# Patient Record
Sex: Male | Born: 1960 | Race: White | Hispanic: No | Marital: Single | Smoking: Never smoker
Health system: Southern US, Community
[De-identification: ages and names within clinical notes are randomized; demographics above are authoritative.]

## PROBLEM LIST (undated history)

## (undated) DIAGNOSIS — F32A Depression, unspecified: Secondary | ICD-10-CM

## (undated) DIAGNOSIS — I10 Essential (primary) hypertension: Secondary | ICD-10-CM

## (undated) DIAGNOSIS — F329 Major depressive disorder, single episode, unspecified: Secondary | ICD-10-CM

## (undated) DIAGNOSIS — R351 Nocturia: Secondary | ICD-10-CM

## (undated) DIAGNOSIS — N529 Male erectile dysfunction, unspecified: Secondary | ICD-10-CM

## (undated) DIAGNOSIS — J069 Acute upper respiratory infection, unspecified: Secondary | ICD-10-CM

## (undated) DIAGNOSIS — I639 Cerebral infarction, unspecified: Secondary | ICD-10-CM

## (undated) HISTORY — DX: Male erectile dysfunction, unspecified: N52.9

## (undated) HISTORY — DX: Acute upper respiratory infection, unspecified: J06.9

## (undated) HISTORY — PX: SPINE SURGERY: SHX786

## (undated) HISTORY — DX: Nocturia: R35.1

## (undated) HISTORY — DX: Major depressive disorder, single episode, unspecified: F32.9

## (undated) HISTORY — PX: APPENDECTOMY: SHX54

## (undated) HISTORY — DX: Essential (primary) hypertension: I10

## (undated) HISTORY — DX: Depression, unspecified: F32.A

---

## 1999-10-29 ENCOUNTER — Encounter: Admission: RE | Admit: 1999-10-29 | Discharge: 1999-10-29 | Payer: Self-pay | Admitting: Orthopedic Surgery

## 1999-10-29 ENCOUNTER — Encounter: Payer: Self-pay | Admitting: Orthopedic Surgery

## 2002-03-21 ENCOUNTER — Encounter: Payer: Self-pay | Admitting: Sports Medicine

## 2002-03-21 ENCOUNTER — Ambulatory Visit (HOSPITAL_COMMUNITY): Admission: RE | Admit: 2002-03-21 | Discharge: 2002-03-21 | Payer: Self-pay | Admitting: Sports Medicine

## 2002-04-01 ENCOUNTER — Encounter: Payer: Self-pay | Admitting: Neurological Surgery

## 2002-04-01 ENCOUNTER — Ambulatory Visit (HOSPITAL_COMMUNITY): Admission: RE | Admit: 2002-04-01 | Discharge: 2002-04-02 | Payer: Self-pay | Admitting: Neurological Surgery

## 2005-01-18 ENCOUNTER — Emergency Department (HOSPITAL_COMMUNITY): Admission: EM | Admit: 2005-01-18 | Discharge: 2005-01-18 | Payer: Self-pay | Admitting: Family Medicine

## 2006-11-20 ENCOUNTER — Encounter: Admission: RE | Admit: 2006-11-20 | Discharge: 2006-11-20 | Payer: Self-pay | Admitting: Family Medicine

## 2008-01-31 ENCOUNTER — Emergency Department (HOSPITAL_COMMUNITY): Admission: EM | Admit: 2008-01-31 | Discharge: 2008-01-31 | Payer: Self-pay | Admitting: Emergency Medicine

## 2008-02-06 ENCOUNTER — Encounter: Admission: RE | Admit: 2008-02-06 | Discharge: 2008-02-06 | Payer: Self-pay | Admitting: Sports Medicine

## 2008-02-17 ENCOUNTER — Ambulatory Visit (HOSPITAL_COMMUNITY): Admission: RE | Admit: 2008-02-17 | Discharge: 2008-02-17 | Payer: Self-pay | Admitting: Neurosurgery

## 2008-07-19 ENCOUNTER — Observation Stay (HOSPITAL_COMMUNITY): Admission: EM | Admit: 2008-07-19 | Discharge: 2008-07-20 | Payer: Self-pay | Admitting: Emergency Medicine

## 2008-07-20 ENCOUNTER — Encounter: Payer: Self-pay | Admitting: Family Medicine

## 2009-01-09 ENCOUNTER — Ambulatory Visit: Payer: Self-pay | Admitting: Family Medicine

## 2009-01-09 DIAGNOSIS — I10 Essential (primary) hypertension: Secondary | ICD-10-CM | POA: Insufficient documentation

## 2009-01-09 DIAGNOSIS — R351 Nocturia: Secondary | ICD-10-CM | POA: Insufficient documentation

## 2009-01-09 HISTORY — DX: Nocturia: R35.1

## 2009-01-09 HISTORY — DX: Essential (primary) hypertension: I10

## 2009-01-09 LAB — CONVERTED CEMR LAB
Bilirubin Urine: NEGATIVE
Glucose, Urine, Semiquant: NEGATIVE
Protein, U semiquant: NEGATIVE
Specific Gravity, Urine: 1.02
Urobilinogen, UA: 0.2
WBC Urine, dipstick: NEGATIVE

## 2009-01-10 LAB — CONVERTED CEMR LAB: PSA: 1.72 ng/mL (ref 0.10–4.00)

## 2009-01-17 ENCOUNTER — Telehealth (INDEPENDENT_AMBULATORY_CARE_PROVIDER_SITE_OTHER): Payer: Self-pay | Admitting: *Deleted

## 2009-01-27 ENCOUNTER — Ambulatory Visit: Payer: Self-pay | Admitting: Family Medicine

## 2009-01-27 DIAGNOSIS — N529 Male erectile dysfunction, unspecified: Secondary | ICD-10-CM

## 2009-01-27 HISTORY — DX: Male erectile dysfunction, unspecified: N52.9

## 2009-02-01 LAB — CONVERTED CEMR LAB
ALT: 22 units/L (ref 0–53)
AST: 21 units/L (ref 0–37)
Alkaline Phosphatase: 51 units/L (ref 39–117)
Basophils Relative: 0 % (ref 0–1)
Creatinine, Ser: 1.25 mg/dL (ref 0.40–1.50)
HDL: 35 mg/dL — ABNORMAL LOW (ref >39)
Indirect Bilirubin: 1 mg/dL — ABNORMAL HIGH (ref 0.0–0.9)
Lymphocytes Relative: 14 % (ref 12–46)
Monocytes Absolute: 0.5 10*3/uL (ref 0.1–1.0)
Neutro Abs: 3.7 10*3/uL (ref 1.7–7.7)
Platelets: 239 10*3/uL (ref 150–400)
Potassium: 3.8 meq/L (ref 3.5–5.3)
Sodium: 140 meq/L (ref 135–145)
Total CHOL/HDL Ratio: 4.4
Total Protein: 6.8 g/dL (ref 6.0–8.3)
Triglycerides: 126 mg/dL (ref <150)
VLDL: 25 mg/dL (ref 0–40)
WBC: 5 10*3/uL (ref 4.0–10.5)

## 2009-02-02 ENCOUNTER — Ambulatory Visit: Payer: Self-pay | Admitting: Family Medicine

## 2009-02-02 LAB — CONVERTED CEMR LAB
BUN: 14 mg/dL (ref 6–23)
CO2: 30 meq/L (ref 19–32)
Calcium: 9.4 mg/dL (ref 8.4–10.5)
Creatinine, Ser: 1.2 mg/dL (ref 0.4–1.5)
GFR calc non Af Amer: 68.21 mL/min (ref >60)
Glucose, Bld: 82 mg/dL (ref 70–99)
Potassium: 3.8 meq/L (ref 3.5–5.1)

## 2009-05-22 ENCOUNTER — Telehealth: Payer: Self-pay | Admitting: *Deleted

## 2010-03-02 ENCOUNTER — Ambulatory Visit: Payer: Self-pay | Admitting: Family Medicine

## 2010-03-02 LAB — CONVERTED CEMR LAB
ALT: 29 units/L (ref 0–53)
Albumin: 4.4 g/dL (ref 3.5–5.2)
Alkaline Phosphatase: 52 units/L (ref 39–117)
Basophils Absolute: 0 10*3/uL (ref 0.0–0.1)
CO2: 31 meq/L (ref 19–32)
Calcium: 9.5 mg/dL (ref 8.4–10.5)
Chloride: 106 meq/L (ref 96–112)
Cholesterol: 169 mg/dL (ref 0–200)
Eosinophils Relative: 2.7 % (ref 0.0–5.0)
Glucose, Bld: 93 mg/dL (ref 70–99)
HDL: 43 mg/dL (ref >39.00)
Hemoglobin, Urine: NEGATIVE
Hemoglobin: 16.8 g/dL (ref 13.0–17.0)
Ketones, ur: NEGATIVE mg/dL
Leukocytes, UA: NEGATIVE
Lymphs Abs: 0.6 10*3/uL — ABNORMAL LOW (ref 0.7–4.0)
MCV: 92.4 fL (ref 78.0–100.0)
Monocytes Absolute: 0.8 10*3/uL (ref 0.1–1.0)
Nitrite: NEGATIVE
Potassium: 4.6 meq/L (ref 3.5–5.1)
RDW: 13.6 % (ref 11.5–14.6)
Sodium: 143 meq/L (ref 135–145)
Specific Gravity, Urine: 1.02 (ref 1.000–1.030)
Total Protein: 6.9 g/dL (ref 6.0–8.3)
Triglycerides: 178 mg/dL — ABNORMAL HIGH (ref 0.0–149.0)
Urobilinogen, UA: 0.2 (ref 0.0–1.0)
VLDL: 35.6 mg/dL (ref 0.0–40.0)

## 2010-03-22 ENCOUNTER — Ambulatory Visit: Payer: Self-pay | Admitting: Family Medicine

## 2010-03-22 DIAGNOSIS — J069 Acute upper respiratory infection, unspecified: Secondary | ICD-10-CM | POA: Insufficient documentation

## 2010-03-22 HISTORY — DX: Acute upper respiratory infection, unspecified: J06.9

## 2010-03-26 ENCOUNTER — Telehealth: Payer: Self-pay | Admitting: Family Medicine

## 2010-03-27 ENCOUNTER — Telehealth: Payer: Self-pay | Admitting: Family Medicine

## 2010-04-10 ENCOUNTER — Ambulatory Visit: Payer: Self-pay | Admitting: Family Medicine

## 2010-05-02 ENCOUNTER — Ambulatory Visit: Payer: Self-pay | Admitting: Family Medicine

## 2010-10-04 NOTE — Progress Notes (Signed)
Summary: has? about med Viagra  Phone Note Call from Patient Call back at Home Phone [phone]   Caller: Patient--live call Summary of Call: has ? about his med. please return call. Initial call taken by: Warnell Forester,  March 26, 2010 10:03 AM  Follow-up for Phone Call        Pt called back and still is req the nurse call him today re: questions concerning some of his meds.  Follow-up by: Lucy Antigua,  March 26, 2010 4:46 PM  Additional Follow-up for Phone Call Additional follow up Details #1::        Pt calling requesting only 2 Viagra, #4 ordered early this month has not been filled yet, too expensive.  Called #2 to pharmacy at pt request, cancel #4 order Additional Follow-up by: Sid Falcon LPN,  March 26, 2010 4:59 PM    Prescriptions: VIAGRA 100 MG TABS (SILDENAFIL CITRATE) one by mouth once daily as needed  #2 x 0   Entered by:   Sid Falcon LPN   Authorized by:   Evelena Peat MD   Signed by:   Sid Falcon LPN on 09/81/1914   Method used:   Electronically to        [facility] [phone][facility] (retail)       [address]       [place], [place]  [address]       Ph: [phone] or [phone]       Fax: [phone]   RxID:   [id number]

## 2010-10-04 NOTE — Assessment & Plan Note (Signed)
Summary: painful glands//ccm   Vital Signs:  Patient profile:   50 year old male Temp:     98 degrees F oral BP sitting:   110 / 80  (left arm) Cuff size:   regular  Vitals Entered By: Sid Falcon LPN (March 22, 2010 3:55 PM)  History of Present Illness: Patient seen with painful anterior cervical adenopathy for about 5-6 days and symptoms have resolved today. Also some associated mild headaches. No nasal congestion or cough. Not aware of any fevers. Denied sore throat.  Hypertension and needs refills of medications. Has complete physical exam next week  Past History:  Past Medical History: Last updated: 02/22/2009 Hypertension Depression PMH reviewed for relevance  Review of Systems  The patient denies anorexia, fever, weight loss, hoarseness, chest pain, prolonged cough, and headaches.    Physical Exam  General:  Well-developed,well-nourished,in no acute distress; alert,appropriate and cooperative throughout examination Ears:  External ear exam shows no significant lesions or deformities.  Otoscopic examination reveals clear canals, tympanic membranes are intact bilaterally without bulging, retraction, inflammation or discharge. Hearing is grossly normal bilaterally. Mouth:  Oral mucosa and oropharynx without lesions or exudates.  Teeth in good repair. Neck:  No deformities, masses, or tenderness noted. Lungs:  Normal respiratory effort, chest expands symmetrically. Lungs are clear to auscultation, no crackles or wheezes. Cervical Nodes:  No lymphadenopathy noted   Impression & Recommendations:  Problem # 1:  VIRAL URI (ICD-465.9) Assessment New this appears to be resolving on its own  Problem # 2:  HYPERTENSION (ICD-401.9) Assessment: Unchanged  His updated medication list for this problem includes:    Chlorthalidone 25 Mg Tabs (Chlorthalidone) ..... Once daily    Lisinopril 40 Mg Tabs (Lisinopril) ..... Once daily  Complete Medication List: 1)  Chlorthalidone  25 Mg Tabs (Chlorthalidone) .... Once daily 2)  Lisinopril 40 Mg Tabs (Lisinopril) .... Once daily 3)  Viagra 100 Mg Tabs (Sildenafil citrate) .... One by mouth once daily as needed  Patient Instructions: 1)  Call or touch base if you have any fever or recurrent cervical adenopathy Prescriptions: LISINOPRIL 40 MG TABS (LISINOPRIL) once daily  #90 x 3   Entered and Authorized by:   Evelena Peat MD   Signed by:   Evelena Peat MD on 03/22/2010   Method used:   [facility]ron[facility] to        [facility] [phone][facility] (retail)       [address]       [place], [place]  [address]       Ph: [phone] or [phone]       Fax: [phone]   RxID:   [id number] CHLORTHALIDONE 25 MG TABS (CHLORTHALIDONE) once daily  #90 x 3   Entered and Authorized by:   Evelena Peat MD   Signed by:   Evelena Peat MD on 03/22/2010   Method used:   Electronically to        [facility] [phone][facility] (retail)       [address]       [place], [place]  [address]       Ph: [phone] or [phone]       Fax: [phone]   RxID:   [id number]

## 2010-10-04 NOTE — Progress Notes (Signed)
Summary: Pt his script for Viagra. Req script be called in again.   Phone Note Call from Patient Call back at [facility] Phone [phone]   Caller: Patient Summary of Call: Pt lost script for viagra. [facility]s call in to [facility] [address]    Initial call taken by: Lucy Antigua,  March 27, 2010 4:52 PM  Follow-up for Phone Call        Pt did pick up his 2 requested pills, had his truck serviced and they disappeared.  Pt now requesting #4.  Rx sent with refills Follow-up by: Sid Falcon LPN,  March 28, 2010 10:33 AM    Prescriptions: VIAGRA 100 MG TABS (SILDENAFIL CITRATE) one by mouth once daily as needed  #4 x 3   Entered by:   Sid Falcon LPN   Authorized by:   Evelena Peat MD   Signed by:   Sid Falcon LPN on 87/56/4332   Method used:   Electronically to        [facility] [phone][facility] (retail)       [address]       [place], [place]  [address]       Ph: [phone] or [phone]       Fax: [phone]   RxID:   [id number]

## 2010-10-04 NOTE — Assessment & Plan Note (Signed)
Summary: cough/chest congestion/swollen glands/sore throat/cjr   Vital Signs:  Patient profile:   50 year old male Weight:      192 pounds Temp:     98.8 degrees F oral BP sitting:   120 / 90  (left arm) Cuff size:   large  Vitals Entered By: Kathrynn Speed CMA (May 02, 2010 1:39 PM) CC: cough , chest congestion, x 1 day,swollen glands, sore throat, x 2 days,src   History of Present Illness: Acute visit. One-day history of cough, nasal congestion, sore throat, and intermittent headaches. No fever. Generalized malaise. Tylenol helps. Has not taken any cough medications. Cough occasionally productive of yellow sputum. No sick contacts.  Current Medications (verified): 1)  Chlorthalidone 25 Mg Tabs (Chlorthalidone) .... Once Daily 2)  Lisinopril 40 Mg Tabs (Lisinopril) .... Once Daily 3)  Cialis 20 Mg Tabs (Tadalafil) .... One By Mouth Every Other Day As Needed  Allergies (verified): No Known Drug Allergies  Past History:  Past Medical History: Last updated: 02/22/2009 Hypertension Depression  Review of Systems      See HPI  Physical Exam  General:  Well-developed,well-nourished,in no acute distress; alert,appropriate and cooperative throughout examination Ears:  External ear exam shows no significant lesions or deformities.  Otoscopic examination reveals clear canals, tympanic membranes are intact bilaterally without bulging, retraction, inflammation or discharge. Hearing is grossly normal bilaterally. Nose:  External nasal examination shows no deformity or inflammation. Nasal mucosa are pink and moist without lesions or exudates. Mouth:  minimal erythema without exudate Neck:  No deformities, masses, or tenderness noted. Lungs:  Normal respiratory effort, chest expands symmetrically. Lungs are clear to auscultation, no crackles or wheezes. Heart:  Normal rate and regular rhythm. S1 and S2 normal without gallop, murmur, click, rub or other extra sounds.   Impression &  Recommendations:  Problem # 1:  VIRAL URI (ICD-465.9)  rapid strep negative.  Cough medication for symptomatic relief  His updated medication list for this problem includes:    Hydrocodone-homatropine 5-1.5 Mg/24ml Syrp (Hydrocodone-homatropine) ..... One tsp by mouth q 4-6 hours as needed cough  Complete Medication List: 1)  Chlorthalidone 25 Mg Tabs (Chlorthalidone) .... Once daily 2)  Lisinopril 40 Mg Tabs (Lisinopril) .... Once daily 3)  Cialis 20 Mg Tabs (Tadalafil) .... One by mouth every other day as needed 4)  Hydrocodone-homatropine 5-1.5 Mg/30ml Syrp (Hydrocodone-homatropine) .... One tsp by mouth q 4-6 hours as needed cough  Other Orders: Rapid Strep (81191)  Patient Instructions: 1)  Get plenty of rest, drink lots of clear liquids, and use Tylenol or Ibuprofen for fever and comfort. Return in 7-10 days if you're not better: sooner if you'er feeling worse.  Prescriptions: HYDROCODONE-HOMATROPINE 5-1.5 MG/5ML SYRP (HYDROCODONE-HOMATROPINE) one tsp by mouth q 4-6 hours as needed cough  #120 ml x 0   Entered and Authorized by:   Evelena Peat MD   Signed by:   Evelena Peat MD on 05/02/2010   Method used:   Print then Give to Patient   RxID:   [id number]   Laboratory Results    Other Tests  Rapid Strep: negative Comments: Rita Ohara  May 02, 2010 1:59 PM   Kit Test Internal QC: Negative   (Normal Range: Negative)

## 2010-10-04 NOTE — Assessment & Plan Note (Signed)
Summary: cpx/ccm/pt rescd///ccm pt rsc/njr   Vital Signs:  Patient profile:   50 year old male Height:      68.75 inches Weight:      195 pounds BMI:     29.11 Temp:     98.0 degrees F oral Pulse rate:   80 / minute Pulse rhythm:   regular Resp:     12 per minute BP sitting:   130 / 88  (left arm) Cuff size:   regular  Vitals Entered By: Sid Falcon LPN (April 10, 2010 10:25 AM)  Nutrition Counseling: Patient's BMI is greater than 25 and therefore counseled on weight management options. CC: CPX   History of Present Illness: Here for CPE.  History of hypertension and erectile dysfunction. Medications recently refilled. Blood pressure had been well-controlled. No dizziness or other side effects. Would like to try Cialis for ED issues.  Prior history of prostate nodule biopsy 2007 normal. PSA last year 1.72.  Family history reviewed. Father had coronary disease in his late 110s.  Patient had tetanus last year. Colonoscopy 2005 and normal. No history of smoking.  Clinical Review Panels:  Prevention   Last Colonoscopy:  normal (09/03/2003)   Last PSA:  3.01 (03/02/2010)  Immunizations   Last Tetanus Booster:  Historical (09/02/2008)  Lipid Management   Cholesterol:  169 (03/02/2010)   LDL (bad choesterol):  90 (03/02/2010)   HDL (good cholesterol):  43.00 (03/02/2010)  Diabetes Management   Creatinine:  1.1 (03/02/2010)  CBC   WBC:  5.6 (03/02/2010)   RBC:  5.17 (03/02/2010)   Hgb:  16.8 (03/02/2010)   Hct:  47.8 (03/02/2010)   Platelets:  215.0 (03/02/2010)   MCV  92.4 (03/02/2010)   MCHC  35.2 (03/02/2010)   RDW  13.6 (03/02/2010)   PMN:  72.0 (03/02/2010)   Lymphs:  11.1 (03/02/2010)   Monos:  13.8 (03/02/2010)   Eosinophils:  2.7 (03/02/2010)   Basophil:  0.4 (03/02/2010)  Complete Metabolic Panel   Glucose:  93 (03/02/2010)   Sodium:  143 (03/02/2010)   Potassium:  4.6 (03/02/2010)   Chloride:  106 (03/02/2010)   CO2:  31 (03/02/2010)   BUN:   18 (03/02/2010)   Creatinine:  1.1 (03/02/2010)   Albumin:  4.4 (03/02/2010)   Total Protein:  6.9 (03/02/2010)   Calcium:  9.5 (03/02/2010)   Total Bili:  1.2 (03/02/2010)   Alk Phos:  52 (03/02/2010)   SGPT (ALT):  29 (03/02/2010)   SGOT (AST):  24 (03/02/2010)   Allergies (verified): No Known Drug Allergies  Past History:  Past Medical History: Last updated: 02/22/2009 Hypertension Depression  Past Surgical History: Last updated: 01/27/2009 Appendectomy 1982 Lumbar laminectomy  2006, 2009  Family History: Last updated: 01/09/2009 Heart disease, parent Family History Hypertension parent  Social History: Last updated: 01/09/2009 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    Physical Exam  General:  Well-developed,well-nourished,in no acute distress; alert,appropriate and cooperative throughout examination Head:  Normocephalic and atraumatic without obvious abnormalities. No apparent alopecia or balding. Eyes:  No corneal or conjunctival inflammation noted. EOMI. Perrla. Funduscopic exam benign,  without hemorrhages, exudates or papilledema. Vision grossly normal. Ears:  External ear exam shows no significant lesions or deformities.  Otoscopic examination reveals clear canals, tympanic membranes are intact bilaterally without bulging, retraction, inflammation or discharge. Hearing is grossly normal bilaterally. Mouth:   Oral mucosa and oropharynx without lesions or exudates.  Teeth in good repair. Neck:  No deformities, masses, or tenderness noted. Lungs:  Normal respiratory effort, chest expands symmetrically. Lungs are clear to auscultation, no crackles or wheezes. Heart:  Normal rate and regular rhythm. S1 and S2 normal without gallop, murmur, click, rub or other extra sounds. Abdomen:  Bowel sounds positive,abdomen soft and non-tender without masses, organomegaly or hernias noted. Rectal:  No external abnormalities noted. Normal sphincter tone. No rectal masses or tenderness. Prostate:  Prostate gland firm and smooth, no enlargement, nodularity, tenderness, mass, asymmetry or induration. Msk:  No deformity or scoliosis noted of thoracic or lumbar spine.   Extremities:  No clubbing, cyanosis, edema, or deformity noted with normal full range of motion of all joints.   Neurologic:  alert & oriented X3, cranial nerves II-XII intact, strength normal in all extremities, and gait normal.   Skin:  no rashes and no suspicious lesions.   Cervical Nodes:  No lymphadenopathy noted Psych:  normally interactive, good eye contact, not anxious appearing, and not depressed appearing.     Impression & Recommendations:  Problem # 1:  Preventive Health Care (ICD-V70.0) labs reviewed with patient. PSA is high end of range for his age an increased from last year. Prostate exam relatively normal. Repeat PSA 6 months and if elevating at that time consider referral to urologist for further evaluation  Problem # 2:  IMPOTENCE OF ORGANIC ORIGIN (ICD-607.84) trial of Cialis per pt request. His updated medication list for this problem includes:    Cialis 20 Mg Tabs (Tadalafil) ..... One by mouth every other day as needed  Complete Medication List: 1)  Chlorthalidone 25 Mg Tabs (Chlorthalidone) .... Once daily 2)  Lisinopril 40 Mg Tabs (Lisinopril) .... Once daily 3)  Cialis 20 Mg Tabs (Tadalafil) .... One by mouth every other  day as needed  Patient Instructions: 1)  It is important that you exercise reguarly at least 20 minutes 5 times a week. If you develop chest pain, have severe difficulty breathing, or feel very tired, stop exercising immediately and seek medical attention.  2)  Repeat PSA in 6 months 790.93 Prescriptions: CIALIS 20 MG TABS (TADALAFIL) one by mouth every other day as needed  #4 x 11   Entered and Authorized by:   Evelena Peat MD   Signed by:   Evelena Peat MD on 04/10/2010   Method used:   [facility] to        [facility] [phone][facility] (retail)       [address]       [place], [place]  [address]       Ph: [phone] or [phone]       Fax: [phone]   RxID:   [id number]

## 2010-10-09 ENCOUNTER — Other Ambulatory Visit: Payer: Self-pay

## 2010-12-25 ENCOUNTER — Other Ambulatory Visit: Payer: Self-pay | Admitting: Family Medicine

## 2011-01-15 NOTE — Op Note (Signed)
Jesse Pacheco, Jesse Pacheco             ACCOUNT NO.:  [id number]   MEDICAL RECORD NO.:  [id number]          PATIENT TYPE:  OIB   LOCATION:  3524                         FACILITY:  [facility]   PHYSICIAN:  Donalee Citrin, M.D.        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   PROCEDURE:  Extraforaminal approach to L3-4 diskectomy with  microdissection of the left L3 nerve root microscopic diskectomy.   SURGEON:  Donalee Citrin, M.D.   ASSISTANT:  Dr. Yetta Barre   HISTORY OF PRESENT ILLNESS:  The patient is a 50 year old gentleman who  has progressive worsening back and left leg pain bringing down to his  knee and his anterior quad.  The patient has weakness of his quadriceps  preoperatively.  MRI scan showing significant external disk herniation  at that level.  Given patient's preoperative weakness and MRI findings,  the patient was recommended extraforaminal diskectomy at that level.  Risks and benefits of the operation were explained to the patient, and  he understands and agreed to proceed forward.   PROCEDURE IN DETAIL:  The patient was brought to the OR and was induced  under general anesthesia, positioned prone on the Wilson frame.  The  patient was brought to the OR and was induced under general anesthesia,  positioned prone on the Wilson frame. Back was prepped in the usual  sterile fashion. Preop incision was localized at the appropriate level.  After infiltration of 10 mL of lidocaine with epinephrine, a midline  incision was made, and Bovie electrocautery was used to take down into  the subperiosteum.  Dissection was carried down the lamina at L3-L4.  An  intraoperative x-ray confirmed localization of the L3 and T3 pedicle.  So, then working in the external space the lateral aspect of the pars  and superior  aspect of the 3, 4 facets was drilled down.  A  Penfield  port was used to dissect underneath the pars and underneath the superior  facet 3, 4 and then with 2-mm Kerrison punch this was all under bitten  exposing the intertransverse ligament was removed in piecemeal fashion  and the L3 nerve root was identified.  At this point, the operative  microscope was draped and brought into the field.  Under microscopic  illumination, the L3 nerve root was further dissected out.  The ligament  was removed from overlying the nerve root, working underneath the nerve  root in a triangle above the superior facet and lateral pars.  The disk  herniation was immediately palpated with a 4 Penfield.  Epidural veins  were coagulated.  The L3 nerve root was reflected superiorly.  Annulotomy was made with the scalpel and nerve hook was used to tease  out a very large disk herniation in the external space.  Then, the  annulotomy was extended.  Working underneath the nerve proximally, some  additional fragments were immediately expressed and brought up into the  suction.  Then, the disk space was cleaned out.  Once adequate  diskectomy had been performed, the L3 nerve was completely decompressed,  explored with a coronary dilator, end of hockey stick and confirmed no  more compression.  The wound was then copiously irrigated.  Meticulous  hemostasis was maintained.  Gelfoam was overlaid on top of the L3 nerve  and extraforaminal  compartment.  The wound was closed in layers with interrupted Vicryl and  the skin was closed with running 4-0 subcuticular.  Benzoin and Steri-  Strips were applied.  The patient went to the recovery room in a stable  condition.  At the end of the case, initial sponge counts were correct.           ______________________________  Donalee Citrin, M.D.     GC/MEDQ  D:  02/17/2008  T:  02/18/2008  Job:  [id number]
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
## 2011-01-18 NOTE — Op Note (Signed)
NAME:  Jesse Pacheco, Jesse Pacheco                       ACCOUNT NO.:  [id number]   MEDICAL RECORD NO.:  [id number]                   PATIENT TYPE:  OIB   LOCATION:  3005                                 FACILITY:  [facility]   PHYSICIAN:  Stefani Dama, M.D.               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   OPERATION PERFORMED:  L2-3 microendoscopic extraforaminal diskectomy on the  left with microscope and microdissection technique.   SURGEON:  Stefani Dama, M.D.   ASSISTANT:  Tanya Nones. Jeral Fruit, MD   ANESTHESIA:  General endotracheal.   INDICATIONS FOR PROCEDURE:  The patient is a 50 year old individual who has  had a left L2 radiculopathy of significance for the past month.  He has been  unable to get comfortable despite the use of oral steroids.  He is advised  regarding surgical decompression.   DESCRIPTION OF PROCEDURE:  The patient was brought to the operating room  supine on a stretcher.  After smooth induction of general endotracheal  anesthesia, he was turned prone, the back was shaved, prepped with DuraPrep  and draped in a sterile fashion.  Using fluoroscopic localization of the L2-  L3 region, a K-wire was placed to the outer edge of the lamina of L2.  Then  using a wanding technique, a series of dilators was passed over the K-wire  to open up this area to the 18 mm diameter cannula and allow placement of an  18 mm wide by 6 cm deep endoscopic cannula.  This was fixed to the operating  table with a clamp.  The operating microscope was brought into the field and  then the fascial tissues over the lateral aspect of the laminar arch of L2  in the region of the pars  was identified.  A high speed air drill was used  to remove the outer portion of the pars and disarticulate the ligament  overlying this area.  The L2 nerve root was identified in its exit zone.  Laterally, it was noted to be tented over significant mass.  Then the  inferior aspect was dissected by removing the superior portion of the facet  joint of L2-3.  Epidural veins in this area were cauterized and divided  using microdissection technique.  Then with the help of Dr. Jeral Fruit, who  provided adequate retraction at L2 nerve root, the undersurface was  dissected free using microdissection technique.  Sweeping under the nerve  root, several fragments of disk were encountered.  These were removed.  There was still, however, some residual fullness.  It was felt that  dissection over the dorsal aspect  of the nerve superiorly would need to be  undertaken.  In this area there were significant epidural arteries and  veins, some of which ran to the nerve root themselves.  These were dissected  as best as possible; however, ligation and division of them was difficult  because of their tortuosity.  With gradual mobilization, the dorsal surface  of the nerve could be exposed.  Then by using a nerve hook and a Advertising copywriter, the dorsal aspect was explored.  No other fragments of disk were  encountered.  In the end, hemostasis from the dorsal aspect of the nerve was  obtained cautiously and carefully with several packings of Gelfoam soaked in  thrombin and some judicious use of bipolar cautery.  The nerve root in the  region of the dorsal root entry zone was protected as best possible during  this procedure.  The undersurface of the nerve was again explored and no  other fragments of disk were encountered.  In the end a good decompression  of the nerve root was had out to the far lateral aspect of the  extraforaminal space.  The area was copiously irrigated with antibiotic  irrigating solution.   The disk space itself was not entered as no rent in  the posterior longitudinal ligament along the extraforaminal zone were  identified.  The procedure was then completed by removing the endoscopic  cannula, closing the fascia with a single 3-0 Vicryl suture and then closing  the skin with 3-0 subcuticular suture.                                               Stefani Dama, M.D.    Merla Riches  D:  04/01/2002  T:  04/07/2002  Job:  [id number]

## 2011-02-12 ENCOUNTER — Ambulatory Visit (INDEPENDENT_AMBULATORY_CARE_PROVIDER_SITE_OTHER): Payer: 59 | Admitting: Family Medicine

## 2011-02-12 ENCOUNTER — Encounter: Payer: Self-pay | Admitting: Family Medicine

## 2011-02-12 VITALS — BP 130/100 | HR 72 | Temp 98.2°F | Resp 12 | Ht 69.0 in | Wt 194.0 lb

## 2011-02-12 DIAGNOSIS — F411 Generalized anxiety disorder: Secondary | ICD-10-CM

## 2011-02-12 DIAGNOSIS — F419 Anxiety disorder, unspecified: Secondary | ICD-10-CM

## 2011-02-12 MED ORDER — LORAZEPAM 0.5 MG PO TABS: 0.5000 mg | ORAL_TABLET | Freq: Three times a day (TID) | ORAL | Status: AC

## 2011-02-12 NOTE — Progress Notes (Signed)
  Subjective:    Patient ID: Jesse Pacheco, male    DOB: 07/27/1961, 50 y.o.   MRN: [id number]  HPI Patient seen with increased stress. About one week ago he broke up with fianc and they've been together for over a year. He had problems with decreased sleep since then with only now 2 hours per night. Very poor appetite. Occasional vomiting. No abdominal pain. Increased fatigue. Feels weak overall and shaky at times. Tried over-the-counter medications such as Benadryl about relief. No suicidal ideation.   Review of Systems  Constitutional: Positive for activity change and fatigue.  Respiratory: Negative for shortness of breath.   Cardiovascular: Negative for chest pain.  Psychiatric/Behavioral: Positive for sleep disturbance and dysphoric mood. Negative for suicidal ideas, confusion and self-injury. The patient is nervous/anxious.        Objective:   Physical Exam  Constitutional:       Patient somewhat anxious in appearance but appropriate in conversation  HENT:  Head: Normocephalic and atraumatic.  Cardiovascular: Normal rate, regular rhythm and normal heart sounds.   Pulmonary/Chest: Effort normal and breath sounds normal. No respiratory distress. He has no wheezes.          Assessment & Plan:  Anxiety with situational stressor. Short-term use of lorazepam 0.5 mg 1 or 2 each bedtime when necessary.   Engage in exercise and hobbies as much as possible. Name given for a couple of local counselors and he strongly suggest to start that soon.   Reassess in 2 weeks and sooner prn.

## 2011-02-18 ENCOUNTER — Ambulatory Visit: Payer: 59 | Admitting: Licensed Clinical Social Worker

## 2011-02-27 ENCOUNTER — Ambulatory Visit: Payer: 59 | Admitting: Family Medicine

## 2011-03-29 ENCOUNTER — Telehealth: Payer: Self-pay | Admitting: Family Medicine

## 2011-03-29 MED ORDER — LISINOPRIL 40 MG PO TABS: 40.0000 mg | ORAL_TABLET | Freq: Every day | ORAL | Status: DC

## 2011-03-29 MED ORDER — CHLORTHALIDONE 25 MG PO TABS: 25.0000 mg | ORAL_TABLET | Freq: Every day | ORAL | Status: DC

## 2011-03-29 NOTE — Telephone Encounter (Signed)
Pt requesting refill on Chorthalidone 25mg  and losenapril 40mg   [facility] [address]

## 2011-03-29 NOTE — Telephone Encounter (Signed)
Rx sent to new pharmacy, pt informed on VM

## 2011-04-01 ENCOUNTER — Other Ambulatory Visit: Payer: Self-pay | Admitting: Family Medicine

## 2011-04-02 ENCOUNTER — Encounter: Payer: Self-pay | Admitting: Family Medicine

## 2011-04-05 ENCOUNTER — Telehealth: Payer: Self-pay | Admitting: Family Medicine

## 2011-04-05 MED ORDER — SILDENAFIL CITRATE 100 MG PO TABS: 100.0000 mg | ORAL_TABLET | ORAL | Status: DC | PRN

## 2011-04-05 NOTE — Telephone Encounter (Signed)
Pt requesting refill on VIAGRA 100 MG tablet  [facility] [address]

## 2011-05-30 LAB — BASIC METABOLIC PANEL
BUN: 24 — ABNORMAL HIGH
Chloride: 99
Creatinine, Ser: 1.31
GFR calc Af Amer: >60
Glucose, Bld: 104 — ABNORMAL HIGH
Potassium: 4.5

## 2011-05-30 LAB — CBC
Hemoglobin: 17.2 — ABNORMAL HIGH
MCHC: 35
MCV: 87.4
RBC: 5.64
RDW: 13.1

## 2011-06-04 LAB — COMPREHENSIVE METABOLIC PANEL
Alkaline Phosphatase: 63
CO2: 29
Calcium: 9.5
Creatinine, Ser: 1.17
GFR calc Af Amer: >60
GFR calc non Af Amer: >60
Glucose, Bld: 106 — ABNORMAL HIGH
Potassium: 3.7
Total Bilirubin: 0.8

## 2011-10-27 ENCOUNTER — Encounter (HOSPITAL_COMMUNITY): Payer: Self-pay | Admitting: *Deleted

## 2011-10-27 ENCOUNTER — Emergency Department (HOSPITAL_COMMUNITY)
Admission: EM | Admit: 2011-10-27 | Discharge: 2011-10-27 | Disposition: A | Discharge status: Home / Self Care | Payer: 59 | Attending: Emergency Medicine | Admitting: Emergency Medicine

## 2011-10-27 ENCOUNTER — Other Ambulatory Visit: Payer: Self-pay

## 2011-10-27 ENCOUNTER — Emergency Department (HOSPITAL_COMMUNITY): Payer: 59

## 2011-10-27 DIAGNOSIS — M25519 Pain in unspecified shoulder: Secondary | ICD-10-CM | POA: Insufficient documentation

## 2011-10-27 DIAGNOSIS — R6884 Jaw pain: Secondary | ICD-10-CM | POA: Insufficient documentation

## 2011-10-27 DIAGNOSIS — R51 Headache: Secondary | ICD-10-CM | POA: Insufficient documentation

## 2011-10-27 DIAGNOSIS — R000 Tachycardia, unspecified: Secondary | ICD-10-CM | POA: Insufficient documentation

## 2011-10-27 DIAGNOSIS — I10 Essential (primary) hypertension: Secondary | ICD-10-CM | POA: Insufficient documentation

## 2011-10-27 DIAGNOSIS — R110 Nausea: Secondary | ICD-10-CM | POA: Insufficient documentation

## 2011-10-27 DIAGNOSIS — M542 Cervicalgia: Secondary | ICD-10-CM | POA: Insufficient documentation

## 2011-10-27 LAB — POCT I-STAT TROPONIN I
Troponin i, poc: 0 ng/mL (ref 0.00–0.08)
Troponin i, poc: 0 ng/mL (ref 0.00–0.08)

## 2011-10-27 LAB — D-DIMER, QUANTITATIVE: D-Dimer, Quant: <0.22 ug/mL-FEU (ref 0.00–0.48)

## 2011-10-27 LAB — BASIC METABOLIC PANEL
CO2: 25 mEq/L (ref 19–32)
Chloride: 97 mEq/L (ref 96–112)
Creatinine, Ser: 1.08 mg/dL (ref 0.50–1.35)
GFR calc Af Amer: 89 mL/min — ABNORMAL LOW (ref >90)
Potassium: 4.3 mEq/L (ref 3.5–5.1)

## 2011-10-27 LAB — CBC
MCV: 87.2 fL (ref 78.0–100.0)
Platelets: 196 10*3/uL (ref 150–400)
RBC: 5.72 MIL/uL (ref 4.22–5.81)
WBC: 12 10*3/uL — ABNORMAL HIGH (ref 4.0–10.5)

## 2011-10-27 MED ORDER — ASPIRIN 81 MG PO CHEW
324.0000 mg | CHEWABLE_TABLET | Freq: Once | ORAL | Status: AC
  Administered 2011-10-27: 81 mg via ORAL
  Filled 2011-10-27: qty 4

## 2011-10-27 MED ORDER — ACETAMINOPHEN 325 MG PO TABS
ORAL_TABLET | ORAL | Status: AC
  Filled 2011-10-27: qty 3

## 2011-10-27 MED ORDER — ONDANSETRON HCL 4 MG PO TABS: 4.0000 mg | ORAL_TABLET | Freq: Four times a day (QID) | ORAL | Status: AC

## 2011-10-27 MED ORDER — ONDANSETRON HCL 4 MG/2ML IJ SOLN
4.0000 mg | Freq: Once | INTRAMUSCULAR | Status: AC
  Administered 2011-10-27: 4 mg via INTRAVENOUS
  Filled 2011-10-27: qty 2

## 2011-10-27 MED ORDER — NITROGLYCERIN 0.4 MG SL SUBL
0.4000 mg | SUBLINGUAL_TABLET | SUBLINGUAL | Status: DC | PRN
  Administered 2011-10-27: 0.4 mg via SUBLINGUAL
  Filled 2011-10-27: qty 25

## 2011-10-27 MED ORDER — SODIUM CHLORIDE 0.9 % IV BOLUS (SEPSIS)
1000.0000 mL | Freq: Once | INTRAVENOUS | Status: AC
  Administered 2011-10-27: 1000 mL via INTRAVENOUS

## 2011-10-27 MED ORDER — MORPHINE SULFATE 4 MG/ML IJ SOLN
4.0000 mg | Freq: Once | INTRAMUSCULAR | Status: AC
  Administered 2011-10-27: 4 mg via INTRAVENOUS
  Filled 2011-10-27: qty 1

## 2011-10-27 MED ORDER — ACETAMINOPHEN 500 MG PO TABS
1000.0000 mg | ORAL_TABLET | ORAL | Status: DC
  Filled 2011-10-27: qty 2

## 2011-10-27 NOTE — Discharge Instructions (Signed)
Chest Pain (Nonspecific) It is often hard to give a specific diagnosis for the cause of chest pain. There is always a chance that your pain could be related to something serious, such as a heart attack or a blood clot in the lungs. You need to follow up with your caregiver for further evaluation. CAUSES   Heartburn.   Pneumonia or bronchitis.   Anxiety and stress.   Inflammation around your heart (pericarditis) or lung (pleuritis or pleurisy).   A blood clot in the lung.   A collapsed lung (pneumothorax). It can develop suddenly on its own (spontaneous pneumothorax) or from injury (trauma) to the chest.  The chest wall is composed of bones, muscles, and cartilage. Any of these can be the source of the pain.  The bones can be bruised by injury.   The muscles or cartilage can be strained by coughing or overwork.   The cartilage can be affected by inflammation and become sore (costochondritis).  DIAGNOSIS  Lab tests or other studies, such as X-rays, an EKG, stress testing, or cardiac imaging, may be needed to find the cause of your pain.  TREATMENT   Treatment depends on what may be causing your chest pain. Treatment may include:   Acid blockers for heartburn.   Anti-inflammatory medicine.   Pain medicine for inflammatory conditions.   Antibiotics if an infection is present.   You may be advised to change lifestyle habits. This includes stopping smoking and avoiding caffeine and chocolate.   You may be advised to keep your head raised (elevated) when sleeping. This reduces the chance of acid going backward from your stomach into your esophagus.   Most of the time, nonspecific chest pain will improve within 2 to 3 days with rest and mild pain medicine.  HOME CARE INSTRUCTIONS   If antibiotics were prescribed, take the full amount even if you start to feel better.   For the next few days, avoid physical activities that bring on chest pain. Continue physical activities as  directed.   Do not smoke cigarettes or drink alcohol until your symptoms are gone.   Only take over-the-counter or prescription medicine for pain, discomfort, or fever as directed by your caregiver.   Follow your caregiver's suggestions for further testing if your chest pain does not go away.   Keep any follow-up appointments you made. If you do not go to an appointment, you could develop lasting (chronic) problems with pain. If there is any problem keeping an appointment, you must call to reschedule.  SEEK MEDICAL CARE IF:   You think you are having problems from the medicine you are taking. Read your medicine instructions carefully.   Your chest pain does not go away, even after treatment.   You develop a rash with blisters on your chest.  SEEK IMMEDIATE MEDICAL CARE IF:   You have increased chest pain or pain that spreads to your arm, neck, jaw, back, or belly (abdomen).   You develop shortness of breath, an increasing cough, or you are coughing up blood.   You have severe back or abdominal pain, feel sick to your stomach (nauseous) or throw up (vomit).   You develop severe weakness, fainting, or chills.   You have an oral temperature above 102 F (38.9 C), not controlled by medicine.  THIS IS AN EMERGENCY. Do not wait to see if the pain will go away. Get medical help at once. Call your local emergency services (911 in [place] Do not drive yourself to   the hospital. MAKE SURE YOU:   Understand these instructions.   Will watch your condition.   Will get help right away if you are not doing well or get worse.  Document Released: 05/29/2005 Document Revised: 05/01/2011 Document Reviewed: 03/24/2008 [employer] Patient Information 2012 [employer]. 

## 2011-10-27 NOTE — ED Notes (Signed)
Vomiting pt medicated,md notified

## 2011-10-27 NOTE — ED Provider Notes (Signed)
History     CSN: [id number]  Arrival date & time 10/27/11  1419   First MD Initiated Contact with Patient 10/27/11 1502      Chief Complaint  Patient presents with  . Tachycardia    (Consider location/radiation/quality/duration/timing/severity/associated sxs/prior treatment) HPI History provided by the patient.  51 year old male with a history of hypertension presenting with complaint of headache, neck and shoulder discomfort, and tachycardia. Patient states the symptoms began one to 2 hours prior to ED arrival. He initially had a "discomfort" in his anterior bilateral head. Next he noted some anterior neck, jaw, and right shoulder are "heaviness" which was gradual onset, constant, without radiation, without similar symptoms in the past.  Soon after this he noted nausea. 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  Patient reports improvement of his tachycardia with rest but no appreciated alleviating or worsening factors of his other symptoms.  Pain 4/10 in severity.  Patient states he has been off his lisinopril for about 3 days and running out of this. Patient denies associated fever, cough, or vomiting.  No associated weakness, numbness, or tingling.   Past Medical History  Diagnosis Date  . HYPERTENSION 01/09/2009  . VIRAL URI 03/22/2010  . 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 Nocturia 01/09/2009  . Depression     Past Surgical History  Procedure Date  . Appendectomy   . Spine surgery 2006, 2009    laminectomy    Family History  Problem Relation Age of Onset  . Hypertension Mother   . Heart disease Father   . Hypertension Father     History  Substance Use Topics  . Smoking status: Never Smoker   . Smokeless tobacco: Not on file  . Alcohol Use: Yes     drinks 3-4 times a week      Review of Systems  Constitutional: Negative for fever, chills and diaphoresis.  HENT: Positive  for neck pain. Negative for congestion, sore throat and rhinorrhea.   Eyes: Negative for pain and visual disturbance.  Respiratory: Negative for cough and shortness of breath.   Cardiovascular: Positive for chest pain (Pt initially reporting upper chest heaviness related to his neck/jaw/Rt shoulder pain, then states that is in less in his chest.). Negative for palpitations.  Gastrointestinal: Positive for nausea. Negative for vomiting, abdominal pain and diarrhea.  Genitourinary: Negative for dysuria and hematuria.  Musculoskeletal: Negative for back pain and gait problem.       Right shoulder pain   Skin: Negative for rash and wound.  Neurological: Positive for headaches. Negative for dizziness.  Psychiatric/Behavioral: Negative for confusion and agitation.  All other systems reviewed and are negative.    Allergies  Review of patient's allergies indicates no known allergies.  Home Medications   Current Outpatient Rx  Name Route Sig Dispense Refill  . CHLORTHALIDONE 25 MG PO TABS Oral Take 25 mg by mouth daily.    Marland Kitchen LISINOPRIL 40 MG PO TABS Oral Take 40 mg by mouth daily.    . ADULT MULTIVITAMIN W/MINERALS CH Oral Take 1 tablet by mouth daily.      BP 132/91  Pulse 130  Temp(Src) 97.1 F (36.2 C) (Oral)  Resp 22  SpO2 94%  Physical Exam  Nursing note and vitals reviewed. Constitutional: He is oriented to person, place, and time. He appears well-developed and well-nourished. No distress.  HENT:  Head: Normocephalic and atraumatic.  Right  Ear: External ear normal.  Left Ear: External ear normal.  Nose: Nose normal.  Mouth/Throat: Oropharynx is clear and moist.  Eyes: Conjunctivae and EOM are normal. Pupils are equal, round, and reactive to light.  Neck: Normal range of motion. Neck supple.  Cardiovascular: Normal rate, regular rhythm and intact distal pulses.   No murmur heard. Pulmonary/Chest: Effort normal and breath sounds normal. No respiratory distress.  Abdominal:  Soft. Bowel sounds are normal. He exhibits no distension. There is no tenderness.  Musculoskeletal: Normal range of motion. He exhibits no edema.  Neurological: He is alert and oriented to person, place, and time.  Skin: Skin is dry. No rash noted. He is not diaphoretic.  Psychiatric: He has a normal mood and affect. Judgment normal.    ED Course  Procedures (including critical care time)   Date: 10/27/2011  Rate: 97  Rhythm: normal sinus rhythm  QRS Axis: normal  Intervals: normal  ST/T Wave abnormalities: normal  Conduction Disutrbances: none Poor R progression  Old EKG Reviewed: no prior for comparison   Labs Reviewed  CBC - Abnormal; Notable for the following:    WBC 12.0 (*)    Hemoglobin 18.4 (*)    MCHC 36.9 (*)    All other components within normal limits  BASIC METABOLIC PANEL - Abnormal; Notable for the following:    Glucose, Bld 103 (*)    GFR calc non Af Amer 77 (*)    GFR calc Af Amer 89 (*)    All other components within normal limits  D-DIMER, QUANTITATIVE  POCT I-STAT TROPONIN I  TSH   Dg Chest 2 View  10/27/2011  *RADIOLOGY REPORT*  Clinical Data: 51 year old male with pain, nausea and tachycardia.  CHEST - 2 VIEW  Comparison: 07/20/2008  Findings: The cardiomediastinal silhouette is unremarkable. The lungs are clear. There is no evidence of focal airspace disease, pulmonary edema, suspicious pulmonary nodule/mass, pleural effusion, or pneumothorax. No acute bony abnormalities are identified. Remote left rib fractures are present.  IMPRESSION: No evidence of active cardiopulmonary disease.  Original Report Authenticated By: Rosendo Gros, M.D.     1. Nausea   2. Anterior neck pain       MDM  51 y.o. M with h/o HTN off lisinopril x ~3 days presenting with HA, neck/shoulder heaviness, and nausea.  No infectious or neuro Sx's.  Also c/o tachy to 130s.  Exam as above, AF, initially tachy to 130s and 105 on initial exam; no other sig PE findings.  C/f  ACS.  PE less likely but will obtain ddimer.  Nitro x 1 given with improved neck/shoulder pain to 2/10 but worse HA.  Pt vomited x 1.  Bolus and morphine ordered.  Lab, CXR, and ASA ordered.   Pt now reporting that he drank C4 extreme with an ingredient similar to Ephedra (with 130mg  caffeine); possible Et of pt's tachy.  Ddimer and trop neg and CXR without acute path.   Delta trop also neg; pt now borderline tachy at ~100.  Will d/c pt with plan for PCP f/u tomorrow for possible outpt provocative cardiac testing.  Strict return precautions reviewed.        Particia Lather, MD 10/28/11 [phone]

## 2011-10-27 NOTE — ED Notes (Signed)
Patient reports onset of pressure in his chest and head and tachycardia approx 1.5 hours ago.  Patient denies cardiac hx.  He does report htn.

## 2011-10-28 ENCOUNTER — Other Ambulatory Visit (INDEPENDENT_AMBULATORY_CARE_PROVIDER_SITE_OTHER): Payer: 59

## 2011-10-28 DIAGNOSIS — Z0000 Encounter for general adult medical examination without abnormal findings: Secondary | ICD-10-CM

## 2011-10-28 LAB — CBC WITH DIFFERENTIAL/PLATELET
Basophils Relative: 0.1 % (ref 0.0–3.0)
Eosinophils Relative: 2.4 % (ref 0.0–5.0)
Lymphocytes Relative: 9 % — ABNORMAL LOW (ref 12.0–46.0)
Monocytes Absolute: 0.6 10*3/uL (ref 0.1–1.0)
Monocytes Relative: 13.8 % — ABNORMAL HIGH (ref 3.0–12.0)
Neutrophils Relative %: 74.7 % (ref 43.0–77.0)
Platelets: 190 10*3/uL (ref 150.0–400.0)
RBC: 5.04 Mil/uL (ref 4.22–5.81)
WBC: 4.5 10*3/uL (ref 4.5–10.5)

## 2011-10-28 LAB — BASIC METABOLIC PANEL
BUN: 19 mg/dL (ref 6–23)
Calcium: 8.3 mg/dL — ABNORMAL LOW (ref 8.4–10.5)
Chloride: 99 mEq/L (ref 96–112)
Creatinine, Ser: 1.1 mg/dL (ref 0.4–1.5)
GFR: 77.86 mL/min (ref >60.00)

## 2011-10-28 LAB — HEPATIC FUNCTION PANEL
ALT: 30 U/L (ref 0–53)
Alkaline Phosphatase: 49 U/L (ref 39–117)
Bilirubin, Direct: 0.2 mg/dL (ref 0.0–0.3)
Total Bilirubin: 1 mg/dL (ref 0.3–1.2)
Total Protein: 6.1 g/dL (ref 6.0–8.3)

## 2011-10-28 LAB — POCT URINALYSIS DIPSTICK
Bilirubin, UA: NEGATIVE
Ketones, UA: NEGATIVE
Leukocytes, UA: NEGATIVE
Protein, UA: NEGATIVE
Spec Grav, UA: 1.015
pH, UA: 6

## 2011-10-28 LAB — LIPID PANEL
HDL: 33.1 mg/dL — ABNORMAL LOW (ref >39.00)
Total CHOL/HDL Ratio: 4
Triglycerides: 89 mg/dL (ref 0.0–149.0)
VLDL: 17.8 mg/dL (ref 0.0–40.0)

## 2011-10-28 LAB — PSA: PSA: 1.77 ng/mL (ref 0.10–4.00)

## 2011-10-29 ENCOUNTER — Other Ambulatory Visit: Payer: 59

## 2011-10-30 NOTE — ED Provider Notes (Signed)
I saw and evaluated the patient, reviewed the resident's note and I agree with the findings and plan and agree with their ECG interpretation. Patient presents with  multiple complaints. also some tachycardia. Patient was found drink some stimulant drink early in the day. As a possible etiology for the tachycardia. D-dimer and laboratories negative. He'll followup with cardiology  Juliet Rude. Rubin Payor, MD 10/30/11 2348

## 2011-11-11 ENCOUNTER — Encounter: Payer: Self-pay | Admitting: Family Medicine

## 2011-11-11 ENCOUNTER — Ambulatory Visit (INDEPENDENT_AMBULATORY_CARE_PROVIDER_SITE_OTHER): Payer: 59 | Admitting: Family Medicine

## 2011-11-11 VITALS — BP 128/88 | HR 80 | Temp 98.5°F | Ht 69.0 in | Wt 200.0 lb

## 2011-11-11 DIAGNOSIS — Z0000 Encounter for general adult medical examination without abnormal findings: Secondary | ICD-10-CM

## 2011-11-11 DIAGNOSIS — R000 Tachycardia, unspecified: Secondary | ICD-10-CM

## 2011-11-11 DIAGNOSIS — G4700 Insomnia, unspecified: Secondary | ICD-10-CM

## 2011-11-11 DIAGNOSIS — M542 Cervicalgia: Secondary | ICD-10-CM

## 2011-11-11 MED ORDER — CLONAZEPAM 0.5 MG PO TABS: 0.5000 mg | ORAL_TABLET | Freq: Every evening | ORAL | Status: DC | PRN

## 2011-11-11 NOTE — Patient Instructions (Signed)

## 2011-11-11 NOTE — Progress Notes (Signed)
Subjective:    Patient ID: Jesse Pacheco, male    DOB: Aug 21, 1961, 51 y.o.   MRN: [id number]  HPI  Patient here for complete physical and evaluation for separate problems including transient insomnia and recent episode of tachyarrhythmia associated with neck pain and nausea but no chest pain. He has history of hypertension treated with chlorthalidone and lisinopril. Compliant with therapy. He had a recent episode of headache, neck pain, and nausea. This occurred a few hours after drinking an energy drink. He went to the emergency room and apparently had sinus tachycardia. Other evaluation negative. Patient denies any chest pain. No exertional symptoms. No recurrent symptoms since then. His father had bypass at age 28. Patient requesting referral for stress test. He is active with yard work but does not do any formal aerobic exercise. Nonsmoker. No regular alcohol use.  New issue of some recent insomnia. Denies depression. He thinks a lot of this is related to work stress. No excessive caffeine use. No alcohol use. He took one of his girlfriends clonazepam 0.5 mg which worked extremely well. He does not plan to use except for rare events with not sleeping well  Past Medical History  Diagnosis Date  . HYPERTENSION 01/09/2009  . VIRAL URI 03/22/2010  . 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 Nocturia 01/09/2009  . Depression    Past Surgical History  Procedure Date  . Appendectomy   . Spine surgery 2006, 2009    laminectomy    reports that he has never smoked. He does not have any smokeless tobacco history on file. He reports that he drinks alcohol. He reports that he does not use illicit drugs. family history includes Heart disease (age of onset:58) in his father; Hypertension in his mother; and Stroke in his mother. No Known Allergies    Review of Systems  Constitutional: Negative for fever, activity change, appetite change and fatigue.  HENT: Negative for ear pain, congestion and  trouble swallowing.   Eyes: Negative for pain and visual disturbance.  Respiratory: Negative for cough, shortness of breath and wheezing.   Cardiovascular: Negative for chest pain and palpitations.  Gastrointestinal: Negative for nausea, vomiting, abdominal pain, diarrhea, constipation, blood in stool, abdominal distention and rectal pain.  Genitourinary: Negative for dysuria, hematuria and testicular pain.  Musculoskeletal: Negative for joint swelling and arthralgias.  Skin: Negative for rash.  Neurological: Negative for dizziness, syncope and headaches.  Hematological: Negative for adenopathy.  Psychiatric/Behavioral: Negative for confusion and dysphoric mood.       Objective:   Physical Exam  Constitutional: He is oriented to person, place, and time. He appears well-developed and well-nourished. No distress.  HENT:  Head: Normocephalic and atraumatic.  Right Ear: External ear normal.  Left Ear: External ear normal.  Mouth/Throat: Oropharynx is clear and moist.  Eyes: Conjunctivae and EOM are normal. Pupils are equal, round, and reactive to light.  Neck: Normal range of motion. Neck supple. No thyromegaly present.  Cardiovascular: Normal rate, regular rhythm and normal heart sounds.   No murmur heard. Pulmonary/Chest: No respiratory distress. He has no wheezes. He has no rales.  Abdominal: Soft. Bowel sounds are normal. He exhibits no distension and no mass. There is no tenderness. There is no rebound and no guarding.  Musculoskeletal: He exhibits no edema.  Lymphadenopathy:    He has no cervical adenopathy.  Neurological: He is alert and oriented to person, place, and time. He displays normal reflexes. No cranial nerve deficit.  Skin: No rash noted.  Psychiatric:  He has a normal mood and affect.          Assessment & Plan:  #1 complete physical. Labs reviewed with patient and favorable with the exception of low HDL. Tetanus update. Colonoscopy 3 years ago. #2 transient  insomnia. Sleep hygiene discussed. When necessary use clonazepam 0.5 mg #30 with 1 refill #3 recent transient tachycardia probably related to consumption of energy drink. Positive family history of premature CAD. Patient requesting exercise tolerance test and we'll set up which seems reasonable related to his atypical neck pain and nausea symptoms

## 2011-11-22 ENCOUNTER — Telehealth: Payer: Self-pay | Admitting: *Deleted

## 2011-11-22 NOTE — Telephone Encounter (Signed)
Message copied by Melchor Amour on Fri Nov 22, 2011  3:56 PM ------      Message from: Ottis Stain      Created: Fri Nov 22, 2011  1:03 PM       Please let Dr. Caryl Never know that this pt cancelled his Exercise Tolerance Test.  Thanks, Terri            ----- Message -----         From: Gerome Sam, NT         Sent: 11/22/2011  12:32 PM           To: Terri Skains Slaugenhaupt, CCC-SLP      Subject: TEST                                                     11/22/11 Patient cancel his gxt

## 2011-11-25 ENCOUNTER — Encounter: Payer: 59 | Admitting: Physician Assistant

## 2012-02-11 ENCOUNTER — Other Ambulatory Visit: Payer: Self-pay | Admitting: *Deleted

## 2012-02-11 MED ORDER — LISINOPRIL 40 MG PO TABS: 40.0000 mg | ORAL_TABLET | Freq: Every day | ORAL | Status: DC

## 2012-02-11 MED ORDER — CHLORTHALIDONE 25 MG PO TABS: 25.0000 mg | ORAL_TABLET | Freq: Every day | ORAL | Status: DC

## 2012-02-27 ENCOUNTER — Other Ambulatory Visit: Payer: Self-pay | Admitting: *Deleted

## 2012-02-27 NOTE — Telephone Encounter (Signed)
Refill OK

## 2012-02-27 NOTE — Telephone Encounter (Signed)
Clonazepam 0.5 last filled 11-11-11, #30 with 1 refill  Take one po at HS prn anxiety

## 2012-02-28 MED ORDER — CLONAZEPAM 0.5 MG PO TABS: 0.5000 mg | ORAL_TABLET | Freq: Every evening | ORAL | Status: DC | PRN

## 2012-08-25 ENCOUNTER — Ambulatory Visit (INDEPENDENT_AMBULATORY_CARE_PROVIDER_SITE_OTHER): Payer: 59 | Admitting: Family Medicine

## 2012-08-25 ENCOUNTER — Encounter: Payer: Self-pay | Admitting: Family Medicine

## 2012-08-25 VITALS — BP 102/70 | Temp 98.0°F | Wt 193.0 lb

## 2012-08-25 DIAGNOSIS — F4321 Adjustment disorder with depressed mood: Secondary | ICD-10-CM

## 2012-08-25 MED ORDER — CLONAZEPAM 0.5 MG PO TABS: 0.5000 mg | ORAL_TABLET | Freq: Every evening | ORAL | Status: DC | PRN

## 2012-08-25 NOTE — Progress Notes (Signed)
  Subjective:    Patient ID: Jesse Pacheco, male    DOB: [birth year], 51 y.o.   MRN: [id number]  HPI Patient is seen to discuss anxiety and depression issues. 2 days ago his fiance abruptly announced that she was not interested in marriage. They had planned on getting married in February of 2014. He has been living with her for 2 years. This was quite a shock to him and had difficulty adjusting. He denies any suicidal ideation. He is having significant insomnia. Taken Klonopin in the past. He plans to pursue counseling through work. 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 He feels he would have great difficulty focusing  for his job. He feels very anxious. Has very supportive friends. No history of illicit drug use.   Review of Systems  Constitutional: Negative for appetite change and unexpected weight change.  Cardiovascular: Negative for chest pain.  Neurological: Negative for dizziness and headaches.  Psychiatric/Behavioral: Positive for sleep disturbance, dysphoric mood and decreased concentration. Negative for suicidal ideas, confusion and agitation. The patient is nervous/anxious.        Objective:   Physical Exam  Constitutional: He is oriented to person, place, and time. He appears well-developed and well-nourished.  Cardiovascular: Normal rate and regular rhythm.   Pulmonary/Chest: Effort normal and breath sounds normal. No respiratory distress. He has no wheezes. He has no rales.  Neurological: He is alert and oriented to person, place, and time.  Psychiatric: His behavior is normal. Judgment and thought content normal.       Depressed mood          Assessment & Plan:  Adjustment disorder with depressed mood. We have recommended counseling which he plans to pursue through his work. Refill Klonopin for nighttime use only. We'll plan FMLA leave from work for one month. We discussed possible  antidepressant use but at this point did not see a clear indication.

## 2012-09-15 ENCOUNTER — Other Ambulatory Visit (INDEPENDENT_AMBULATORY_CARE_PROVIDER_SITE_OTHER): Payer: 59

## 2012-09-15 DIAGNOSIS — Z0000 Encounter for general adult medical examination without abnormal findings: Secondary | ICD-10-CM

## 2012-09-15 LAB — POCT URINALYSIS DIPSTICK
Leukocytes, UA: NEGATIVE
Nitrite, UA: NEGATIVE
Protein, UA: NEGATIVE
Urobilinogen, UA: 1

## 2012-09-15 LAB — CBC WITH DIFFERENTIAL/PLATELET
Basophils Relative: 0.5 % (ref 0.0–3.0)
Eosinophils Absolute: 0.1 10*3/uL (ref 0.0–0.7)
Eosinophils Relative: 2.2 % (ref 0.0–5.0)
HCT: 45.4 % (ref 39.0–52.0)
Lymphs Abs: 1.1 10*3/uL (ref 0.7–4.0)
MCHC: 34.5 g/dL (ref 30.0–36.0)
MCV: 91.3 fl (ref 78.0–100.0)
Monocytes Absolute: 0.6 10*3/uL (ref 0.1–1.0)
Neutro Abs: 3.2 10*3/uL (ref 1.4–7.7)
RBC: 4.98 Mil/uL (ref 4.22–5.81)
WBC: 5 10*3/uL (ref 4.5–10.5)

## 2012-09-15 LAB — BASIC METABOLIC PANEL
BUN: 20 mg/dL (ref 6–23)
Calcium: 9.7 mg/dL (ref 8.4–10.5)
Creatinine, Ser: 1.1 mg/dL (ref 0.4–1.5)
GFR: 72.82 mL/min (ref >60.00)
Glucose, Bld: 102 mg/dL — ABNORMAL HIGH (ref 70–99)

## 2012-09-15 LAB — LIPID PANEL: VLDL: 23.2 mg/dL (ref 0.0–40.0)

## 2012-09-15 LAB — HEPATIC FUNCTION PANEL: Albumin: 4.2 g/dL (ref 3.5–5.2)

## 2012-09-15 LAB — TSH: TSH: 2.16 u[IU]/mL (ref 0.35–5.50)

## 2012-09-15 LAB — PSA: PSA: 7.8 ng/mL — ABNORMAL HIGH (ref 0.10–4.00)

## 2012-09-22 ENCOUNTER — Encounter: Payer: Self-pay | Admitting: Family Medicine

## 2012-09-22 ENCOUNTER — Ambulatory Visit (INDEPENDENT_AMBULATORY_CARE_PROVIDER_SITE_OTHER): Payer: 59 | Admitting: Family Medicine

## 2012-09-22 VITALS — BP 120/90 | HR 80 | Temp 98.1°F | Resp 12 | Ht 69.25 in | Wt 192.0 lb

## 2012-09-22 DIAGNOSIS — R972 Elevated prostate specific antigen [PSA]: Secondary | ICD-10-CM

## 2012-09-22 DIAGNOSIS — F4321 Adjustment disorder with depressed mood: Secondary | ICD-10-CM

## 2012-09-22 DIAGNOSIS — Z0000 Encounter for general adult medical examination without abnormal findings: Secondary | ICD-10-CM

## 2012-09-22 MED ORDER — SERTRALINE HCL 50 MG PO TABS: 50.0000 mg | ORAL_TABLET | Freq: Every day | ORAL | Status: DC

## 2012-09-22 NOTE — Progress Notes (Signed)
Subjective:    Patient ID: Jesse Pacheco, male    DOB: 1961-03-17, 52 y.o.   MRN: [id number]  HPI Here for complete physical. His hypertension treated with lisinopril and chlorthalidone. Recent sleep disturbance. Recent breakup with girlfriend. Refer to prior note. He's been to some counseling. Feels very anxious and depressed but no suicidal ideation. Low motivation. Difficulty concentrating. He has remained out of work and returns next week. He has very good support network. No prior history of depression.  Prior history of elevated PSA. Patient relates prostate biopsy about 4 years ago negative. Occasional slow stream. No recent burning with urination.  Tetanus 2010  Past Medical History  Diagnosis Date  . HYPERTENSION 01/09/2009  . VIRAL URI 03/22/2010  . 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 Nocturia 01/09/2009  . Depression    Past Surgical History  Procedure Date  . Appendectomy   . Spine surgery 2006, 2009    laminectomy    reports that he has never smoked. He does not have any smokeless tobacco history on file. He reports that he drinks alcohol. He reports that he does not use illicit drugs. family history includes Heart disease (age of onset:58) in his father; Hypertension in his mother; and Stroke in his mother. No Known Allergies    Review of Systems  Constitutional: Negative for fever, activity change, appetite change and fatigue.  HENT: Negative for ear pain, congestion and trouble swallowing.   Eyes: Negative for pain and visual disturbance.  Respiratory: Negative for cough, shortness of breath and wheezing.   Cardiovascular: Negative for chest pain and palpitations.  Gastrointestinal: Negative for nausea, vomiting, abdominal pain, diarrhea, constipation, blood in stool, abdominal distention and rectal pain.  Genitourinary: Negative for hematuria and testicular pain.  Musculoskeletal: Negative for joint swelling and arthralgias.  Skin: Negative for rash.    Neurological: Negative for dizziness, syncope and headaches.  Hematological: Negative for adenopathy.  Psychiatric/Behavioral: Positive for sleep disturbance and dysphoric mood. Negative for suicidal ideas, confusion, self-injury and agitation. The patient is nervous/anxious.        Objective:   Physical Exam  Constitutional: He is oriented to person, place, and time. He appears well-developed and well-nourished. No distress.  HENT:  Head: Normocephalic and atraumatic.  Right Ear: External ear normal.  Left Ear: External ear normal.  Mouth/Throat: Oropharynx is clear and moist.  Eyes: Conjunctivae normal and EOM are normal. Pupils are equal, round, and reactive to light.  Neck: Normal range of motion. Neck supple. No thyromegaly present.  Cardiovascular: Normal rate, regular rhythm and normal heart sounds.   No murmur heard. Pulmonary/Chest: No respiratory distress. He has no wheezes. He has no rales.  Abdominal: Soft. Bowel sounds are normal. He exhibits no distension and no mass. There is no tenderness. There is no rebound and no guarding.  Genitourinary:       Prostate is not enlarged and nontender. No specific nodules palpated  Musculoskeletal: He exhibits no edema.  Lymphadenopathy:    He has no cervical adenopathy.  Neurological: He is alert and oriented to person, place, and time. He displays normal reflexes. No cranial nerve deficit.  Skin: No rash noted.  Psychiatric: He has a normal mood and affect.          Assessment & Plan:  #1 complete physical. Labs reviewed. Tetanus up-to-date. Establish more consistent exercise  #2 elevated PSA. Significantly elevated since last year. Prior history of negative biopsy. Repeat PSA in 3 months. If still up at that point  consider referral back to urology  #3 adjustment disorder with depressed mood. Initiate sertraline 50 mg daily. Continue counseling. Reassess one month and sooner as needed

## 2012-09-24 ENCOUNTER — Telehealth: Payer: Self-pay | Admitting: Family Medicine

## 2012-09-24 NOTE — Telephone Encounter (Signed)
Okay to write note.

## 2012-09-24 NOTE — Telephone Encounter (Signed)
yes

## 2012-09-24 NOTE — Telephone Encounter (Signed)
Left message on machine letter ready for pick up

## 2012-09-24 NOTE — Telephone Encounter (Signed)
Pt need return to work note. Pt will be going back to work on 10-05-2012. Pt will pick  up note. Pt was on fmla from 08-28-12

## 2012-10-19 ENCOUNTER — Telehealth: Payer: Self-pay | Admitting: Family Medicine

## 2012-10-19 NOTE — Telephone Encounter (Signed)
Clonazepam last filled 08-25-12, #30 with 2 refills

## 2012-10-19 NOTE — Telephone Encounter (Signed)
Pt would like to have clonazePAM (KLONOPIN) 0.5 MG tablet refilled early (about 1 1/2 wk early, due to anxiety. This is only thing that helps. [facility] [address]

## 2012-10-20 ENCOUNTER — Telehealth: Payer: Self-pay | Admitting: Family Medicine

## 2012-10-20 MED ORDER — CLONAZEPAM 0.5 MG PO TABS: 0.5000 mg | ORAL_TABLET | Freq: Every evening | ORAL | Status: DC | PRN

## 2012-10-20 NOTE — Telephone Encounter (Signed)
Pharm said to please call and clear up directions so that patient can pick up early. Script must say it is ok for patient to take med more than prescribed before. Pt has increased dose and now is out of meds.

## 2012-10-20 NOTE — Telephone Encounter (Signed)
Rx called in, pt informed

## 2012-10-20 NOTE — Telephone Encounter (Signed)
Pt said  We must call pharm [facility] [place] and tell pharm  it is ok to refill klonopin early.

## 2012-10-20 NOTE — Telephone Encounter (Signed)
Refill once.  Do not exceed every 12 hour use.

## 2012-10-21 NOTE — Telephone Encounter (Signed)
Pt informed and Ray, pt pharmacist given verbal OK to fill early and change sig: to one every 12 hours prn

## 2012-10-21 NOTE — Telephone Encounter (Signed)
Make take up to one every 12 hours.  We will discuss other options.

## 2012-10-21 NOTE — Telephone Encounter (Signed)
I called in #30 with 0 refills yesterday.  Please clarify how this pt is to take Klonopin?

## 2012-10-23 ENCOUNTER — Ambulatory Visit: Payer: 59 | Admitting: Family Medicine

## 2012-10-26 ENCOUNTER — Ambulatory Visit: Payer: 59 | Admitting: Family Medicine

## 2013-01-04 ENCOUNTER — Ambulatory Visit: Payer: 59 | Admitting: Family Medicine

## 2013-01-04 DIAGNOSIS — Z0289 Encounter for other administrative examinations: Secondary | ICD-10-CM

## 2013-02-27 ENCOUNTER — Other Ambulatory Visit: Payer: Self-pay | Admitting: Family Medicine

## 2013-03-04 ENCOUNTER — Telehealth: Payer: Self-pay | Admitting: Family Medicine

## 2013-03-04 NOTE — Telephone Encounter (Signed)
PT would like to come in next week to have his PSA levels checked. There are no orders. Please assist.

## 2013-03-04 NOTE — Telephone Encounter (Signed)
Is it okay for me to order PSA for patient

## 2013-03-04 NOTE — Telephone Encounter (Signed)
Patient does have a future order for PSA.

## 2013-03-15 ENCOUNTER — Other Ambulatory Visit (INDEPENDENT_AMBULATORY_CARE_PROVIDER_SITE_OTHER): Payer: 59

## 2013-03-15 DIAGNOSIS — R972 Elevated prostate specific antigen [PSA]: Secondary | ICD-10-CM

## 2013-03-15 LAB — PSA: PSA: 6.09 ng/mL — ABNORMAL HIGH (ref 0.10–4.00)

## 2013-03-17 ENCOUNTER — Telehealth: Payer: Self-pay | Admitting: Family Medicine

## 2013-03-17 NOTE — Telephone Encounter (Signed)
Pt would like a call back w/ lab results done mon, 7/14

## 2013-03-17 NOTE — Telephone Encounter (Signed)
Pt aware of results 

## 2013-03-24 ENCOUNTER — Telehealth: Payer: Self-pay | Admitting: Family Medicine

## 2013-03-24 NOTE — Telephone Encounter (Signed)
Pt called and stated that he would like to speak directly with Dr. Caryl Never regarding his labs. Please assist.

## 2013-03-29 NOTE — Telephone Encounter (Signed)
I have left messages with patient both today and last week.

## 2013-03-31 ENCOUNTER — Telehealth: Payer: Self-pay | Admitting: Family Medicine

## 2013-03-31 NOTE — Telephone Encounter (Signed)
PT is requesting to speak with Dr. Caryl Never regarding his lab results. He's asking that Dr. Caryl Never call between 4:30-5:30pm. Please assist.

## 2013-04-04 NOTE — Telephone Encounter (Signed)
I called pt twice and left messages for him to call back and have not heard back.

## 2013-04-15 ENCOUNTER — Telehealth: Payer: Self-pay | Admitting: Family Medicine

## 2013-04-15 DIAGNOSIS — R972 Elevated prostate specific antigen [PSA]: Secondary | ICD-10-CM

## 2013-04-15 NOTE — Telephone Encounter (Signed)
Pt aware of the referral

## 2013-04-15 NOTE — Telephone Encounter (Signed)
Let pt know referral has been made

## 2013-04-15 NOTE — Telephone Encounter (Signed)
PT is calling to inquire about the status of his urology referral. Please assist.

## 2013-04-21 ENCOUNTER — Encounter: Payer: Self-pay | Admitting: Family Medicine

## 2013-07-08 ENCOUNTER — Telehealth: Payer: Self-pay | Admitting: Family Medicine

## 2013-07-08 MED ORDER — CHLORTHALIDONE 25 MG PO TABS: ORAL_TABLET | ORAL | Status: DC

## 2013-07-08 MED ORDER — LISINOPRIL 40 MG PO TABS: ORAL_TABLET | ORAL | Status: DC

## 2013-07-08 NOTE — Telephone Encounter (Signed)
Pt request refill of lisinopril (PRINIVIL,ZESTRIL) 40 MG tablet  1/ dayand  chlorthalidone (HYGROTON) 25 MG tablet 1/ day Both 90 day  [facility] [address]

## 2013-07-08 NOTE — Telephone Encounter (Signed)
Sent RXs to pharmacy 

## 2013-11-04 ENCOUNTER — Other Ambulatory Visit: Payer: Self-pay | Admitting: Family Medicine

## 2013-11-18 ENCOUNTER — Ambulatory Visit (INDEPENDENT_AMBULATORY_CARE_PROVIDER_SITE_OTHER)
Admission: RE | Admit: 2013-11-18 | Discharge: 2013-11-18 | Disposition: A | Discharge status: Home / Self Care | Payer: 59 | Source: Ambulatory Visit | Attending: Family Medicine | Admitting: Family Medicine

## 2013-11-18 ENCOUNTER — Encounter: Payer: Self-pay | Admitting: Family Medicine

## 2013-11-18 ENCOUNTER — Ambulatory Visit (INDEPENDENT_AMBULATORY_CARE_PROVIDER_SITE_OTHER): Payer: 59 | Admitting: Family Medicine

## 2013-11-18 VITALS — BP 128/90 | HR 85 | Wt 209.0 lb

## 2013-11-18 DIAGNOSIS — M7710 Lateral epicondylitis, unspecified elbow: Secondary | ICD-10-CM

## 2013-11-18 DIAGNOSIS — M79672 Pain in left foot: Secondary | ICD-10-CM

## 2013-11-18 DIAGNOSIS — M79609 Pain in unspecified limb: Secondary | ICD-10-CM

## 2013-11-18 MED ORDER — LISINOPRIL 40 MG PO TABS: 40.0000 mg | ORAL_TABLET | Freq: Every day | ORAL | Status: DC

## 2013-11-18 MED ORDER — CHLORTHALIDONE 25 MG PO TABS: 25.0000 mg | ORAL_TABLET | Freq: Every day | ORAL | Status: DC

## 2013-11-18 NOTE — Progress Notes (Signed)
   Subjective:    Patient ID: Jesse Pacheco, male    DOB: 10-09-1960, 53 y.o.   MRN: [id number]  Foot Pain Pertinent negatives include no chills, fever, numbness or weakness.   Seen for the following  Right elbow pain. Duration several weeks. No injury. Location is lateral elbow. Pain is moderate. Worse with gripping. Possibly mild swelling. He started icing recently which has helped somewhat. No weakness. No radiation of pain.  Left foot pain. Location metatarsophalangeal joint. Progressive pain over several months and possibly years. No history of gout. No history of erythema or warmth. He does take chlorthalidone. Denies any injury. Starting to have tremendous difficulty walking with any shoe wear.  Past Medical History  Diagnosis Date  . HYPERTENSION 01/09/2009  . VIRAL URI 03/22/2010  . 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 Nocturia 01/09/2009  . Depression    Past Surgical History  Procedure Laterality Date  . Appendectomy    . Spine surgery  2006, 2009    laminectomy    reports that he has never smoked. He does not have any smokeless tobacco history on file. He reports that he drinks alcohol. He reports that he does not use illicit drugs. family history includes Heart disease (age of onset: 258) in his father; Hypertension in his mother; Stroke in his mother. No Known Allergies    Review of Systems  Constitutional: Negative for fever and chills.  Neurological: Negative for weakness and numbness.  Hematological: Negative for adenopathy.       Objective:   Physical Exam  Constitutional: He appears well-developed and well-nourished.  Cardiovascular: Normal rate and regular rhythm.   Pulmonary/Chest: Effort normal and breath sounds normal. No respiratory distress. He has no wheezes. He has no rales.  Musculoskeletal: He exhibits no edema.  Right elbow reveals tenderness over the lateral epicondylar region. Full range of motion. Pain with wrist extension against  resistance but not with wrist flexion. No visible erythema.  Left foot reveals prominent hypertrophic bony changes MTP joint. No warmth or erythema. No effusion.          Assessment & Plan:  #1 right lateral epicondylitis. Continue icing and tennis elbow strap. Offered steroid injection if symptoms persist. Handout given #2 left foot pain. Metatarsophalangeal joint. 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 No history of gout. Start with x-rays. Consider podiatry referral

## 2013-11-18 NOTE — Progress Notes (Signed)
Pre visit review using our clinic review tool, if applicable. No additional management support is needed unless otherwise documented below in the visit note. 

## 2013-11-18 NOTE — Patient Instructions (Signed)
Tennis Elbow  Your caregiver has diagnosed you with a condition often referred to as "tennis elbow." This results from small tears or soreness (inflammation) at the start (origin) of the extensor muscles of the forearm. Although the condition is often called tennis or golfer's elbow, it is caused by any repetitive action performed by your elbow.  HOME CARE INSTRUCTIONS   If the condition has been short lived, rest may be the only treatment required. Using your opposite hand or arm to perform the task may help. Even changing your grip may help rest the extremity. These may even prevent the condition from recurring.   Longer standing problems, however, will often be relieved faster by:   Using anti-inflammatory agents.   Applying ice packs for 30 minutes at the end of the working day, at bed time, or when activities are finished.   Your caregiver may also have you wear a splint or sling. This will allow the inflamed tendon to heal.  At times, steroid injections aided with a local anesthetic will be required along with splinting for 1 to 2 weeks. Two to three steroid injections will often solve the problem. In some long standing cases, the inflamed tendon does not respond to conservative (non-surgical) therapy. Then surgery may be required to repair it.  MAKE SURE YOU:    Understand these instructions.   Will watch your condition.   Will get help right away if you are not doing well or get worse.  Document Released: 08/19/2005 Document Revised: 11/11/2011 Document Reviewed: 04/06/2008  Exit[employer] Patient Information 2014 [employer].

## 2013-11-30 ENCOUNTER — Ambulatory Visit: Payer: Self-pay | Admitting: Podiatry

## 2014-02-24 ENCOUNTER — Other Ambulatory Visit: Payer: Self-pay | Admitting: Family Medicine

## 2014-03-02 ENCOUNTER — Other Ambulatory Visit: Payer: Self-pay | Admitting: Family Medicine

## 2014-10-03 ENCOUNTER — Ambulatory Visit (INDEPENDENT_AMBULATORY_CARE_PROVIDER_SITE_OTHER): Payer: 59 | Admitting: Family Medicine

## 2014-10-03 ENCOUNTER — Encounter: Payer: Self-pay | Admitting: Family Medicine

## 2014-10-03 VITALS — BP 128/90 | HR 87 | Temp 97.9°F | Wt 206.0 lb

## 2014-10-03 DIAGNOSIS — M549 Dorsalgia, unspecified: Secondary | ICD-10-CM

## 2014-10-03 DIAGNOSIS — M25512 Pain in left shoulder: Secondary | ICD-10-CM

## 2014-10-03 MED ORDER — METHYLPREDNISOLONE ACETATE 40 MG/ML IJ SUSP
40.0000 mg | Freq: Once | INTRAMUSCULAR | Status: AC
  Administered 2014-10-03: 40 mg via INTRA_ARTICULAR

## 2014-10-03 NOTE — Progress Notes (Signed)
   Subjective:    Patient ID: Jesse Pacheco, male    DOB: [birth year], 54 y.o.   MRN: [id number]  HPI Patient complaint of left shoulder pain. On further questioning this is actually more medial to his left scapular region. Onset about 2 weeks ago. No specific injury. Pain is mostly constant. He tried ibuprofen along with heat and ice without much improvement. He thinks he had similar type process with trigger point in the past. He does not have any limitation of range of motion in the shoulder and no actual shoulder joint pain. Denies any cervical radiculopathy symptoms. No upper extremity numbness or weakness.  Past Medical History  Diagnosis Date  . HYPERTENSION 01/09/2009  . VIRAL URI 03/22/2010  . 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 Nocturia 01/09/2009  . Depression    Past Surgical History  Procedure Laterality Date  . Appendectomy    . Spine surgery  2006, 2009    laminectomy    reports that he has never smoked. He does not have any smokeless tobacco history on file. He reports that he drinks alcohol. He reports that he does not use illicit drugs. family history includes Heart disease (age of onset: 7358) in his father; Hypertension in his mother; Stroke in his mother. No Known Allergies    Review of Systems  Neurological: Negative for weakness and numbness.       Objective:   Physical Exam  Constitutional: He appears well-developed and well-nourished.  Cardiovascular: Normal rate and regular rhythm.   Pulmonary/Chest: Effort normal and breath sounds normal. No respiratory distress. He has no wheezes. He has no rales.  Musculoskeletal: He exhibits no edema.  He has a very localized area of tenderness just medial to the left scapular region. This is over area about 1 cm. No visible skin changes or swelling          Assessment & Plan:  Probable trigger point left upper back. He has tried heat and ice and ibuprofen without improvement. We discussed risk and benefits  of corticosteroid injection and patient consented. Skin prepped with Betadine. Using 25-gauge 5/8 inch needle injected 1 mL of Xylocaine and 1/2 mL of Depo-Medrol. Patient tolerated well. Touch base in 3-4 days if not improving

## 2014-10-03 NOTE — Progress Notes (Signed)
Pre visit review using our clinic review tool, if applicable. No additional management support is needed unless otherwise documented below in the visit note. 

## 2014-10-03 NOTE — Patient Instructions (Signed)
Trigger Point Injection Trigger points are areas where you have muscle pain. A trigger point injection is a shot given in the trigger point to relieve that pain. A trigger point might feel like a knot in your muscle. It hurts to press on a trigger point. Sometimes the pain spreads out (radiates) to other parts of the body. For example, pressing on a trigger point in your shoulder might cause pain in your arm or neck. You might have one trigger point. Or, you might have more than one. People often have trigger points in their upper back and lower back. They also occur often in the neck and shoulders. Pain from a trigger point lasts for a long time. It can make it hard to keep moving. You might not be able to do the exercise or physical therapy that could help you deal with the pain. A trigger point injection may help. It does not work for everyone. But, it may relieve your pain for a few days or a few months. A trigger point injection does not cure long-lasting (chronic) pain. LET YOUR CAREGIVER KNOW ABOUT:  Any allergies (especially to latex, lidocaine, or steroids).  Blood-thinning medicines that you take. These drugs can lead to bleeding or bruising after an injection. They include:  Aspirin.  Ibuprofen.  Clopidogrel.  Warfarin.  Other medicines you take. This includes all vitamins, herbs, eyedrops, over-the-counter medicines, and creams.  Use of steroids.  Recent infections.  Past problems with numbing medicines.  Bleeding problems.  Surgeries you have had.  Other health problems. RISKS AND COMPLICATIONS A trigger point injection is a safe treatment. However, problems may develop, such as:  Minor side effects usually go away in 1 to 2 days. These may include:  Soreness.  Bruising.  Stiffness.  More serious problems are rare. But, they may include:  Bleeding under the skin (hematoma).  Skin infection.  Breaking off of the needle under your skin.  Lung  puncture.  The trigger point injection may not work for you. BEFORE THE PROCEDURE You may need to stop taking any medicine that thins your blood. This is to prevent bleeding and bruising. Usually these medicines are stopped several days before the injection. No other preparation is needed. PROCEDURE  A trigger point injection can be given in your caregiver's office or in a clinic. Each injection takes 2 minutes or less.  Your caregiver will feel for trigger points. The caregiver may use a marker to circle the area for the injection.  The skin over the trigger point will be washed with a germ-killing (antiseptic) solution.  The caregiver pinches the spot for the injection.  Then, a very thin needle is used for the shot. You may feel pain or a twitching feeling when the needle enters the trigger point.  A numbing solution may be injected into the trigger point. Sometimes a drug to keep down swelling, redness, and warmth (inflammation) is also injected.  Your caregiver moves the needle around the trigger zone until the tightness and twitching goes away.  After the injection, your caregiver may put gentle pressure over the injection site.  Then it is covered with a bandage. AFTER THE PROCEDURE  You can go right home after the injection.  The bandage can be taken off after a few hours.  You may feel sore and stiff for 1 to 2 days.  Go back to your regular activities slowly. Your caregiver may ask you to stretch your muscles. Do not do anything that takes   extra energy for a few days.  Follow your caregiver's instructions to manage and treat other pain. Document Released: 08/08/2011 Document Revised: 12/14/2012 Document Reviewed: 08/08/2011 Exit[employer] Patient Information 2015 [employer]. This information is not intended to replace advice given to you by your health care provider. Make sure you discuss any questions you have with your health care provider.  

## 2014-10-10 ENCOUNTER — Telehealth: Payer: Self-pay | Admitting: Family Medicine

## 2014-10-10 NOTE — Telephone Encounter (Signed)
i would give him option of seeing Ayesha MohairZack Smith

## 2014-10-10 NOTE — Telephone Encounter (Signed)
Patient states Dr. Caryl NeverBurchette wanted to know how his shoulder was feeling after the shot.  Patient states the pain is getting worse and would like to know what to do next?

## 2014-10-11 ENCOUNTER — Other Ambulatory Visit: Payer: Self-pay | Admitting: Family Medicine

## 2014-10-11 DIAGNOSIS — M25512 Pain in left shoulder: Secondary | ICD-10-CM

## 2014-10-11 NOTE — Telephone Encounter (Signed)
Left message for patient to return call.

## 2014-10-11 NOTE — Telephone Encounter (Signed)
Pt is okay with seeing Jesse Pacheco. Referral is ordered.

## 2014-10-13 ENCOUNTER — Other Ambulatory Visit: Payer: Self-pay

## 2014-10-17 ENCOUNTER — Other Ambulatory Visit: Payer: Self-pay | Admitting: Family Medicine

## 2014-10-17 ENCOUNTER — Other Ambulatory Visit: Payer: Self-pay

## 2014-10-17 ENCOUNTER — Telehealth: Payer: Self-pay | Admitting: Family Medicine

## 2014-10-17 DIAGNOSIS — Z0000 Encounter for general adult medical examination without abnormal findings: Secondary | ICD-10-CM

## 2014-10-17 NOTE — Telephone Encounter (Addendum)
Pt had cpe labs this am but missed due to weather.  Pt has appt with Jesse Pacheco at [facility] next week.  Can you put in his cpx labs so he can get an [facility] same day as his appt over there? Pt has cpx w/ Jesse Pacheco 2/26 Thanks!

## 2014-10-17 NOTE — Telephone Encounter (Signed)
Labs are ordered 

## 2014-10-19 ENCOUNTER — Ambulatory Visit: Payer: 59 | Admitting: Family Medicine

## 2014-10-20 ENCOUNTER — Encounter: Payer: Self-pay | Admitting: Family Medicine

## 2014-10-24 ENCOUNTER — Ambulatory Visit (INDEPENDENT_AMBULATORY_CARE_PROVIDER_SITE_OTHER): Payer: 59 | Admitting: Family Medicine

## 2014-10-24 ENCOUNTER — Encounter: Payer: Self-pay | Admitting: Family Medicine

## 2014-10-24 ENCOUNTER — Other Ambulatory Visit (INDEPENDENT_AMBULATORY_CARE_PROVIDER_SITE_OTHER): Payer: 59

## 2014-10-24 VITALS — BP 132/82 | HR 73 | Ht 70.0 in | Wt 209.0 lb

## 2014-10-24 DIAGNOSIS — M7552 Bursitis of left shoulder: Secondary | ICD-10-CM

## 2014-10-24 DIAGNOSIS — Z0000 Encounter for general adult medical examination without abnormal findings: Secondary | ICD-10-CM

## 2014-10-24 DIAGNOSIS — M25512 Pain in left shoulder: Secondary | ICD-10-CM

## 2014-10-24 DIAGNOSIS — R7989 Other specified abnormal findings of blood chemistry: Secondary | ICD-10-CM

## 2014-10-24 DIAGNOSIS — M7550 Bursitis of unspecified shoulder: Secondary | ICD-10-CM | POA: Insufficient documentation

## 2014-10-24 LAB — CBC WITH DIFFERENTIAL/PLATELET
Basophils Absolute: 0 10*3/uL (ref 0.0–0.1)
Basophils Relative: 0.9 % (ref 0.0–3.0)
EOS ABS: 0.2 10*3/uL (ref 0.0–0.7)
EOS PCT: 4.9 % (ref 0.0–5.0)
HEMATOCRIT: 43.9 % (ref 39.0–52.0)
Hemoglobin: 15.7 g/dL (ref 13.0–17.0)
LYMPHS PCT: 29.3 % (ref 12.0–46.0)
Lymphs Abs: 1.1 10*3/uL (ref 0.7–4.0)
MCHC: 35.8 g/dL (ref 30.0–36.0)
MCV: 91.2 fl (ref 78.0–100.0)
MONO ABS: 0.4 10*3/uL (ref 0.1–1.0)
MONOS PCT: 11.7 % (ref 3.0–12.0)
NEUTROS PCT: 53.2 % (ref 43.0–77.0)
Neutro Abs: 2 10*3/uL (ref 1.4–7.7)
Platelets: 202 10*3/uL (ref 150.0–400.0)
RBC: 4.81 Mil/uL (ref 4.22–5.81)
RDW: 12.9 % (ref 11.5–15.5)
WBC: 3.8 10*3/uL — AB (ref 4.0–10.5)

## 2014-10-24 LAB — HEPATIC FUNCTION PANEL
ALBUMIN: 4.2 g/dL (ref 3.5–5.2)
ALT: 47 U/L (ref 0–53)
AST: 24 U/L (ref 0–37)
Alkaline Phosphatase: 58 U/L (ref 39–117)
BILIRUBIN TOTAL: 0.4 mg/dL (ref 0.2–1.2)
Bilirubin, Direct: 0.1 mg/dL (ref 0.0–0.3)
Total Protein: 6.5 g/dL (ref 6.0–8.3)

## 2014-10-24 LAB — BASIC METABOLIC PANEL
BUN: 24 mg/dL — ABNORMAL HIGH (ref 6–23)
CO2: 26 mEq/L (ref 19–32)
Calcium: 9.5 mg/dL (ref 8.4–10.5)
Chloride: 105 mEq/L (ref 96–112)
Creatinine, Ser: 1.04 mg/dL (ref 0.40–1.50)
GFR: 78.7 mL/min (ref >60.00)
GLUCOSE: 106 mg/dL — AB (ref 70–99)
Potassium: 4.8 mEq/L (ref 3.5–5.1)
Sodium: 140 mEq/L (ref 135–145)

## 2014-10-24 LAB — PSA: PSA: 2.04 ng/mL (ref 0.10–4.00)

## 2014-10-24 LAB — LDL CHOLESTEROL, DIRECT: Direct LDL: 108 mg/dL

## 2014-10-24 LAB — LIPID PANEL
CHOLESTEROL: 223 mg/dL — AB (ref 0–200)
HDL: 38.7 mg/dL — ABNORMAL LOW (ref >39.00)
NONHDL: 184.3
TRIGLYCERIDES: 313 mg/dL — AB (ref 0.0–149.0)
Total CHOL/HDL Ratio: 6
VLDL: 62.6 mg/dL — ABNORMAL HIGH (ref 0.0–40.0)

## 2014-10-24 LAB — TSH: TSH: 1.13 u[IU]/mL (ref 0.35–4.50)

## 2014-10-24 NOTE — Patient Instructions (Signed)
Very nice to meet you Ice 20 minutes 2 times daily. Usually after activity and before bed. Exercises 3 times a week.  Pennsaid twice daily as needed Turmeric 500mg  twice daily Vitamin D 2000 IU daily Keep hands with in vision with lifting See me again in 3 weeks.

## 2014-10-24 NOTE — Progress Notes (Signed)
Pre visit review using our clinic review tool, if applicable. No additional management support is needed unless otherwise documented below in the visit note. 

## 2014-10-24 NOTE — Assessment & Plan Note (Signed)
She was given an injection today. Patient did have some good resolution of pain today. We discussed icing and home exercises. We discussed the possibility of x-rays which patient declined. Patient continues to have pain we will get x-rays of the shoulder and neck. Patient did not have any neck related findings today. Patient did work with a Event organiserathletic trainer today in greater detail to learn home exercises. Patient will try to make these changes and come back and see me again in 3-4 weeks.

## 2014-10-24 NOTE — Progress Notes (Signed)
Tawana ScaleZach Smith D.O. [facility] [address] [place], [place] [address] Phone: [phone]) [phone] Subjective:    I'm seeing this patient by the request  of:  Kristian CoveyBURCHETTE,BRUCE W, MD   CC: Left shoulder pain  NFA:[id number]:Subjective Jesse Pacheco is a 54 y.o. male coming in with complaint of left shoulder pain. She has been having this pain for quite some time. Patient states his been multiple months. Patient was seen by primary care provider in a trigger point of the scapular region was found. Patient was given an injection in this trigger point with some minimal benefit. Patient states that the pain is increased and now seems to be more localized over the left shoulder. Patient states that sometimes he does have an some radiation down the arm. Denies any neck pain that seems to be associated with it. Denies any weakness. Still occluded do daily activities. Hurts more after a lot of activity. Mild pain with nighttime awakening.     Past medical history, social, surgical and family history all reviewed in electronic medical record.   Review of Systems: No headache, visual changes, nausea, vomiting, diarrhea, constipation, dizziness, abdominal pain, skin rash, fevers, chills, night sweats, weight loss, swollen lymph nodes, body aches, joint swelling, muscle aches, chest pain, shortness of breath, mood changes.   Objective Blood pressure 132/82, pulse 73, height 5\' 10"  (1.778 m), weight 209 lb (94.802 kg), SpO2 95 %.  General: No apparent distress alert and oriented x3 mood and affect normal, dressed appropriately.  HEENT: Pupils equal, extraocular movements intact  Respiratory: Patient's speak in full sentences and does not appear short of breath  Cardiovascular: No lower extremity edema, non tender, no erythema  Skin: Warm dry intact with no signs of infection or rash on extremities or on axial skeleton.  Abdomen: Soft nontender  Neuro: Cranial nerves II through XII are intact, neurovascularly  intact in all extremities with 2+ DTRs and 2+ pulses.  Lymph: No lymphadenopathy of posterior or anterior cervical chain or axillae bilaterally.  Gait normal with good balance and coordination.  MSK:  Non tender with full range of motion and good stability and symmetric strength and tone of  elbows, wrist, hip, knee and ankles bilaterally.  Shoulder: left Inspection reveals no abnormalities, atrophy or asymmetry. Palpation is normal with no tenderness over AC joint or bicipital groove. ROM is full in all planes passively. Rotator cuff strength normal throughout. signs of impingement with positive Neer and Hawkin's tests, but negative empty can sign. Speeds and Yergason's tests normal. No labral pathology noted with negative Obrien's, negative clunk and good stability. Normal scapular function observed. No painful arc and no drop arm sign. No apprehension sign  MSK US performed of: left This study was ordered, performed, and interpreted by Terrilee FilesZach Smith D.O.  Shoulder:   Supraspinatus:  Appears normal on long and transverse views, Bursal bulge seen with shoulder abduction on impingement view. Infraspinatus:  Appears normal on long and transverse views. Significant increase in Doppler flow Subscapularis:  Appears normal on long and transverse views. Positive bursa Teres Minor:  Appears normal on long and transverse views. AC joint:  Capsule undistended, no geyser sign. Glenohumeral Joint:  Appears normal without effusion. Glenoid Labrum:  Intact without visualized tears. Biceps Tendon:  Appears normal on long and transverse views, no fraying of tendon, tendon located in intertubercular groove, no subluxation with shoulder internal or external rotation.  Impression: Subacromial bursitis  Procedure: Real-time Ultrasound Guided Injection of left glenohumeral joint Device: GE Logiq  E  Ultrasound guided injection is preferred based studies that show increased duration, increased effect,  greater accuracy, decreased procedural pain, increased response rate with ultrasound guided versus blind injection.  Verbal informed consent obtained.  Time-out conducted.  Noted no overlying erythema, induration, or other signs of local infection.  Skin prepped in a sterile fashion.  Local anesthesia: Topical Ethyl chloride.  With sterile technique and under real time ultrasound guidance:  Joint visualized.  23g 1  inch needle inserted posterior approach. Pictures taken for needle placement. Patient did have injection of 2 cc of 1% lidocaine, 2 cc of 0.5% Marcaine, and 1.0 cc of Kenalog 40 mg/dL. Completed without difficulty  Pain immediately resolved suggesting accurate placement of the medication.  Advised to call if fevers/chills, erythema, induration, drainage, or persistent bleeding.  Images permanently stored and available for review in the ultrasound unit.  Impression: Technically successful ultrasound guided injection.     Impression and Recommendations:     This case required medical decision making of moderate complexity.

## 2014-10-28 ENCOUNTER — Encounter: Payer: Self-pay | Admitting: Family Medicine

## 2014-11-07 ENCOUNTER — Encounter: Payer: Self-pay | Admitting: Family Medicine

## 2014-11-14 ENCOUNTER — Ambulatory Visit (INDEPENDENT_AMBULATORY_CARE_PROVIDER_SITE_OTHER): Payer: 59 | Admitting: Family Medicine

## 2014-11-14 ENCOUNTER — Other Ambulatory Visit: Payer: Self-pay | Admitting: Family Medicine

## 2014-11-14 ENCOUNTER — Encounter: Payer: Self-pay | Admitting: Family Medicine

## 2014-11-14 ENCOUNTER — Ambulatory Visit (INDEPENDENT_AMBULATORY_CARE_PROVIDER_SITE_OTHER)
Admission: RE | Admit: 2014-11-14 | Discharge: 2014-11-14 | Disposition: A | Discharge status: Home / Self Care | Payer: 59 | Source: Ambulatory Visit | Attending: Family Medicine | Admitting: Family Medicine

## 2014-11-14 VITALS — BP 144/90 | HR 83 | Ht 70.0 in | Wt 204.0 lb

## 2014-11-14 DIAGNOSIS — M4322 Fusion of spine, cervical region: Secondary | ICD-10-CM

## 2014-11-14 DIAGNOSIS — M25512 Pain in left shoulder: Secondary | ICD-10-CM

## 2014-11-14 DIAGNOSIS — M7552 Bursitis of left shoulder: Secondary | ICD-10-CM

## 2014-11-14 MED ORDER — PREDNISONE 50 MG PO TABS: 50.0000 mg | ORAL_TABLET | Freq: Every day | ORAL | Status: DC

## 2014-11-14 MED ORDER — GABAPENTIN 100 MG PO CAPS: 200.0000 mg | ORAL_CAPSULE | Freq: Every day | ORAL | Status: DC

## 2014-11-14 NOTE — Patient Instructions (Addendum)
Good to see you Continue with the icing at night.  Prednisone daily for 5 days Gabapentin 100mg  at night and increase to 200mg  at night in 1 week Physical therapy will be calling you See me again in 4-6 weeks or call me sooner if things get worse.

## 2014-11-14 NOTE — Assessment & Plan Note (Signed)
Patient did not do significant well with the injection. There is concerned the cervical radiculopathy as playing a role. We discussed with patient about the possibility of this and x-rays of patient's shoulder and neck were taken today. We will await images. Patient will be sent to formal physical therapy. Patient will do a five-day dose of prednisone to see if this is more of a cervical radiculopathy as well as patient will be started on gabapentin at night. We discussed icing regimen. We discussed with patient that if he does not make any significant improvement advance imaging may be necessary. Patient will come back and see me again in 4 weeks for further evaluation and treatment.

## 2014-11-14 NOTE — Progress Notes (Signed)
Pre visit review using our clinic review tool, if applicable. No additional management support is needed unless otherwise documented below in the visit note. 

## 2014-11-14 NOTE — Progress Notes (Signed)
  Jesse ScaleZach Pacheco D.O. [facility] [address] Jesse Pacheco, Jesse Pacheco Phone: [phone]) Jesse Pacheco Subjective:    CC: Left shoulder pain follow up  BJY:[id number]:Subjective Jesse Pacheco is a 54 y.o. male coming in with complaint of left shoulder pain. She has been having this pain for quite some time. Patient states his been multiple months. Patient was seen one month ago and was diagnosed with more of a subacromial bursitis. Patient was given an injection, home exercises, icing protocol. Patient states he felt great for proximal wing one week in the pain center coming back. Patient describes it as a dull throbbing aching pain that is still consistent. States that seems to be worse with movement. Patient states it can be associated with neck pain.     Past medical history, social, surgical and family history all reviewed in electronic medical record.   Review of Systems: No headache, visual changes, nausea, vomiting, diarrhea, constipation, dizziness, abdominal pain, skin rash, fevers, chills, night sweats, weight loss, swollen lymph nodes, body aches, joint swelling, muscle aches, chest pain, shortness of breath, mood changes.   Objective Blood pressure 144/90, pulse 83, height 5\' 10"  (1.778 m), weight 204 lb (92.534 kg), SpO2 96 %.  General: No apparent distress alert and oriented x3 mood and affect normal, dressed appropriately.  HEENT: Pupils equal, extraocular movements intact  Respiratory: Patient's speak in full sentences and does not appear short of breath  Cardiovascular: No lower extremity edema, non tender, no erythema  Skin: Warm dry intact with no signs of infection or rash on extremities or on axial skeleton.  Abdomen: Soft nontender  Neuro: Cranial nerves II through XII are intact, neurovascularly intact in all extremities with 2+ DTRs and 2+ pulses.  Lymph: No lymphadenopathy of posterior or anterior cervical chain or axillae bilaterally.  Gait normal with good balance and  coordination.  MSK:  Non tender with full range of motion and good stability and symmetric strength and tone of  elbows, wrist, hip, knee and ankles bilaterally.  Shoulder: left Inspection reveals no abnormalities, atrophy or asymmetry. Palpation is normal with no tenderness over AC joint or bicipital groove. ROM is full in all planes passively. Rotator cuff strength normal throughout. signs of impingement with positive Neer and Hawkin's tests, but negative empty can sign. Speeds and Yergason's tests normal. No labral pathology noted with negative Obrien's, negative clunk and good stability. Normal scapular function observed. No painful arc and no drop arm sign. No apprehension sign No significant change  Neck: Inspection unremarkable. No palpable stepoffs. Positive Spurling's maneuver C4 on left. Full neck range of motion Grip strength and sensation normal in bilateral hands Strength good C4 to T1 distribution No sensory change to C4 to T1 Negative Hoffman sign bilaterally Reflexes normal     Impression and Recommendations:     This case required medical decision making of moderate complexity.

## 2014-11-15 ENCOUNTER — Encounter: Payer: Self-pay | Admitting: Family Medicine

## 2014-11-15 ENCOUNTER — Ambulatory Visit (INDEPENDENT_AMBULATORY_CARE_PROVIDER_SITE_OTHER): Payer: 59 | Admitting: Family Medicine

## 2014-11-15 VITALS — BP 130/80 | HR 82 | Temp 98.4°F | Ht 69.0 in | Wt 205.0 lb

## 2014-11-15 DIAGNOSIS — Z23 Encounter for immunization: Secondary | ICD-10-CM

## 2014-11-15 DIAGNOSIS — Z8249 Family history of ischemic heart disease and other diseases of the circulatory system: Secondary | ICD-10-CM

## 2014-11-15 DIAGNOSIS — Z0000 Encounter for general adult medical examination without abnormal findings: Secondary | ICD-10-CM

## 2014-11-15 NOTE — Progress Notes (Signed)
Subjective:    Patient ID: Jesse Pacheco, male    DOB: Mar 13, 1961, 54 y.o.   MRN: [id number]  HPI Patient seen for complete physical. His chronic problems include history of elevated PSA which been followed by urology and is coming back down. He has history of hypertension which has been controlled with medication. No flu vaccine yet. Tetanus up-to-date. Colonoscopy up-to-date.  Patient is nonsmoker. No recent chest pains. Father had coronary disease age 69 and he is concerned about his risk. He has not been exercising much recently. He has metabolic syndrome based on recent labs with positive criteria including prediabetes, hypertension, hypertriglyceridemia, and low HDL  He continues to battle some left shoulder difficulties and is followed by sports medicine.  Past Medical History  Diagnosis Date  . HYPERTENSION 01/09/2009  . VIRAL URI 03/22/2010  . 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 Nocturia 01/09/2009  . Depression    Past Surgical History  Procedure Laterality Date  . Appendectomy    . Spine surgery  2006, 2009    laminectomy    reports that he has never smoked. He does not have any smokeless tobacco history on file. He reports that he drinks alcohol. He reports that he does not use illicit drugs. family history includes Heart disease (age of onset: 52) in his father; Hypertension in his mother; Stroke in his mother. No Known Allergies    Review of Systems  Constitutional: Negative for fever, activity change, appetite change and fatigue.  HENT: Negative for congestion, ear pain and trouble swallowing.   Eyes: Negative for pain and visual disturbance.  Respiratory: Negative for cough, shortness of breath and wheezing.   Cardiovascular: Negative for chest pain and palpitations.  Gastrointestinal: Negative for nausea, vomiting, abdominal pain, diarrhea, constipation, blood in stool, abdominal distention and rectal pain.  Genitourinary: Negative for dysuria, hematuria  and testicular pain.  Musculoskeletal: Negative for joint swelling and arthralgias.  Skin: Negative for rash.  Neurological: Negative for dizziness, syncope and headaches.  Hematological: Negative for adenopathy.  Psychiatric/Behavioral: Negative for confusion and dysphoric mood.       Objective:   Physical Exam  Constitutional: He is oriented to person, place, and time. He appears well-developed and well-nourished. No distress.  HENT:  Head: Normocephalic and atraumatic.  Right Ear: External ear normal.  Left Ear: External ear normal.  Mouth/Throat: Oropharynx is clear and moist.  Eyes: Conjunctivae and EOM are normal. Pupils are equal, round, and reactive to light.  Neck: Normal range of motion. Neck supple. No thyromegaly present.  Cardiovascular: Normal rate, regular rhythm and normal heart sounds.   No murmur heard. Pulmonary/Chest: No respiratory distress. He has no wheezes. He has no rales.  Abdominal: Soft. Bowel sounds are normal. He exhibits no distension and no mass. There is no tenderness. There is no rebound and no guarding.  Musculoskeletal: He exhibits no edema.  Lymphadenopathy:    He has no cervical adenopathy.  Neurological: He is alert and oriented to person, place, and time. He displays normal reflexes. No cranial nerve deficit.  Skin: No rash noted.  Psychiatric: He has a normal mood and affect.          Assessment & Plan:  Complete physical. Labs reviewed. Patient has dyslipidemia and prediabetes-and meets criteria for metabolic syndrome. He specifically requests further cardiac testing. He's not had any symptoms whatsoever and we discussed pros and cons of coronary calcium scoring which he wishes to pursue. Flu vaccine given. Handout given on hypertriglyceridemia and  hypercholesterolemia.

## 2014-11-15 NOTE — Progress Notes (Signed)
Pre visit review using our clinic review tool, if applicable. No additional management support is needed unless otherwise documented below in the visit note. 

## 2014-11-15 NOTE — Patient Instructions (Signed)
Fat and Cholesterol Control Diet  Fat and cholesterol levels in your blood and organs are influenced by your diet. High levels of fat and cholesterol may lead to diseases of the heart, small and large blood vessels, gallbladder, liver, and pancreas.  CONTROLLING FAT AND CHOLESTEROL WITH DIET  Although exercise and lifestyle factors are important, your diet is key. That is because certain foods are known to raise cholesterol and others to lower it. The goal is to balance foods for their effect on cholesterol and more importantly, to replace saturated and trans fat with other types of fat, such as monounsaturated fat, polyunsaturated fat, and omega-3 fatty acids.  On average, a person should consume no more than 15 to 17 g of saturated fat daily. Saturated and trans fats are considered "bad" fats, and they will raise LDL cholesterol. Saturated fats are primarily found in animal products such as meats, butter, and cream. However, that does not mean you need to give up all your favorite foods. Today, there are good tasting, low-fat, low-cholesterol substitutes for most of the things you like to eat. Choose low-fat or nonfat alternatives. Choose round or loin cuts of red meat. These types of cuts are lowest in fat and cholesterol. Chicken (without the skin), fish, veal, and ground turkey breast are great choices. Eliminate fatty meats, such as hot dogs and salami. Even shellfish have little or no saturated fat. Have a 3 oz (85 g) portion when you eat lean meat, poultry, or fish.  Trans fats are also called "partially hydrogenated oils." They are oils that have been scientifically manipulated so that they are solid at room temperature resulting in a longer shelf life and improved taste and texture of foods in which they are added. Trans fats are found in stick margarine, some tub margarines, cookies, crackers, and baked goods.   When baking and cooking, oils are a great substitute for butter. The monounsaturated oils are  especially beneficial since it is believed they lower LDL and raise HDL. The oils you should avoid entirely are saturated tropical oils, such as coconut and palm.   Remember to eat a lot from food groups that are naturally free of saturated and trans fat, including fish, fruit, vegetables, beans, grains (barley, rice, couscous, bulgur wheat), and pasta (without cream sauces).   IDENTIFYING FOODS THAT LOWER FAT AND CHOLESTEROL   Soluble fiber may lower your cholesterol. This type of fiber is found in fruits such as apples, vegetables such as broccoli, potatoes, and carrots, legumes such as beans, peas, and lentils, and grains such as barley. Foods fortified with plant sterols (phytosterol) may also lower cholesterol. You should eat at least 2 g per day of these foods for a cholesterol lowering effect.   Read package labels to identify low-saturated fats, trans fat free, and low-fat foods at the supermarket. Select cheeses that have only 2 to 3 g saturated fat per ounce. Use a heart-healthy tub margarine that is free of trans fats or partially hydrogenated oil. When buying baked goods (cookies, crackers), avoid partially hydrogenated oils. Breads and muffins should be made from whole grains (whole-wheat or whole oat flour, instead of "flour" or "enriched flour"). Buy non-creamy canned soups with reduced salt and no added fats.   FOOD PREPARATION TECHNIQUES   Never deep-fry. If you must fry, either stir-fry, which uses very little fat, or use non-stick cooking sprays. When possible, broil, bake, or roast meats, and steam vegetables. Instead of putting butter or margarine on vegetables, use lemon   and herbs, applesauce, and cinnamon (for squash and sweet potatoes). Use nonfat yogurt, salsa, and low-fat dressings for salads.   LOW-SATURATED FAT / LOW-FAT FOOD SUBSTITUTES  Meats / Saturated Fat (g)  · Avoid: Steak, marbled (3 oz/85 g) / 11 g  · Choose: Steak, lean (3 oz/85 g) / 4 g  · Avoid: Hamburger (3 oz/85 g) / 7  g  · Choose: Hamburger, lean (3 oz/85 g) / 5 g  · Avoid: Ham (3 oz/85 g) / 6 g  · Choose: Ham, lean cut (3 oz/85 g) / 2.4 g  · Avoid: Chicken, with skin, dark meat (3 oz/85 g) / 4 g  · Choose: Chicken, skin removed, dark meat (3 oz/85 g) / 2 g  · Avoid: Chicken, with skin, light meat (3 oz/85 g) / 2.5 g  · Choose: Chicken, skin removed, light meat (3 oz/85 g) / 1 g  Dairy / Saturated Fat (g)  · Avoid: Whole milk (1 cup) / 5 g  · Choose: Low-fat milk, 2% (1 cup) / 3 g  · Choose: Low-fat milk, 1% (1 cup) / 1.5 g  · Choose: Skim milk (1 cup) / 0.3 g  · Avoid: Hard cheese (1 oz/28 g) / 6 g  · Choose: Skim milk cheese (1 oz/28 g) / 2 to 3 g  · Avoid: Cottage cheese, 4% fat (1 cup) / 6.5 g  · Choose: Low-fat cottage cheese, 1% fat (1 cup) / 1.5 g  · Avoid: Ice cream (1 cup) / 9 g  · Choose: Sherbet (1 cup) / 2.5 g  · Choose: Nonfat frozen yogurt (1 cup) / 0.3 g  · Choose: Frozen fruit bar / trace  · Avoid: Whipped cream (1 tbs) / 3.5 g  · Choose: Nondairy whipped topping (1 tbs) / 1 g  Condiments / Saturated Fat (g)  · Avoid: Mayonnaise (1 tbs) / 2 g  · Choose: Low-fat mayonnaise (1 tbs) / 1 g  · Avoid: Butter (1 tbs) / 7 g  · Choose: Extra light margarine (1 tbs) / 1 g  · Avoid: Coconut oil (1 tbs) / 11.8 g  · Choose: Olive oil (1 tbs) / 1.8 g  · Choose: Corn oil (1 tbs) / 1.7 g  · Choose: Safflower oil (1 tbs) / 1.2 g  · Choose: Sunflower oil (1 tbs) / 1.4 g  · Choose: Soybean oil (1 tbs) / 2.4 g  · Choose: Canola oil (1 tbs) / 1 g  Document Released: 08/19/2005 Document Revised: 12/14/2012 Document Reviewed: 11/17/2013  ExitCare® Patient Information ©2015 [employer]. This information is not intended to replace advice given to you by your health care provider. Make sure you discuss any questions you have with your health care provider.  Food Choices to Lower Your Triglycerides   Triglycerides are a type of fat in your blood. High levels of triglycerides can increase the risk of heart disease and stroke. If your  triglyceride levels are high, the foods you eat and your eating habits are very important. Choosing the right foods can help lower your triglycerides.   WHAT GENERAL GUIDELINES DO I NEED TO FOLLOW?  · Lose weight if you are overweight.    · Limit or avoid alcohol.    · Fill one half of your plate with vegetables and green salads.    · Limit fruit to two servings a day. Choose fruit instead of juice.    · Make one fourth of your plate whole grains. Look for the word "  whole" as the first word in the ingredient list.  · Fill one fourth of your plate with lean protein foods.  · Enjoy fatty fish (such as salmon, mackerel, sardines, and tuna) three times a week.    · Choose healthy fats.    · Limit foods high in starch and sugar.  · Eat more home-cooked food and less restaurant, buffet, and fast food.  · Limit fried foods.  · Cook foods using methods other than frying.  · Limit saturated fats.  · Check ingredient lists to avoid foods with partially hydrogenated oils (trans fats) in them.  WHAT FOODS CAN I EAT?   Grains  Whole grains, such as whole wheat or whole grain breads, crackers, cereals, and pasta. Unsweetened oatmeal, bulgur, barley, quinoa, or brown rice. Corn or whole wheat flour tortillas.   Vegetables  Fresh or frozen vegetables (raw, steamed, roasted, or grilled). Green salads.  Fruits  All fresh, canned (in natural juice), or frozen fruits.  Meat and Other Protein Products  Ground beef (85% or leaner), grass-fed beef, or beef trimmed of fat. Skinless chicken or turkey. Ground chicken or turkey. Pork trimmed of fat. All fish and seafood. Eggs. Dried beans, peas, or lentils. Unsalted nuts or seeds. Unsalted canned or dry beans.  Dairy  Low-fat dairy products, such as skim or 1% milk, 2% or reduced-fat cheeses, low-fat ricotta or cottage cheese, or plain low-fat yogurt.  Fats and Oils  Tub margarines without trans fats. Light or reduced-fat mayonnaise and salad dressings. Avocado. Safflower, olive, or canola  oils. Natural peanut or almond butter.  The items listed above may not be a complete list of recommended foods or beverages. Contact your dietitian for more options.  WHAT FOODS ARE NOT RECOMMENDED?   Grains  White bread. White pasta. White rice. Cornbread. Bagels, pastries, and croissants. Crackers that contain trans fat.  Vegetables  White potatoes. Corn. Creamed or fried vegetables. Vegetables in a cheese sauce.  Fruits  Dried fruits. Canned fruit in light or heavy syrup. Fruit juice.  Meat and Other Protein Products  Fatty cuts of meat. Ribs, chicken wings, bacon, sausage, bologna, salami, chitterlings, fatback, hot dogs, bratwurst, and packaged luncheon meats.  Dairy  Whole or 2% milk, cream, half-and-half, and cream cheese. Whole-fat or sweetened yogurt. Full-fat cheeses. Nondairy creamers and whipped toppings. Processed cheese, cheese spreads, or cheese curds.  Sweets and Desserts  Corn syrup, sugars, honey, and molasses. Candy. Jam and jelly. Syrup. Sweetened cereals. Cookies, pies, cakes, donuts, muffins, and ice cream.  Fats and Oils  Butter, stick margarine, lard, shortening, ghee, or bacon fat. Coconut, palm kernel, or palm oils.  Beverages  Alcohol. Sweetened drinks (such as sodas, lemonade, and fruit drinks or punches).  The items listed above may not be a complete list of foods and beverages to avoid. Contact your dietitian for more information.  Document Released: 06/06/2004 Document Revised: 08/24/2013 Document Reviewed: 06/23/2013  ExitCare® Patient Information ©2015 [employer]. This information is not intended to replace advice given to you by your health care provider. Make sure you discuss any questions you have with your health care provider.

## 2014-11-23 ENCOUNTER — Inpatient Hospital Stay: Admission: RE | Admit: 2014-11-23 | Payer: 59 | Source: Ambulatory Visit

## 2014-11-27 ENCOUNTER — Inpatient Hospital Stay (HOSPITAL_COMMUNITY)
Admission: EM | Admit: 2014-11-27 | Discharge: 2014-11-29 | DRG: 065 | Disposition: A | Discharge status: Home / Self Care | Payer: 59 | Attending: Internal Medicine | Admitting: Internal Medicine

## 2014-11-27 ENCOUNTER — Observation Stay (HOSPITAL_COMMUNITY): Payer: 59

## 2014-11-27 ENCOUNTER — Encounter (HOSPITAL_COMMUNITY): Payer: Self-pay

## 2014-11-27 ENCOUNTER — Emergency Department (HOSPITAL_COMMUNITY): Payer: 59

## 2014-11-27 DIAGNOSIS — Z823 Family history of stroke: Secondary | ICD-10-CM | POA: Diagnosis not present

## 2014-11-27 DIAGNOSIS — R4182 Altered mental status, unspecified: Secondary | ICD-10-CM | POA: Diagnosis not present

## 2014-11-27 DIAGNOSIS — R55 Syncope and collapse: Secondary | ICD-10-CM

## 2014-11-27 DIAGNOSIS — I63432 Cerebral infarction due to embolism of left posterior cerebral artery: Secondary | ICD-10-CM | POA: Diagnosis present

## 2014-11-27 DIAGNOSIS — E785 Hyperlipidemia, unspecified: Secondary | ICD-10-CM | POA: Diagnosis present

## 2014-11-27 DIAGNOSIS — G459 Transient cerebral ischemic attack, unspecified: Secondary | ICD-10-CM

## 2014-11-27 DIAGNOSIS — Z8249 Family history of ischemic heart disease and other diseases of the circulatory system: Secondary | ICD-10-CM | POA: Diagnosis not present

## 2014-11-27 DIAGNOSIS — I639 Cerebral infarction, unspecified: Secondary | ICD-10-CM | POA: Diagnosis not present

## 2014-11-27 DIAGNOSIS — Q211 Atrial septal defect: Secondary | ICD-10-CM | POA: Diagnosis not present

## 2014-11-27 DIAGNOSIS — Z7982 Long term (current) use of aspirin: Secondary | ICD-10-CM

## 2014-11-27 DIAGNOSIS — I63422 Cerebral infarction due to embolism of left anterior cerebral artery: Secondary | ICD-10-CM | POA: Diagnosis present

## 2014-11-27 DIAGNOSIS — E781 Pure hyperglyceridemia: Secondary | ICD-10-CM | POA: Diagnosis present

## 2014-11-27 DIAGNOSIS — Z791 Long term (current) use of non-steroidal anti-inflammatories (NSAID): Secondary | ICD-10-CM

## 2014-11-27 DIAGNOSIS — Z79899 Other long term (current) drug therapy: Secondary | ICD-10-CM | POA: Diagnosis not present

## 2014-11-27 DIAGNOSIS — I6350 Cerebral infarction due to unspecified occlusion or stenosis of unspecified cerebral artery: Secondary | ICD-10-CM | POA: Diagnosis not present

## 2014-11-27 DIAGNOSIS — I63412 Cerebral infarction due to embolism of left middle cerebral artery: Secondary | ICD-10-CM | POA: Diagnosis present

## 2014-11-27 DIAGNOSIS — R410 Disorientation, unspecified: Secondary | ICD-10-CM | POA: Diagnosis not present

## 2014-11-27 DIAGNOSIS — R079 Chest pain, unspecified: Secondary | ICD-10-CM

## 2014-11-27 DIAGNOSIS — I10 Essential (primary) hypertension: Secondary | ICD-10-CM | POA: Diagnosis present

## 2014-11-27 DIAGNOSIS — I6789 Other cerebrovascular disease: Secondary | ICD-10-CM | POA: Diagnosis not present

## 2014-11-27 LAB — ETHANOL

## 2014-11-27 LAB — CBC
HCT: 42.2 % (ref 39.0–52.0)
Hemoglobin: 15 g/dL (ref 13.0–17.0)
MCH: 32.5 pg (ref 26.0–34.0)
MCHC: 35.5 g/dL (ref 30.0–36.0)
MCV: 91.5 fL (ref 78.0–100.0)
Platelets: 180 10*3/uL (ref 150–400)
RBC: 4.61 MIL/uL (ref 4.22–5.81)
RDW: 13 % (ref 11.5–15.5)
WBC: 5.1 10*3/uL (ref 4.0–10.5)

## 2014-11-27 LAB — URINALYSIS, ROUTINE W REFLEX MICROSCOPIC
Bilirubin Urine: NEGATIVE
Glucose, UA: NEGATIVE mg/dL
Hgb urine dipstick: NEGATIVE
Ketones, ur: NEGATIVE mg/dL
Leukocytes, UA: NEGATIVE
NITRITE: NEGATIVE
PROTEIN: NEGATIVE mg/dL
SPECIFIC GRAVITY, URINE: 1.019 (ref 1.005–1.030)
Urobilinogen, UA: 0.2 mg/dL (ref 0.0–1.0)
pH: 7 (ref 5.0–8.0)

## 2014-11-27 LAB — TROPONIN I

## 2014-11-27 LAB — BASIC METABOLIC PANEL
ANION GAP: 9 (ref 5–15)
BUN: 18 mg/dL (ref 6–23)
CALCIUM: 9.5 mg/dL (ref 8.4–10.5)
CO2: 30 mmol/L (ref 19–32)
Chloride: 101 mmol/L (ref 96–112)
Creatinine, Ser: 0.88 mg/dL (ref 0.50–1.35)
GFR calc Af Amer: >90 mL/min (ref >90)
GFR calc non Af Amer: >90 mL/min (ref >90)
Glucose, Bld: 110 mg/dL — ABNORMAL HIGH (ref 70–99)
Potassium: 4.2 mmol/L (ref 3.5–5.1)
SODIUM: 140 mmol/L (ref 135–145)

## 2014-11-27 LAB — I-STAT TROPONIN, ED: Troponin i, poc: 0 ng/mL (ref 0.00–0.08)

## 2014-11-27 MED ORDER — IBUPROFEN 200 MG PO TABS: 200.0000 mg | ORAL_TABLET | Freq: Four times a day (QID) | ORAL | Status: DC | PRN

## 2014-11-27 MED ORDER — ATORVASTATIN CALCIUM 80 MG PO TABS
80.0000 mg | ORAL_TABLET | Freq: Every day | ORAL | Status: DC
  Administered 2014-11-27 – 2014-11-28 (×2): 80 mg via ORAL
  Filled 2014-11-27 (×3): qty 1

## 2014-11-27 MED ORDER — SENNOSIDES-DOCUSATE SODIUM 8.6-50 MG PO TABS
1.0000 | ORAL_TABLET | Freq: Every evening | ORAL | Status: DC | PRN
  Filled 2014-11-27: qty 1

## 2014-11-27 MED ORDER — LORAZEPAM 2 MG/ML IJ SOLN: 1.0000 mg | Freq: Once | INTRAMUSCULAR | Status: AC | PRN

## 2014-11-27 MED ORDER — STROKE: EARLY STAGES OF RECOVERY BOOK
Freq: Once | Status: AC
  Administered 2014-11-28: 10:00:00
  Filled 2014-11-27 (×2): qty 1

## 2014-11-27 MED ORDER — ASPIRIN EC 325 MG PO TBEC
325.0000 mg | DELAYED_RELEASE_TABLET | Freq: Every day | ORAL | Status: DC
  Administered 2014-11-28 – 2014-11-29 (×2): 325 mg via ORAL
  Filled 2014-11-27 (×2): qty 1

## 2014-11-27 MED ORDER — ENOXAPARIN SODIUM 40 MG/0.4ML ~~LOC~~ SOLN
40.0000 mg | SUBCUTANEOUS | Status: DC
  Administered 2014-11-27 – 2014-11-29 (×3): 40 mg via SUBCUTANEOUS
  Filled 2014-11-27 (×3): qty 0.4

## 2014-11-27 MED ORDER — HYDRALAZINE HCL 20 MG/ML IJ SOLN
10.0000 mg | Freq: Four times a day (QID) | INTRAMUSCULAR | Status: DC | PRN
Start: 2014-11-27 — End: 2014-11-29

## 2014-11-27 MED ORDER — GABAPENTIN 100 MG PO CAPS
200.0000 mg | ORAL_CAPSULE | Freq: Two times a day (BID) | ORAL | Status: DC | PRN
  Filled 2014-11-27: qty 2

## 2014-11-27 MED ORDER — SODIUM CHLORIDE 0.9 % IV SOLN: INTRAVENOUS | Status: DC

## 2014-11-27 MED ORDER — ASPIRIN EC 81 MG PO TBEC
81.0000 mg | DELAYED_RELEASE_TABLET | Freq: Every day | ORAL | Status: DC
  Administered 2014-11-27: 81 mg via ORAL
  Filled 2014-11-27: qty 1

## 2014-11-27 MED ORDER — ATORVASTATIN CALCIUM 20 MG PO TABS
20.0000 mg | ORAL_TABLET | Freq: Every day | ORAL | Status: DC
  Filled 2014-11-27: qty 1

## 2014-11-27 NOTE — ED Notes (Signed)
Pt given urinal and made aware a urine specimen was needed.

## 2014-11-27 NOTE — ED Notes (Signed)
Pt here for fall and right side weakness, was very slow to respond and altered at home, also reports chest pain given ntg and asa and now pain free. On arrival here patient alert and oriented.

## 2014-11-27 NOTE — ED Notes (Signed)
Dr. Stewart at the bedside. 

## 2014-11-27 NOTE — Progress Notes (Signed)
UR completed 

## 2014-11-27 NOTE — Consult Note (Addendum)
Admission H&P    Chief Complaint: Altered mental status with right-sided weakness.  HPI: Jesse Pacheco is an 54 y.o. male history of hypertension and depression, rubs to the emergency room following an episode of altered consciousness with right-sided weakness. Patient fell and was noted to be staring with dentist responsiveness but not unconscious he had difficulty moving his right extremities but no problems moving left extremities. No facial asymmetry was noted. He was last known well at 6: 85 AM today. Patient has no memory for this event, doesn't remember anything until he was in the ambulance on his way to the hospital. There's no previous history of stroke nor TIA. CT scan of his head showed no acute intracranial abnormality. Weakness of right extremities resolved. NIH stroke score was 0 at the time of this evaluation.  LSN: 6:45 AM on 11/27/2014 tPA Given: No: Deficits resolved MRankin: 0  Past Medical History  Diagnosis Date  . HYPERTENSION 01/09/2009  . VIRAL URI 03/22/2010  . 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 Nocturia 01/09/2009  . Depression     Past Surgical History  Procedure Laterality Date  . Appendectomy    . Spine surgery  2006, 2009    laminectomy    Family History  Problem Relation Age of Onset  . Hypertension Mother   . Stroke Mother   . Heart disease Father 16    CABG   Social History:  reports that he has never smoked. He does not have any smokeless tobacco history on file. He reports that he drinks alcohol. He reports that he does not use illicit drugs.  Allergies: No Known Allergies  Medications: Patient's preadmission medications were reviewed by me.  ROS: History obtained from the patient and his significant other  General ROS: negative for - chills, fatigue, fever, night sweats, weight gain or weight loss Psychological ROS: negative for - behavioral disorder, hallucinations, memory difficulties, mood swings or suicidal ideation Ophthalmic  ROS: negative for - blurry vision, double vision, eye pain or loss of vision ENT ROS: negative for - epistaxis, nasal discharge, oral lesions, sore throat, tinnitus or vertigo Allergy and Immunology ROS: negative for - hives or itchy/watery eyes Hematological and Lymphatic ROS: negative for - bleeding problems, bruising or swollen lymph nodes Endocrine ROS: negative for - galactorrhea, hair pattern changes, polydipsia/polyuria or temperature intolerance Respiratory ROS: negative for - cough, hemoptysis, shortness of breath or wheezing Cardiovascular ROS: negative for - chest pain, dyspnea on exertion, edema or irregular heartbeat Gastrointestinal ROS: negative for - abdominal pain, diarrhea, hematemesis, nausea/vomiting or stool incontinence Genito-Urinary ROS: negative for - dysuria, hematuria, incontinence or urinary frequency/urgency Musculoskeletal ROS: negative for - joint swelling or muscular weakness Neurological ROS: as noted in HPI Dermatological ROS: negative for rash and skin lesion changes  Physical Examination: Blood pressure 119/76, pulse 65, temperature 98.1 F (36.7 C), temperature source Oral, resp. rate 13, height $RemoveBe'5\' 10"'TMBUKcBzW$  (1.778 m), weight 90.719 kg (200 lb), SpO2 98 %.  HEENT-  Normocephalic, no lesions, without obvious abnormality.  Normal external eye and conjunctiva.  Normal TM's bilaterally.  Normal auditory canals and external ears. Normal external nose, mucus membranes and septum.  Normal pharynx. Neck supple with no masses, nodes, nodules or enlargement. Cardiovascular - regular rate and rhythm, S1, S2 normal, no murmur, click, rub or gallop Lungs - chest clear, no wheezing, rales, normal symmetric air entry Abdomen - soft, non-tender; bowel sounds normal; no masses,  no organomegaly Extremities - no joint deformities, effusion, or inflammation, no  edema and no skin discoloration Mental Status:  Neurologic Examination: Alert, oriented, thought content appropriate.   Speech fluent without evidence of aphasia. Able to follow commands without difficulty. Cranial Nerves: II-Visual fields were normal. III/IV/VI-Pupils were equal and reacted normally to light. Extraocular movements were full and conjugate.    V/VII-no facial numbness and no facial weakness. VIII-normal. X-normal speech and symmetrical palatal movement. XI: trapezius strength/neck flexion strength normal bilaterally XII-midline tongue extension with normal strength. Motor: 5/5 bilaterally with normal tone and bulk Sensory: Normal throughout. Deep Tendon Reflexes: 1+ and symmetric and absent in the lower extremities. Plantars: Flexor bilaterally Cerebellar: Normal finger-to-nose testing. Carotid auscultation: Normal   Results for orders placed or performed during the hospital encounter of 11/27/14 (from the past 48 hour(s))  CBC     Status: None   Collection Time: 11/27/14  8:45 AM  Result Value Ref Range   WBC 5.1 4.0 - 10.5 K/uL   RBC 4.61 4.22 - 5.81 MIL/uL   Hemoglobin 15.0 13.0 - 17.0 g/dL   HCT 42.2 39.0 - 52.0 %   MCV 91.5 78.0 - 100.0 fL   MCH 32.5 26.0 - 34.0 pg   MCHC 35.5 30.0 - 36.0 g/dL   RDW 13.0 11.5 - 15.5 %   Platelets 180 150 - 400 K/uL  Basic metabolic panel     Status: Abnormal   Collection Time: 11/27/14  8:45 AM  Result Value Ref Range   Sodium 140 135 - 145 mmol/L   Potassium 4.2 3.5 - 5.1 mmol/L   Chloride 101 96 - 112 mmol/L   CO2 30 19 - 32 mmol/L   Glucose, Bld 110 (H) 70 - 99 mg/dL   BUN 18 6 - 23 mg/dL   Creatinine, Ser 0.88 0.50 - 1.35 mg/dL   Calcium 9.5 8.4 - 10.5 mg/dL   GFR calc non Af Amer >90 >90 mL/min   GFR calc Af Amer >90 >90 mL/min    Comment: (NOTE) The eGFR has been calculated using the CKD EPI equation. This calculation has not been validated in all clinical situations. eGFR's persistently <90 mL/min signify possible Chronic Kidney Disease.    Anion gap 9 5 - 15  I-stat troponin, ED (not at [facility])     Status: None   Collection  Time: 11/27/14  8:50 AM  Result Value Ref Range   Troponin i, poc 0.00 0.00 - 0.08 ng/mL   Comment 3            Comment: Due to the release kinetics of cTnI, a negative result within the first hours of the onset of symptoms does not rule out myocardial infarction with certainty. If myocardial infarction is still suspected, repeat the test at appropriate intervals.    Ct Head Wo Contrast  11/27/2014   CLINICAL DATA:  Episode of confusion earlier today  EXAM: CT HEAD WITHOUT CONTRAST  TECHNIQUE: Contiguous axial images were obtained from the base of the skull through the vertex without intravenous contrast.  COMPARISON:  July 19, 2008  FINDINGS: The ventricles are normal in size and configuration. There is no mass, hemorrhage, extra-axial fluid collection, or midline shift. Gray-white compartments are normal. No acute infarct apparent. Bony calvarium appears intact. The mastoid air cells are clear.  IMPRESSION: Study within normal limits.   Electronically Signed   By: Lowella Grip III M.D.   On: 11/27/2014 09:57    Assessment: 54 y.o. male with multiple risk factors for stroke presenting with altered mental status of unclear  etiology, as well as probable left MCA territory transient ischemic attack. However, acute left cerebral infarction cannot be ruled out at this point. Mental status has returned to normal at this point. Partial seizure is unlikely but cannot be completely ruled out.  Stroke Risk Factors - family history and hypertension  Plan: 1. HgbA1c, fasting lipid panel 2. MRI, MRA  of the brain without contrast 3. PT consult, OT consult, Speech consult 4. Echocardiogram 5. Carotid dopplers 6. Prophylactic therapy-Antiplatelet med: Aspirin  7. EEG, routine adult study 8. Telemetry monitoring  C.R. Nicole Kindred, MD [facility] [phone]  11/27/2014, 10:17 AM

## 2014-11-27 NOTE — H&P (Signed)
[facility] History and Physical                                                                                    Jesse Pacheco, is a 54 y.o. male  MRN: [id number]   DOB - 27-Oct-1960  Admit Date - 11/27/2014  Outpatient Primary MD for the patient is Kristian CoveyBURCHETTE,BRUCE W, MD  With History of -  Past Medical History  Diagnosis Date  . HYPERTENSION 01/09/2009  . VIRAL URI 03/22/2010  . 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 Nocturia 01/09/2009  . Depression       Past Surgical History  Procedure Laterality Date  . Appendectomy    . Spine surgery  2006, 2009    laminectomy    in for   Chief Complaint  Patient presents with  . Chest Pain  . Altered Mental Status     HPI  Jesse Opitzhomas Brecht  is a 54 y.o. male, with a history of HTN, HLD and elevated PSA who presented to the ER today from home with stroke like symptoms.  Mr. Georgiann MccoyCiamillo reports that he has been feeling well.  He got up this morning at approximately 6:30 am and went to the kitchen to get a glass of apple juice.  He was walking back to the bedroom and notices that his arms and legs felt shaky.  He sat down hard on the floor.  His significant other heard a thud and came to check on him.  He was unable to move his right leg or arm to stand.  He had no bowel or urinary incontinence.  She states that he was coherent but slow to respond to her questions, so she called 911.  Afterward Mr. Georgiann MccoyCiamillo states he doesn't remember much.  EMS reports he was unable to move his right leg as well.  In the ER his CT head is negative and labs look good.  Neurology has seen him and felt he should be admitted for a stroke work up.  He appeared to have a residual right sided visual field defect.  Thrombolytics were considered but decided against as his symptoms were too mild and seem to be resolving.  Review of Systems   In addition to the HPI above,  No Fever-chills, No Headache, No changes with Vision or hearing, No problems swallowing  food or Liquids, No Chest pain, Cough or Shortness of Breath, No Abdominal pain, No Nausea or Vomiting, Bowel movements are regular, No Blood in stool or Urine, No dysuria, No new skin rashes or bruises, No new joints pains-aches,  No new weakness, tingling, numbness in any extremity, No recent weight gain or loss, A full 10 point Review of Systems was done, except as stated above, all other Review of Systems were negative.  Social History History  Substance Use Topics  . Smoking status: Never Smoker   . Smokeless tobacco: Not on file  . Alcohol Use: Yes     Comment: drinks 3-4 times a week    Family History Family History  Problem Relation Age of Onset  . Hypertension Mother   . Stroke Mother   . Heart disease Father [id number]  CABG  Mother died with a stroke in her late 27's.  Prior to Admission medications   Medication Sig Start Date End Date Taking? Authorizing Provider  aspirin 81 MG tablet Take 81 mg by mouth daily.   Yes Historical Provider, MD  chlorthalidone (HYGROTON) 25 MG tablet TAKE 1 TABLET BY MOUTH EVERY DAY *NEEDS OFFICE VISIT *INS. WILL ONLY PAY 30 DAYS 11/14/14  Yes Kristian Covey, MD  gabapentin (NEURONTIN) 100 MG capsule Take 2 capsules (200 mg total) by mouth at bedtime. Patient taking differently: Take 200 mg by mouth 3 times/day as needed-between meals & bedtime (neuropathic pain).  11/14/14  Yes Judi Saa, DO  ibuprofen (ADVIL,MOTRIN) 200 MG tablet Take 200 mg by mouth every 6 (six) hours as needed for mild pain or moderate pain.   Yes Historical Provider, MD  lisinopril (PRINIVIL,ZESTRIL) 40 MG tablet TAKE 1 TABLET BY MOUTH EVERY DAY *NEEDS OFFICE VISIT *INS. WILL ONLY PAY 30 DAYS 02/24/14  Yes Kristian Covey, MD  Multiple Vitamin (MULITIVITAMIN WITH MINERALS) TABS Take 1 tablet by mouth daily.   Yes Historical Provider, MD  predniSONE (DELTASONE) 50 MG tablet Take 1 tablet (50 mg total) by mouth daily. Patient not taking: Reported on 11/27/2014  11/14/14   Judi Saa, DO    No Known Allergies  Physical Exam  Vitals  Blood pressure 121/85, pulse 71, temperature 97.6 F (36.4 C), temperature source Oral, resp. rate 24, height  (1.778 m), weight 90.719 kg (200 lb), SpO2 98 %.   General: well developed overweight, pleasant male, lying in bed in NAD, significant other at bedside.  Psych:  Normal affect and insight, Not Suicidal or Homicidal, Awake Alert, Oriented X 3.  Neuro:   Does appear to have a right sided visual field defect, other wise,  ALL C.Nerves Intact, Strength 5/5 all 4 extremities, Sensation intact all 4 extremities.  ENT:  Ears and Eyes appear Normal, Conjunctivae clear, PER. Moist oral mucosa without erythema or exudates.  Neck:  Supple, No lymphadenopathy appreciated  Respiratory:  Symmetrical chest wall movement, Good air movement bilaterally, CTAB.  Cardiac:  RRR, No Murmurs, no LE edema noted, no JVD.    Abdomen:  Positive bowel sounds, Soft, Non tender, Non distended,  No masses appreciated  Skin:  No Cyanosis, Normal Skin Turgor, No Skin Rash or Bruise.  Extremities:  Able to move all 4. 5/5 strength in each,  no effusions.  Data Review  CBC  Recent Labs Lab 11/27/14 0845  WBC 5.1  HGB 15.0  HCT 42.2  PLT 180  MCV 91.5  MCH 32.5  MCHC 35.5  RDW 13.0    Chemistries   Recent Labs Lab 11/27/14 0845  NA 140  K 4.2  CL 101  CO2 30  GLUCOSE 110*  BUN 18  CREATININE 0.88  CALCIUM 9.5    Imaging results:   Dg Cervical Spine Complete  11/14/2014   CLINICAL DATA:  LEFT shoulder pain for 2 months, prior neck fusion in April 2015  EXAM: CERVICAL SPINE  4+ VIEWS  COMPARISON:  12/15/2013  FINDINGS: Prevertebral soft tissues normal thickness.  Osseous mineralization grossly normal.  Prior anterior fusion of C C3-C6 with intact hardware and stable disc prostheses at each level.  Mild motion artifacts degrade RPO view.  Narrowing of neural foramina on RIGHT at C3-C4, C4-C5 and  C5-C6 as well as LEFT C6-C7 by uncovertebral spurs.  No acute fracture, subluxation or bone destruction.  Lung apices clear.  IMPRESSION:  Prior anterior fusion C3-C6.  Scattered narrowing of neural foramina by uncovertebral spurs.  No acute abnormalities.   Electronically Signed   By: Ulyses Southward M.D.   On: 11/14/2014 09:12   Ct Head Wo Contrast  11/27/2014   CLINICAL DATA:  Episode of confusion earlier today  EXAM: CT HEAD WITHOUT CONTRAST  TECHNIQUE: Contiguous axial images were obtained from the base of the skull through the vertex without intravenous contrast.  COMPARISON:  July 19, 2008  FINDINGS: The ventricles are normal in size and configuration. There is no mass, hemorrhage, extra-axial fluid collection, or midline shift. Gray-white compartments are normal. No acute infarct apparent. Bony calvarium appears intact. The mastoid air cells are clear.  IMPRESSION: Study within normal limits.   Electronically Signed   By: Bretta Bang III M.D.   On: 11/27/2014 09:57   Dg Shoulder Left  11/14/2014   CLINICAL DATA:  54 year old male with left shoulder pain for 2 months. No known injury. Prior cervical spine surgery in 2015. Initial encounter.  EXAM: LEFT SHOULDER - 2+ VIEW  COMPARISON:  None.  FINDINGS: No glenohumeral joint dislocation. Bone mineralization is within normal limits. Proximal left humerus appears intact. Left clavicle and scapula appear intact. Visualized lung parenchyma within normal limits. Evidence of chronic/healed left posterior lateral fifth through seventh rib fractures. Partially visible cervical ACDF hardware.  IMPRESSION: No acute osseous abnormality identified about the left shoulder.   Electronically Signed   By: Odessa Fleming M.D.   On: 11/14/2014 09:30    My personal review of EKG: NSR.  Normal QTc   Assessment & Plan  Probable Stroke Will admit for stroke work up & education.  MRI/MRA, 2D echo, Carotids, lab work. Admit to neuro tele obs.  Anticipate he will be able  to leave 3/28 He already takes an 81 mg aspirin.  Will continue.  Hypertension Appears controlled.  Will hold any anti hypertensive meds unless his BP reaches 180.  Hyperlipidemia Labs from February indicate cholesterol 223, triglycerides 313, hdl 38.7, vldl 62.6 I don't see a cholesterol med on his list.  Will start lipitor.   DVT Prophylaxis: lovenox  AM Labs Ordered, also please review Full Orders  Family Communication:   Girlfriend at bedside.  Code Status:  full  Condition:  Guarded. Time spent in minutes : [address]  PA-C on 11/27/2014 at 11:09 AM  Between 7am to 7pm - Pager - [phone]  After 7pm go to [email or web address] - password [email or web address]  And look for the night coverage person covering me after hours  [facility]

## 2014-11-27 NOTE — ED Provider Notes (Addendum)
CSN: [id number]     Arrival date & time 11/27/14  1191 History   First MD Initiated Contact with Patient 11/27/14 [phone]     Chief Complaint  Patient presents with  . Chest Pain  . Altered Mental Status     (Consider location/radiation/quality/duration/timing/severity/associated sxs/prior Treatment) HPI Comments: 54 year old male with history of high blood pressure presents after episode of possible syncope confusion and right-sided weakness. Patient woke up around 6 this morning to get a glass of juice and said he felt fine, neck pain no disease in the ambulance after he felt weak and per his girlfriend she heard a thump and found him on the floor with mild general confusion and right-sided weakness. No history of similar no history of stroke or heart conditions. Patient currently has no symptoms. Per report he complained briefly of chest pain, patient denies any chest pain event. Patient denies heavy alcohol use occasionally drinks had one drink last night, nonsmoker. No known seizure history. Girlfriend stabbed brief shaking activity however no prolonged seizure activity per her report. Patient was getting aspirin on route  Patient is a 54 y.o. male presenting with chest pain and altered mental status. The history is provided by the patient.  Chest Pain Associated symptoms: altered mental status and weakness   Associated symptoms: no abdominal pain, no back pain, no fever, no headache, no shortness of breath and not vomiting   Altered Mental Status Presenting symptoms: confusion   Associated symptoms: weakness   Associated symptoms: no abdominal pain, no fever, no headaches, no light-headedness, no rash and no vomiting     Past Medical History  Diagnosis Date  . HYPERTENSION 01/09/2009  . VIRAL URI 03/22/2010  . 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 Nocturia 01/09/2009  . Depression    Past Surgical History  Procedure Laterality Date  . Appendectomy    . Spine surgery  2006, 2009     laminectomy   Family History  Problem Relation Age of Onset  . Hypertension Mother   . Stroke Mother   . Heart disease Father 27    CABG   History  Substance Use Topics  . Smoking status: Never Smoker   . Smokeless tobacco: Not on file  . Alcohol Use: Yes     Comment: drinks 3-4 times a week    Review of Systems  Constitutional: Negative for fever and chills.  HENT: Negative for congestion.   Eyes: Negative for visual disturbance.  Respiratory: Negative for shortness of breath.   Cardiovascular: Negative for chest pain.  Gastrointestinal: Negative for vomiting and abdominal pain.  Genitourinary: Negative for dysuria and flank pain.  Musculoskeletal: Negative for back pain, neck pain and neck stiffness.  Skin: Negative for rash.  Neurological: Positive for weakness. Negative for light-headedness and headaches.  Psychiatric/Behavioral: Positive for confusion.      Allergies  Review of patient's allergies indicates no known allergies.  Home Medications   Prior to Admission medications   Medication Sig Start Date End Date Taking? Authorizing Provider  aspirin 81 MG tablet Take 81 mg by mouth daily.   Yes Historical Provider, MD  chlorthalidone (HYGROTON) 25 MG tablet TAKE 1 TABLET BY MOUTH EVERY DAY *NEEDS OFFICE VISIT *INS. WILL ONLY PAY 30 DAYS 11/14/14  Yes Kristian Covey, MD  gabapentin (NEURONTIN) 100 MG capsule Take 2 capsules (200 mg total) by mouth at bedtime. Patient taking differently: Take 200 mg by mouth 3 times/day as needed-between meals & bedtime (neuropathic pain).  11/14/14  Yes Judi Saa, DO  ibuprofen (ADVIL,MOTRIN) 200 MG tablet Take 200 mg by mouth every 6 (six) hours as needed for mild pain or moderate pain.   Yes Historical Provider, MD  lisinopril (PRINIVIL,ZESTRIL) 40 MG tablet TAKE 1 TABLET BY MOUTH EVERY DAY *NEEDS OFFICE VISIT *INS. WILL ONLY PAY 30 DAYS 02/24/14  Yes Kristian Covey, MD  Multiple Vitamin (MULITIVITAMIN WITH MINERALS)  TABS Take 1 tablet by mouth daily.   Yes Historical Provider, MD  predniSONE (DELTASONE) 50 MG tablet Take 1 tablet (50 mg total) by mouth daily. Patient not taking: Reported on 11/27/2014 11/14/14   Judi Saa, DO   BP 121/85 mmHg  Pulse 71  Temp(Src) 97.6 F (36.4 C) (Oral)  Resp 24  Ht  (1.778 m)  Wt 200 lb (90.719 kg)  BMI 28.70 kg/m2  SpO2 98% Physical Exam  Constitutional: He is oriented to person, place, and time. He appears well-developed and well-nourished.  HENT:  Head: Normocephalic and atraumatic.  Eyes: Conjunctivae are normal. Right eye exhibits no discharge. Left eye exhibits no discharge.  Neck: Normal range of motion. Neck supple. No tracheal deviation present.  Cardiovascular: Normal rate and regular rhythm.   Pulmonary/Chest: Effort normal and breath sounds normal.  Abdominal: Soft. He exhibits no distension. There is no tenderness. There is no guarding.  Musculoskeletal: He exhibits no edema.  Neurological: He is alert and oriented to person, place, and time. Coordination normal. GCS eye subscore is 4. GCS verbal subscore is 5. GCS motor subscore is 6.  5+ strength in UE and LE with f/e at major joints. Sensation to palpation intact in UE and LE. CNs 2-12 grossly intact.  EOMFI.  PERRL.   Finger nose and coordination intact bilateral.   Visual fields intact to finger testing.    Skin: Skin is warm. No rash noted.  Psychiatric: He has a normal mood and affect.  Nursing note and vitals reviewed.   ED Course  Procedures (including critical care time) Labs Review Labs Reviewed  BASIC METABOLIC PANEL - Abnormal; Notable for the following:    Glucose, Bld 110 (*)    All other components within normal limits  CBC  ETHANOL  URINALYSIS, ROUTINE W REFLEX MICROSCOPIC  URINE RAPID DRUG SCREEN (HOSP PERFORMED)  TROPONIN I  I-STAT TROPOININ, ED    Imaging Review Ct Head Wo Contrast  11/27/2014   CLINICAL DATA:  Episode of confusion earlier today   EXAM: CT HEAD WITHOUT CONTRAST  TECHNIQUE: Contiguous axial images were obtained from the base of the skull through the vertex without intravenous contrast.  COMPARISON:  July 19, 2008  FINDINGS: The ventricles are normal in size and configuration. There is no mass, hemorrhage, extra-axial fluid collection, or midline shift. Gray-white compartments are normal. No acute infarct apparent. Bony calvarium appears intact. The mastoid air cells are clear.  IMPRESSION: Study within normal limits.   Electronically Signed   By: Bretta Bang III M.D.   On: 11/27/2014 09:57     EKG Interpretation   Date/Time:  Sunday November 27 2014 08:26:14 EDT Ventricular Rate:  71 PR Interval:  192 QRS Duration: 108 QT Interval:  423 QTC Calculation: 460 R Axis:   -48 Text Interpretation:  Sinus rhythm Abnormal R-wave progression, late  transition Inferior infarct, old Baseline wander in lead(s) V2 similar to  previous Confirmed by Joua Bake  MD, Sonia Stickels ([id number]) on 11/27/2014 8:32:08 AM      MDM   Final diagnoses:  Transient confusion  Syncope and collapse  Chest pain, unspecified chest pain type  Stroke   Patient presents after transient altered mental status/syncope event. Actual event was not witnessed however patient did have weakness in his right arm per girlfriend. Currently patient has no symptoms and has a normal exam. Plan for syncope and neurologic workup. CT head pending.  Neurology consult in divided the patient in the ER.  Discussed with tried hospitalist for observation telemetry for syncope/possible neurologic event.  The patients results and plan were reviewed and discussed.   Any x-rays performed were personally reviewed by myself.   Differential diagnosis were considered with the presenting HPI.  Medications  0.9 %  sodium chloride infusion (not administered)    Filed Vitals:   11/27/14 0915 11/27/14 0954 11/27/14 1000 11/27/14 1015  BP: 127/85 119/76 121/80 121/85  Pulse: 75  65 72 71  Temp:   97.6 F (36.4 C)   TempSrc:      Resp: 14  15 24   Height:      Weight:      SpO2: 95% 98% 100% 98%    Final diagnoses:  Transient confusion  Syncope and collapse  Chest pain, unspecified chest pain type  Stroke    Admission/ observation were discussed with the admitting physician, patient and/or family and they are comfortable with the plan.    Blane OharaJoshua Anastacio Bua, MD 11/27/14 1014  Blane OharaJoshua Brayan Votaw, MD 11/27/14 [phone]

## 2014-11-27 NOTE — ED Notes (Signed)
Hospitalist at the bedside 

## 2014-11-28 DIAGNOSIS — I6789 Other cerebrovascular disease: Secondary | ICD-10-CM

## 2014-11-28 DIAGNOSIS — I639 Cerebral infarction, unspecified: Secondary | ICD-10-CM

## 2014-11-28 DIAGNOSIS — R55 Syncope and collapse: Secondary | ICD-10-CM

## 2014-11-28 LAB — LIPID PANEL
CHOL/HDL RATIO: 5.8 ratio
CHOLESTEROL: 214 mg/dL — AB (ref 0–200)
HDL: 37 mg/dL — AB (ref >39)
LDL CALC: 135 mg/dL — AB (ref 0–99)
TRIGLYCERIDES: 211 mg/dL — AB (ref <150)
VLDL: 42 mg/dL — AB (ref 0–40)

## 2014-11-28 LAB — ANTITHROMBIN III: AntiThromb III Func: 114 % (ref 75–120)

## 2014-11-28 MED ORDER — SODIUM CHLORIDE 0.9 % IV SOLN
INTRAVENOUS | Status: DC
  Administered 2014-11-28: 20:00:00 via INTRAVENOUS

## 2014-11-28 NOTE — Progress Notes (Signed)
*  PRELIMINARY RESULTS* Echocardiogram 2D Echocardiogram has been performed.  Jeryl ColumbiaLLIOTT, Printice Hellmer 11/28/2014, 8:55 AM

## 2014-11-28 NOTE — Progress Notes (Signed)
    [facility] has been requested to perform a transesophageal echocardiogram on Jesse Pacheco for stroke work up.  After careful review of history and examination, the risks and benefits of transesophageal echocardiogram have been explained including risks of esophageal damage, perforation (1:10,000 risk), bleeding, pharyngeal hematoma as well as other potential complications associated with conscious sedation including aspiration, arrhythmia, respiratory failure and death. Alternatives to treatment were discussed, questions were answered. Patient is willing to proceed.   Wilburt FinlayHAGER, Sharon Stapel, PA-C 11/28/2014 2:13 PM

## 2014-11-28 NOTE — Evaluation (Addendum)
Physical Therapy Evaluation and Discharge Patient Details Name: Jesse Pacheco MRN: [id number] DOB: 1961-06-28 Today's Date: 11/28/2014   History of Present Illness  54 y.o. male admitted for stroke like symptoms, MRI and MRA of the brain reviewed and showed significant infarct involving both left ACA and MCA territory.  Clinical Impression  Pt admitted with the above complications. LE strength 5/5, equal bilaterally; LUE with notable weakness of elbow extension, shoulder flexion, and grip as compared to RUE. Normal heel to shin and finger to nose tests bilaterally. Demonstrates mild Rt visual neglect and girlfriend reports pt is notably less attentive to questions, demonstrating a flattened affect which is different from his baseline. Functionally, pt ambulates generally well without need for physical assist. Notable mild drift to right and needs intermittent cues for awareness on right side. No further acute PT needs identified. All education has been completed and the patient has no further questions. See below for any follow-up Physial Therapy or equipment needs. PT is signing off. Thank you for this referral.        Follow Up Recommendations Outpatient PT (Outpatient Neuro clinic)    Equipment Recommendations  None recommended by PT    Recommendations for Other Services       Precautions / Restrictions Precautions Precautions: None Restrictions Weight Bearing Restrictions: No      Mobility  Bed Mobility Overal bed mobility: Modified Independent                Transfers Overall transfer level: Needs assistance Equipment used: None Transfers: Sit to/from Stand Sit to Stand: Supervision         General transfer comment: Performed x2 from lowest bed setting. First attempt pt sat back down due to poor momentum. Supervision for safety. No physical assist required.  Ambulation/Gait Ambulation/Gait assistance: Supervision Ambulation Distance (Feet): 475  Feet Assistive device: None Gait Pattern/deviations: Step-through pattern;Drifts right/left   Gait velocity interpretation: at or above normal speed for age/gender General Gait Details: Mild drift towards right noted with supervision for safety, and cues for awareness on Rt due to mild neglect. Did not run into objects. Tolerated dynamic gait tasks without loss of balance including variable speeds, quick turns, backwards stepping, and high marching. Pt needs cues repeated at times for instructions with turns and other tasks. Did not require physical assist at any point but does report feeling off balance.  Stairs            Wheelchair Mobility    Modified Rankin (Stroke Patients Only) Modified Rankin (Stroke Patients Only) Pre-Morbid Rankin Score: No symptoms Modified Rankin: No significant disability     Balance Overall balance assessment: Needs assistance Sitting-balance support: No upper extremity supported;Feet supported Sitting balance-Leahy Scale: Normal     Standing balance support: No upper extremity supported;During functional activity Standing balance-Leahy Scale: Good                               Pertinent Vitals/Pain Pain Assessment: No/denies pain  HR in 90s while ambulating    Home Living Family/patient expects to be discharged to:: Private residence Living Arrangements: Spouse/significant other Available Help at Discharge: Family;Available 24 hours/day Type of Home: House Home Access: Stairs to enter Entrance Stairs-Rails: Doctor, general practiceight;Left Entrance Stairs-Number of Steps: 2 Home Layout: Two level;Able to live on main level with bedroom/bathroom Home Equipment: None      Prior Function Level of Independence: Independent  Comments: Herbalist Dominance   Dominant Hand: Right    Extremity/Trunk Assessment   Upper Extremity Assessment: LUE deficits/detail       LUE Deficits / Details: MMT: Left tricep 3+/4  strength, Grip strength diminished slightly compared to Rt, Left wrist flexors 4+/5 ( Rt 5/5), left shoulder flexion 4/5 reports some pain which is baseline (rt shoulder flexion 5/5) .   Lower Extremity Assessment: Overall WFL for tasks assessed (MMT 5/5 BIL LEs. Normal light touch and proprioception)         Communication   Communication: No difficulties  Cognition Arousal/Alertness: Awake/alert Behavior During Therapy: Flat affect Overall Cognitive Status: Impaired/Different from baseline Area of Impairment: Problem solving;Following commands       Following Commands: Follows multi-step commands with increased time     Problem Solving: Slow processing;Decreased initiation General Comments: Per girlfriend, pt has been slower to respond and at times does not answer questions as he is normally more attentive.    General Comments General comments (skin integrity, edema, etc.): Patient's girlfriend reports some inattentiveness and flat affect which is different from baseline.     Exercises        Assessment/Plan    PT Assessment Patent does not need any further PT services  PT Diagnosis Abnormality of gait   PT Problem List    PT Treatment Interventions     PT Goals (Current goals can be found in the Care Plan section) Acute Rehab PT Goals Patient Stated Goal: Get back to work PT Goal Formulation: All assessment and education complete, DC therapy    Frequency     Barriers to discharge        Co-evaluation               End of Session   Activity Tolerance: Patient tolerated treatment well Patient left: in bed;with call bell/phone within reach Nurse Communication: Mobility status         Time: 1610-9604 PT Time Calculation (min) (ACUTE ONLY): 26 min   Charges:   PT Evaluation $Initial PT Evaluation Tier I: 1 Procedure PT Treatments $Gait Training: 8-22 mins   PT G CodesBerton Mount 11/28/2014, 10:37 AM Charlsie Merles,  PT [phone]

## 2014-11-28 NOTE — Progress Notes (Signed)
VASCULAR LAB PRELIMINARY  PRELIMINARY  PRELIMINARY  PRELIMINARY  Carotid Dopplers completed.    Preliminary report:  1-39% ICA stenosis.  Vertebral artery flow is antegrade.   Ayush Boulet, RVT 11/28/2014, 9:15 AM

## 2014-11-28 NOTE — Progress Notes (Signed)
VASCULAR LAB PRELIMINARY  PRELIMINARY  PRELIMINARY  PRELIMINARY  Bilateral lower extremity venous Dopplers completed.    Preliminary report:  There is no DVT or SVT noted in the bilateral lower extremities.   Nyara Capell, RVT 11/28/2014, 9:14 AM

## 2014-11-28 NOTE — Evaluation (Signed)
Speech Language Pathology Evaluation Patient Details Name: Jesse Pacheco MRN: [id number] DOB: 1961/06/17 Today's Date: 11/28/2014 Time: 7622-6333 SLP Time Calculation (min) (ACUTE ONLY): 19 min  Problem List:  Patient Active Problem List   Diagnosis Date Noted  . Stroke 11/27/2014  . Hyperlipidemia 11/27/2014  . Acute ischemic stroke   . Transient confusion   . 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 Shoulder bursitis 10/24/2014  . Elevated PSA 09/22/2012  . VIRAL URI 03/22/2010  . 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 Essential hypertension 01/09/2009  . NOCTURIA 01/09/2009   Past Medical History:  Past Medical History  Diagnosis Date  . HYPERTENSION 01/09/2009  . VIRAL URI 03/22/2010  . 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 Nocturia 01/09/2009  . Depression    Past Surgical History:  Past Surgical History  Procedure Laterality Date  . Appendectomy    . Spine surgery  2006, 2009    laminectomy   HPI:  Pt is a 54 y.o. male with PMH of HTN, HLD, and elevated PSA- presented to ER 3/27 with stroke-like symptoms. Was unable to move R leg and had some residual R side visual field defect. MRI 3/27 revealed acute infarcts in L ACA/ L posterior MCA/ PCA watershed area. PT and OT have evaluated and recommended O/P therapies. Passed stroke swallow screen. SLP eval ordered as part of stroke workup.   Assessment / Plan / Recommendation Clinical Impression  The pt is currently presenting with mild cognitive deficits characterized by decreased selective/ alternating attention, decreased short term memory/ recall of new information, some slowness/ hesitancies in responses to complex questions. Receptive language appears intact for tasks assessed. Pt seems to be aware of errors and areas of difficulty. 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 Educated  pt on these recommendations- pt in agreement. ST will sign off at this time as needs will be met at next level of care. Please re-consult if needs arise.    SLP Assessment  All further Speech Lanaguage Pathology  needs can be addressed in the next venue of care    Follow Up Recommendations  Outpatient SLP    Frequency and Duration        Pertinent Vitals/Pain Pain Assessment: No/denies pain   SLP Goals     SLP Evaluation Prior Functioning  Cognitive/Linguistic Baseline: Within functional limits Type of Home: House Available Help at Discharge: Family;Available 24 hours/day Vocation: Self employed   Cognition  Overall Cognitive Status: Impaired/Different from baseline Arousal/Alertness: Awake/alert Orientation Level: Oriented X4 Attention: Selective Selective Attention: Impaired Selective Attention Impairment: Verbal complex Memory: Impaired Memory Impairment: Decreased recall of new information;Decreased short term memory Decreased Short Term Memory: Verbal complex;Functional complex Awareness: Appears intact Problem Solving: Appears intact Safety/Judgment: Appears intact    Comprehension  Auditory Comprehension Overall Auditory Comprehension: Appears within functional limits for tasks assessed Yes/No Questions: Within Functional Limits Commands: Within Functional Limits Conversation: Complex EffectiveTechniques: Extra processing time Visual Recognition/Discrimination Discrimination: Within Function Limits Reading Comprehension Reading Status: Within funtional limits    Expression Expression Primary Mode of Expression: Verbal Verbal Expression Overall Verbal Expression: Impaired Initiation: No impairment Level of Generative/Spontaneous Verbalization: Conversation Repetition: No impairment Naming: Impairment Responsive: 76-100% accurate Confrontation: Within functional limits Convergent: 75-100% accurate Divergent: 50-74% accurate Verbal Errors: Aware of  errors;Other (comment) (difficulties with divergent naming task) Pragmatics: No impairment (girlfriend reports flat affect) Interfering Components: Attention Non-Verbal Means of  Communication: Not applicable Written Expression Dominant Hand: Right Written Expression: Exceptions to [facility] Dictation Ability: Sentence   Oral / Motor Oral Motor/Sensory Function Overall Oral Motor/Sensory Function: Appears within functional limits for tasks assessed Labial ROM: Within Functional Limits Labial Symmetry: Within Functional Limits Labial Strength: Within Functional Limits Lingual ROM: Within Functional Limits Lingual Symmetry: Within Functional Limits Lingual Strength: Within Functional Limits Motor Speech Overall Motor Speech: Appears within functional limits for tasks assessed   GO     Kern Reap, MA, CCC-SLP 11/28/2014, 2:13 PM

## 2014-11-28 NOTE — Progress Notes (Signed)
STROKE TEAM PROGRESS NOTE   SUBJECTIVE (INTERVAL HISTORY) His wife is at the bedside.  Overall he feels his condition is completely resolved. However, wife still complained that he seems slower to response than normal.   OBJECTIVE Temp:  [98.1 F (36.7 C)-98.6 F (37 C)] 98.1 F (36.7 C) (03/28 0433) Pulse Rate:  [72-83] 76 (03/28 0433) Cardiac Rhythm:  [-] Normal sinus rhythm (03/28 1100) Resp:  [16-20] 16 (03/28 0433) BP: (119-149)/(82-95) 149/95 mmHg (03/28 0433) SpO2:  [96 %-99 %] 96 % (03/28 0433)  No results for input(s): GLUCAP in the last 168 hours.  Recent Labs Lab 11/27/14 0845  NA 140  K 4.2  CL 101  CO2 30  GLUCOSE 110*  BUN 18  CREATININE 0.88  CALCIUM 9.5   No results for input(s): AST, ALT, ALKPHOS, BILITOT, PROT, ALBUMIN in the last 168 hours.  Recent Labs Lab 11/27/14 0845  WBC 5.1  HGB 15.0  HCT 42.2  MCV 91.5  PLT 180    Recent Labs Lab 11/27/14 1443  TROPONINI <0.03   No results for input(s): LABPROT, INR in the last 72 hours.  Recent Labs  11/27/14 2030  COLORURINE YELLOW  LABSPEC 1.019  PHURINE 7.0  GLUCOSEU NEGATIVE  HGBUR NEGATIVE  BILIRUBINUR NEGATIVE  KETONESUR NEGATIVE  PROTEINUR NEGATIVE  UROBILINOGEN 0.2  NITRITE NEGATIVE  LEUKOCYTESUR NEGATIVE       Component Value Date/Time   CHOL 214* 11/28/2014 0535   TRIG 211* 11/28/2014 0535   HDL 37* 11/28/2014 0535   CHOLHDL 5.8 11/28/2014 0535   VLDL 42* 11/28/2014 0535   LDLCALC 135* 11/28/2014 0535   No results found for: HGBA1C No results found for: LABOPIA, COCAINSCRNUR, LABBENZ, AMPHETMU, THCU, LABBARB   Recent Labs Lab 11/27/14 0854  ETH <5    I have personally reviewed the radiological images below and agree with the radiology interpretations.  Dg Chest 2 View  11/27/2014   IMPRESSION: No active cardiopulmonary disease.    Ct Head Wo Contrast  11/27/2014    IMPRESSION: Study within normal limits.     Mri and Mra Brain Wo Contrast  11/27/2014    IMPRESSION: 1. Acute infarcts in the left ACA, and left posterior MCA/PCA watershed area. Mild edema with no hemorrhage or mass effect. 2. Left ACA occlusion in the A2 segment. 3. Fetal type left PCA origin congruent with the above infarct pattern, but no left MCA or PCA branch occlusion identified. 4. Underlying otherwise fairly normal for age MRI appearance of the brain. Hypoplastic/absent right ACA A1 segment.   Carotid Doppler  Bilateral: 1-39% ICA stenosis. Vertebral artery flow is antegrade.  LE venous doppler - There is no DVT or SVT noted in the bilateral lower extremities.  2D Echocardiogram  - Left ventricle: The cavity size was normal. Wall thickness was normal. Systolic function was normal. The estimated ejection fraction was in the range of 55% to 60%. Wall motion was normal; there were no regional wall motion abnormalities. Doppler parameters are consistent with abnormal left ventricular relaxation (grade 1 diastolic dysfunction). Impressions - Normal LV function; grade 1 diastolic dysfunction; trace TR.  EKG  normal sinus rhythm. For complete results please see formal report.  PHYSICAL EXAM  Temp:  [98.1 F (36.7 C)-98.6 F (37 C)] 98.1 F (36.7 C) (03/28 0433) Pulse Rate:  [72-83] 76 (03/28 0433) Resp:  [16-20] 16 (03/28 0433) BP: (119-149)/(82-95) 149/95 mmHg (03/28 0433) SpO2:  [96 %-99 %] 96 % (03/28 0433)  General - Well nourished, well  developed, in no apparent distress.  Ophthalmologic - Sharp disc margins OU.  Cardiovascular - Regular rate and rhythm with no murmur.  Neck - supple, no carotid bruits  Mental Status -  Level of arousal and orientation to time, place, and person were intact. Language including expression, naming, repetition, comprehension was assessed and found intact. Attention span and concentration were normal, however, not able to calculate. Fund of Knowledge was assessed and was intact.  Cranial Nerves II - XII - II -  Visual field intact OU. III, IV, VI - Extraocular movements intact. V - Facial sensation intact bilaterally. VII - Facial movement intact bilaterally. VIII - Hearing & vestibular intact bilaterally. X - Palate elevates symmetrically. XI - Chin turning & shoulder shrug intact bilaterally. XII - Tongue protrusion intact.  Motor Strength - The patient's strength was normal in all extremities and pronator drift was absent.  Bulk was normal and fasciculations were absent.   Motor Tone - Muscle tone was assessed at the neck and appendages and was normal.  Reflexes - The patient's reflexes were symmetrical in all extremities and he had no pathological reflexes.  Sensory - Light touch, temperature/pinprick were assessed and were symmetrical    Coordination - The patient had normal movements in the hands and feet with no ataxia or dysmetria.  Tremor was absent.  Gait and Station - The patient's transfers, posture, gait, station, and turns were observed as normal.   ASSESSMENT/PLAN [name] is a 54 y.o. male with history of hypertension, hyperlipidemia and depression admitted for AMS and right upper extremity weakness. Symptoms resolved.    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  Carotid Doppler  unremarkable  2D Echo  Unremarkable  LE venous Doppler negative for DVT  TEE/loop pending  LDL 135, not at goal  HgbA1c pending  Hypercoagulable workup pending  Lovenox for VTE prophylaxis  Diet heart healthy/carb modified Room service appropriate?: Yes; Fluid consistency:: Thin   aspirin 81 mg orally every day prior to admission, now on aspirin 325 mg orally every day  Patient counseled to be compliant with his antithrombotic medications  Ongoing aggressive stroke risk factor management  Therapy recommendations:  Outpatient  OT  Disposition:  Home  Hypertension  Home meds:   Lisinopril  Permissive hypertension (OK if <220/120) for 24-48 hours post stroke and then gradually normalized within 5-7 days.  Currently on no BP meds  Stable  Patient counseled to be compliant with his blood pressure medications  Hyperlipidemia  Home meds:  None   LDL 136, goal < 70  Add Lipitor 80  Continue statin at discharge  Other Stroke Risk Factors  Family hx stroke (mother)  Other Active Problems  High triglycerides   Other Pertinent History  History of elevated PSA  Hospital day # 1   I have personally obtained the history, examined the patient, evaluated laboratory data, individually viewed imaging studies and agree with radiology interpretations. I also obtained additional history from pt's wife at bedside. I also discussed with Dr. Catha Gosselin regarding his care plan.    Marvel Plan, MD PhD Stroke Neurology 11/28/2014 2:04 PM    To contact Stroke Continuity provider, please refer to [email or web address]. After hours, contact General Neurology

## 2014-11-28 NOTE — Evaluation (Signed)
Occupational Therapy Evaluation Patient Details Name: Jesse Pacheco MRN: [id number] DOB: 28-Jan-1961 Today's Date: 11/28/2014    History of Present Illness 54 y.o. male admitted for stroke like symptoms, MRI and MRA of the brain reviewed and showed significant infarct involving both left ACA and MCA territory.   Clinical Impression   Pt was independent prior to admission.  Presents with impaired attention and cognition with flat affect compared to baseline.  Some mild inattention, peripheral field deficits on R. Pt with strength and coordination WNL on R, but demonstrates weakness in L UE which may be baseline as pt reports being treated for a "shoulder tear." Instructed pt and girlfriend on impact of high level IADL including driving and his job in [employer]. Educated in s/s of stroke and importance of fast response. Recommend OPOT.    Follow Up Recommendations  Outpatient OT (Neuro)   Equipment Recommendations       Recommendations for Other Services       Precautions / Restrictions Precautions Precautions: None Restrictions Weight Bearing Restrictions: No      Mobility Bed Mobility Overal bed mobility: Modified Independent                Transfers   Equipment used: None   Sit to Stand: Modified independent (Device/Increase time)         General transfer comment: from toilet and bed    Balance     Sitting balance-Leahy Scale: Normal       Standing balance-Leahy Scale: Good                              ADL Overall ADL's : Independent                                       General ADL Comments: Performed independently including toilet and tub transfer.     Vision Additional Comments: ? R peripheral field deficits   Perception     Praxis      Pertinent Vitals/Pain Pain Assessment: No/denies pain     Hand Dominance Right   Extremity/Trunk Assessment Upper Extremity Assessment Upper Extremity Assessment:  RUE deficits/detail;LUE deficits/detail RUE Deficits / Details: 5/5 strength, signature appears at baseline, no drift, able to perform finger to nose and thumb opposition without difficulty LUE Deficits / Details: 4/5 shoulder, 5/5 biceps, 3+/5 triceps, 4+/5 gross grasp and wrist extensors, Pt has been being treated for shoulder pain per girlfriend's report.   Lower Extremity Assessment Lower Extremity Assessment: Defer to PT evaluation       Communication Communication Communication: No difficulties   Cognition Arousal/Alertness: Awake/alert Behavior During Therapy: Flat affect Overall Cognitive Status: Impaired/Different from baseline Area of Impairment: Attention;Following commands;Awareness;Safety/judgement;Memory   Current Attention Level: Selective Memory: Decreased short-term memory Following Commands: Follows multi-step commands with increased time Safety/Judgement: Decreased awareness of deficits   Problem Solving: Slow processing;Decreased initiation General Comments: Per girlfriend, pt has been slower to respond and at times does not answer questions as he is normally more attentive.   General Comments       Exercises       Shoulder Instructions      Home Living Family/patient expects to be discharged to:: Private residence Living Arrangements: Spouse/significant other Available Help at Discharge: Family;Available 24 hours/day Type of Home: House Home Access: Stairs to enter [employer] of Steps: 2 Entrance  Stairs-Rails: Right;Left Home Layout: Two level;Able to live on main level with bedroom/bathroom     Bathroom Shower/Tub: Chief Strategy OfficerTub/shower unit   Bathroom Toilet: Standard     Home Equipment: None          Prior Functioning/Environment Level of Independence: Independent        Comments: Corporate investment bankerConstruction worker    OT Diagnosis: Cognitive deficits;Disturbance of vision   OT Problem List:     OT Treatment/Interventions:      OT  Goals(Current goals can be found in the care plan section) Acute Rehab OT Goals Patient Stated Goal: Get back to work  OT Frequency:     Barriers to D/C:            Co-evaluation              End of Session    Activity Tolerance: Patient tolerated treatment well Patient left: in bed;with family/visitor present;with call bell/phone within reach   Time: 1209-1245 OT Time Calculation (min): 36 min Charges:  OT General Charges $OT Visit: 1 Procedure OT Evaluation $Initial OT Evaluation Tier I: 1 Procedure OT Treatments $Self Care/Home Management : 8-22 mins G-Codes:    Evern BioMayberry, Lemonte Al Lynn 11/28/2014, 1:04 PM  [phone]

## 2014-11-28 NOTE — Progress Notes (Signed)
[facility]                                                                              Patient Demographics  Jesse Pacheco, is a 54 y.o. male, DOB - 1960/09/04, WUJ:[id number]  Admit date - 11/27/2014   Admitting Physician Eddie North, MD  Outpatient Primary MD for the patient is Kristian Covey, MD  LOS - 1   Chief Complaint  Patient presents with  . Chest Pain  . Altered Mental Status      HPI on 11/27/2014 by Jesse Pacheco with Jesse Pacheco Peder Allums is a 54 y.o. male, with a history of HTN, HLD and elevated PSA who presented to the ER today from home with stroke like symptoms. Jesse Pacheco reports that he has been feeling well. He got up this morning at approximately 6:30 am and went to the kitchen to get a glass of apple juice. He was walking back to the bedroom and notices that his arms and legs felt shaky. He sat down hard on the floor. His significant other heard a thud and came to check on him. He was unable to move his right leg or arm to stand. He had no bowel or urinary incontinence. She states that he was coherent but slow to respond to her questions, so she called 911. Afterward Jesse Pacheco states he doesn't remember much. EMS reports he was unable to move his right leg as well. In the ER his CT Pacheco is negative and labs look good. Neurology has seen him and felt he should be admitted for a stroke work up. He appeared to have a residual right sided visual field defect. Thrombolytics were considered but decided against as his symptoms were too mild and seem to be resolving.  Assessment & Plan   Acute left ACA/posterior MCA/PCA infarct -CT of the Pacheco negative -MRI/MRA of brain: Acute infarcts in the left ACA, left posterior MCA/PCA, left ACA occlusion in the A2 segment -Carotid Doppler: 1-39% ICA stenosis, vertebral artery flow is antegrade -Lower extremity Doppler: No DVT or SVT in the bilateral lower  extremities -Echocardiogram: EF 55-60%, grade 1 diastolic dysfunction, trace TR -Hemoglobin A1c pending -LDL 135 -PT and OT consulted and recommended outpatient neuro clinic -Continue aspirin, placed on statin  Hypertension -Lisinopril and chlorthalidone currently held, allowing for permissive hypertension -May restart home medications upon discharge  Hyperlipidemia -Lipid panel: Total cholesterol 214, TG 211, HDL 37, LDL 135 -Continue statin  Code Status: Full  Family Communication: Girlfriend at bedside  Disposition Plan: Admitted. Pending neurology recommendations.  Time Spent in minutes   30 minutes  Procedures  -Carotid Doppler: 1-39% ICA stenosis, vertebral artery flow is antegrade -Lower extremity Doppler: No DVT or SVT in the bilateral lower extremities -Echocardiogram: EF 55-60%, grade 1 diastolic dysfunction, trace TR  Consults   Neurology  DVT Prophylaxis  Lovenox  Lab Results  Component Value Date   PLT 180 11/27/2014    Medications  Scheduled Meds: . aspirin EC  325 mg Oral Daily  . atorvastatin  80 mg Oral q1800  . enoxaparin (LOVENOX) injection  40 mg Subcutaneous Q24H   Continuous Infusions:  PRN Meds:.gabapentin, hydrALAZINE, ibuprofen,  senna-docusate  Antibiotics    Anti-infectives    None      Subjective:   Kaisei Castner seen and examined today. Patient has no complaints this morning. Denies any chest pain or shortness of breath, dizziness or headache.  Objective:   Filed Vitals:   11/27/14 1428 11/27/14 1612 11/27/14 1958 11/28/14 0433  BP: 119/82 131/86 146/90 149/95  Pulse: 83 72 73 76  Temp: 98.2 F (36.8 C) 98.1 F (36.7 C) 98.6 F (37 C) 98.1 F (36.7 C)  TempSrc: Oral Oral Oral Oral  Resp: Height:      Weight:      SpO2: 99% 98% 97% 96%    Wt Readings from Last 3 Encounters:  11/27/14 90.719 kg (200 lb)  11/15/14 92.987 kg (205 lb)  11/14/14 92.534 kg (204 lb)     Intake/Output Summary (Last  24 hours) at 11/28/14 1350 Last data filed at 11/27/14 1900  Gross per 24 hour  Intake      0 ml  Output    200 ml  Net   -200 ml    Exam  General: Well developed, well nourished, NAD, appears stated age  HEENT: NCAT, PERRLA, EOMI, Anicteic Sclera, mucous membranes moist.   Cardiovascular: S1 S2 auscultated, no rubs, murmurs or gallops. Regular rate and rhythm.  Respiratory: Clear to auscultation bilaterally with equal chest rise  Abdomen: Soft, nontender, nondistended, + bowel sounds  Extremities: warm dry without cyanosis clubbing or edema  Neuro: AAOx3, cranial nerves grossly intact- ?right peripheral vision. Strength 5/5 in patient's upper and lower extremities bilaterally  Psych: Normal affect and demeanor with intact judgement and insight  Data Review   Micro Results No results found for this or any previous visit (from the past 240 hour(s)).  Radiology Reports Dg Chest 2 View  11/27/2014   CLINICAL DATA:  Stroke, weakness  EXAM: CHEST  2 VIEW  COMPARISON:  10/27/2011  FINDINGS: Cardiomediastinal silhouette is unremarkable. No acute infiltrate or pleural effusion. No pulmonary edema. Bony thorax is unremarkable.  IMPRESSION: No active cardiopulmonary disease.   Electronically Signed   By: Natasha Mead M.D.   On: 11/27/2014 12:40   Dg Cervical Spine Complete  11/14/2014   CLINICAL DATA:  LEFT shoulder pain for 2 months, prior neck fusion in April 2015  EXAM: CERVICAL SPINE  4+ VIEWS  COMPARISON:  12/15/2013  FINDINGS: Prevertebral soft tissues normal thickness.  Osseous mineralization grossly normal.  Prior anterior fusion of C C3-C6 with intact hardware and stable disc prostheses at each level.  Mild motion artifacts degrade RPO view.  Narrowing of neural foramina on RIGHT at C3-C4, C4-C5 and C5-C6 as well as LEFT C6-C7 by uncovertebral spurs.  No acute fracture, subluxation or bone destruction.  Lung apices clear.  IMPRESSION: Prior anterior fusion C3-C6.  Scattered narrowing  of neural foramina by uncovertebral spurs.  No acute abnormalities.   Electronically Signed   By: Ulyses Southward M.D.   On: 11/14/2014 09:12   Ct Pacheco Wo Contrast  11/27/2014   CLINICAL DATA:  Episode of confusion earlier today  EXAM: CT Pacheco WITHOUT CONTRAST  TECHNIQUE: Contiguous axial images were obtained from the base of the skull through the vertex without intravenous contrast.  COMPARISON:  July 19, 2008  FINDINGS: The ventricles are normal in size and configuration. There is no mass, hemorrhage, extra-axial fluid collection, or midline shift. Gray-white compartments are normal. No acute infarct apparent. Bony calvarium appears intact. The mastoid air  cells are clear.  IMPRESSION: Study within normal limits.   Electronically Signed   By: Bretta BangWilliam  Woodruff III M.D.   On: 11/27/2014 09:57   Mr Brain Wo Contrast  11/27/2014   CLINICAL DATA:  54 year old male last seen normal at 0645 hours. Acute altered mental status and right side weakness. TIA versus stroke. Initial encounter.  EXAM: MRI Pacheco WITHOUT CONTRAST  MRA Pacheco WITHOUT CONTRAST  TECHNIQUE: Multiplanar, multiecho pulse sequences of the brain and surrounding structures were obtained without intravenous contrast. Angiographic images of the Pacheco were obtained using MRA technique without contrast.  COMPARISON:  Pacheco CT without contrast 0945 hours.  FINDINGS: MRI Pacheco FINDINGS  Patchy restricted diffusion in the left ACA territory affecting the cingulate gyrus and anterior left frontal lobe and subcortical white matter. Similar cortically based patchy restricted diffusion then in the posterior most left MCA/left PCA watershed territory (series 3, image 23). Mild associated T2 and FLAIR hyperintensity in both areas with mild gyral edema. No significant mass effect. No evidence of associated hemorrhage at this time.  Major intracranial vascular flow voids are preserved, see MRA findings below.  No contralateral or posterior fossa restricted diffusion. No  ventriculomegaly. No midline shift, mass effect, or evidence of intracranial mass lesion. No acute intracranial hemorrhage identified. Negative pituitary and cervicomedullary junction. Visible cervical spine remarkable for previous ACDF. Outside the areas of acute signal abnormality there is only minimal to mild for age scattered small cerebral white matter T2 and FLAIR hyperintensity. No cortical encephalomalacia identified. Negative deep gray matter nuclei, brainstem and cerebellum.  Visible internal auditory structures appear normal. Visualized paranasal sinuses and mastoids are clear. Visualized orbit soft tissues are within normal limits. Visualized scalp soft tissues are within normal limits. Normal bone marrow signal.  MRA Pacheco FINDINGS  Antegrade flow in the posterior circulation with dominant distal left vertebral artery. The distal right vertebral artery functionally terminates in PICA. Normal left PICA origin. No basilar stenosis. SCA and right PCA origins are normal. Fetal type left PCA origin.  No left PCA branch occlusion is identified.  Antegrade flow in both ICA siphons, the left is dominant and mildly dolichoectatic. No siphon stenosis. Ophthalmic and left posterior communicating artery origins are within normal limits. Right posterior communicating artery is diminutive or absent.  At the carotid termini the right ACA A1 segment is hypoplastic or absent. Normal MCA origins. Visualized right MCA branches are within normal limits.  Normal left ACA origin. Normal anterior communicating artery. Visible right ACA branches are within normal limits, but the left ACA is occluded in the A2 segment (series 403, image 6).  The left MCA M1 segment and bifurcation appear normal. Visualized left MCA branches are within normal limits.  IMPRESSION: 1. Acute infarcts in the left ACA, and left posterior MCA/PCA watershed area. Mild edema with no hemorrhage or mass effect. 2. Left ACA occlusion in the A2 segment. 3.  Fetal type left PCA origin congruent with the above infarct pattern, but no left MCA or PCA branch occlusion identified. 4. Underlying otherwise fairly normal for age MRI appearance of the brain. Hypoplastic/absent right ACA A1 segment. Study discussed by telephone with Neurology Dr. Noel Christmasharles Stewart on 11/27/2014 at 1416 hours.   Electronically Signed   By: Odessa FlemingH  Hall M.D.   On: 11/27/2014 14:23   Dg Shoulder Left  11/14/2014   CLINICAL DATA:  54 year old male with left shoulder pain for 2 months. No known injury. Prior cervical spine surgery in 2015. Initial encounter.  EXAM: LEFT  SHOULDER - 2+ VIEW  COMPARISON:  None.  FINDINGS: No glenohumeral joint dislocation. Bone mineralization is within normal limits. Proximal left humerus appears intact. Left clavicle and scapula appear intact. Visualized lung parenchyma within normal limits. Evidence of chronic/healed left posterior lateral fifth through seventh rib fractures. Partially visible cervical ACDF hardware.  IMPRESSION: No acute osseous abnormality identified about the left shoulder.   Electronically Signed   By: Odessa Fleming M.D.   On: 11/14/2014 09:30   Mr Maxine Glenn Pacheco/brain Wo Cm  11/27/2014   CLINICAL DATA:  54 year old male last seen normal at 0645 hours. Acute altered mental status and right side weakness. TIA versus stroke. Initial encounter.  EXAM: MRI Pacheco WITHOUT CONTRAST  MRA Pacheco WITHOUT CONTRAST  TECHNIQUE: Multiplanar, multiecho pulse sequences of the brain and surrounding structures were obtained without intravenous contrast. Angiographic images of the Pacheco were obtained using MRA technique without contrast.  COMPARISON:  Pacheco CT without contrast 0945 hours.  FINDINGS: MRI Pacheco FINDINGS  Patchy restricted diffusion in the left ACA territory affecting the cingulate gyrus and anterior left frontal lobe and subcortical white matter. Similar cortically based patchy restricted diffusion then in the posterior most left MCA/left PCA watershed territory (series  3, image 23). Mild associated T2 and FLAIR hyperintensity in both areas with mild gyral edema. No significant mass effect. No evidence of associated hemorrhage at this time.  Major intracranial vascular flow voids are preserved, see MRA findings below.  No contralateral or posterior fossa restricted diffusion. No ventriculomegaly. No midline shift, mass effect, or evidence of intracranial mass lesion. No acute intracranial hemorrhage identified. Negative pituitary and cervicomedullary junction. Visible cervical spine remarkable for previous ACDF. Outside the areas of acute signal abnormality there is only minimal to mild for age scattered small cerebral white matter T2 and FLAIR hyperintensity. No cortical encephalomalacia identified. Negative deep gray matter nuclei, brainstem and cerebellum.  Visible internal auditory structures appear normal. Visualized paranasal sinuses and mastoids are clear. Visualized orbit soft tissues are within normal limits. Visualized scalp soft tissues are within normal limits. Normal bone marrow signal.  MRA Pacheco FINDINGS  Antegrade flow in the posterior circulation with dominant distal left vertebral artery. The distal right vertebral artery functionally terminates in PICA. Normal left PICA origin. No basilar stenosis. SCA and right PCA origins are normal. Fetal type left PCA origin.  No left PCA branch occlusion is identified.  Antegrade flow in both ICA siphons, the left is dominant and mildly dolichoectatic. No siphon stenosis. Ophthalmic and left posterior communicating artery origins are within normal limits. Right posterior communicating artery is diminutive or absent.  At the carotid termini the right ACA A1 segment is hypoplastic or absent. Normal MCA origins. Visualized right MCA branches are within normal limits.  Normal left ACA origin. Normal anterior communicating artery. Visible right ACA branches are within normal limits, but the left ACA is occluded in the A2 segment  (series 403, image 6).  The left MCA M1 segment and bifurcation appear normal. Visualized left MCA branches are within normal limits.  IMPRESSION: 1. Acute infarcts in the left ACA, and left posterior MCA/PCA watershed area. Mild edema with no hemorrhage or mass effect. 2. Left ACA occlusion in the A2 segment. 3. Fetal type left PCA origin congruent with the above infarct pattern, but no left MCA or PCA branch occlusion identified. 4. Underlying otherwise fairly normal for age MRI appearance of the brain. Hypoplastic/absent right ACA A1 segment. Study discussed by telephone with Neurology Dr. Noel Christmas on  11/27/2014 at 1416 hours.   Electronically Signed   By: Odessa Fleming M.D.   On: 11/27/2014 14:23    CBC  Recent Labs Lab 11/27/14 0845  WBC 5.1  HGB 15.0  HCT 42.2  PLT 180  MCV 91.5  MCH 32.5  MCHC 35.5  RDW 13.0    Chemistries   Recent Labs Lab 11/27/14 0845  NA 140  K 4.2  CL 101  CO2 30  GLUCOSE 110*  BUN 18  CREATININE 0.88  CALCIUM 9.5   ------------------------------------------------------------------------------------------------------------------ estimated creatinine clearance is 107.5 mL/min (by C-G formula based on Cr of 0.88). ------------------------------------------------------------------------------------------------------------------ No results for input(s): HGBA1C in the last 72 hours. ------------------------------------------------------------------------------------------------------------------  Recent Labs  11/28/14 0535  CHOL 214*  HDL 37*  LDLCALC 135*  TRIG 211*  CHOLHDL 5.8   ------------------------------------------------------------------------------------------------------------------ No results for input(s): TSH, T4TOTAL, T3FREE, THYROIDAB in the last 72 hours.  Invalid input(s): FREET3 ------------------------------------------------------------------------------------------------------------------ No results for input(s):  VITAMINB12, FOLATE, FERRITIN, TIBC, IRON, RETICCTPCT in the last 72 hours.  Coagulation profile No results for input(s): INR, PROTIME in the last 168 hours.  No results for input(s): DDIMER in the last 72 hours.  Cardiac Enzymes  Recent Labs Lab 11/27/14 1443  TROPONINI <0.03   ------------------------------------------------------------------------------------------------------------------ Invalid input(s): POCBNP    Tynika Luddy D.O. on 11/28/2014 at 1:50 PM  Between 7am to 7pm - Pager - [phone]  After 7pm go to [email or web address] - password [email or web address]  And look for the night coverage person covering for me after hours  [facility] Office  [phone]

## 2014-11-28 NOTE — Progress Notes (Signed)
Provided education on new medication Atorvastatin, patient and girl friend of potential side effects & contraindications, hand out was provided to patient, both verbalized understanding, no questions at this time.   Toya SmothersLy Teila Skalsky, RN

## 2014-11-29 ENCOUNTER — Encounter (HOSPITAL_COMMUNITY)
Admission: EM | Disposition: A | Discharge status: Home / Self Care | Payer: Self-pay | Source: Home / Self Care | Attending: Internal Medicine

## 2014-11-29 ENCOUNTER — Encounter (HOSPITAL_COMMUNITY): Payer: Self-pay | Admitting: *Deleted

## 2014-11-29 DIAGNOSIS — I639 Cerebral infarction, unspecified: Secondary | ICD-10-CM

## 2014-11-29 HISTORY — PX: TEE WITHOUT CARDIOVERSION: SHX5443

## 2014-11-29 HISTORY — PX: LOOP RECORDER IMPLANT: SHX5477

## 2014-11-29 LAB — RAPID URINE DRUG SCREEN, HOSP PERFORMED
Amphetamines: NOT DETECTED
Barbiturates: NOT DETECTED
Benzodiazepines: NOT DETECTED
Cocaine: NOT DETECTED
Opiates: NOT DETECTED
TETRAHYDROCANNABINOL: NOT DETECTED

## 2014-11-29 LAB — HEMOGLOBIN A1C
Hgb A1c MFr Bld: 5.5 % (ref 4.8–5.6)
Mean Plasma Glucose: 111 mg/dL

## 2014-11-29 LAB — BETA-2-GLYCOPROTEIN I ABS, IGG/M/A: Beta-2-Glycoprotein I IgA: <9 GPI IgA units (ref 0–25)

## 2014-11-29 SURGERY — LOOP RECORDER IMPLANT

## 2014-11-29 SURGERY — ECHOCARDIOGRAM, TRANSESOPHAGEAL
Anesthesia: Moderate Sedation

## 2014-11-29 MED ORDER — MIDAZOLAM HCL 5 MG/ML IJ SOLN
INTRAMUSCULAR | Status: AC
  Filled 2014-11-29: qty 2

## 2014-11-29 MED ORDER — MIDAZOLAM HCL 10 MG/2ML IJ SOLN
INTRAMUSCULAR | Status: DC | PRN
Start: 2014-11-29 — End: 2014-11-29
  Administered 2014-11-29 (×3): 2 mg via INTRAVENOUS

## 2014-11-29 MED ORDER — LIDOCAINE-EPINEPHRINE 1 %-1:100000 IJ SOLN
INTRAMUSCULAR | Status: AC
  Filled 2014-11-29: qty 1

## 2014-11-29 MED ORDER — FENTANYL CITRATE 0.05 MG/ML IJ SOLN
INTRAMUSCULAR | Status: AC
  Filled 2014-11-29: qty 2

## 2014-11-29 MED ORDER — LISINOPRIL 40 MG PO TABS
40.0000 mg | ORAL_TABLET | Freq: Every day | ORAL | Status: DC
  Administered 2014-11-29: 40 mg via ORAL
  Filled 2014-11-29: qty 1

## 2014-11-29 MED ORDER — STROKE: EARLY STAGES OF RECOVERY BOOK
Freq: Once | Status: AC
  Administered 2014-11-29: 13:00:00
  Filled 2014-11-29: qty 1

## 2014-11-29 MED ORDER — ATORVASTATIN CALCIUM 80 MG PO TABS: 80.0000 mg | ORAL_TABLET | Freq: Every day | ORAL | Status: DC

## 2014-11-29 MED ORDER — CHLORTHALIDONE 25 MG PO TABS
25.0000 mg | ORAL_TABLET | Freq: Every day | ORAL | Status: DC
  Administered 2014-11-29: 25 mg via ORAL
  Filled 2014-11-29: qty 1

## 2014-11-29 MED ORDER — FENTANYL CITRATE 0.05 MG/ML IJ SOLN
INTRAMUSCULAR | Status: DC | PRN
  Administered 2014-11-29 (×3): 25 ug via INTRAVENOUS

## 2014-11-29 MED ORDER — BUTAMBEN-TETRACAINE-BENZOCAINE 2-2-14 % EX AERO
INHALATION_SPRAY | CUTANEOUS | Status: DC | PRN
Start: 2014-11-29 — End: 2014-11-29
  Administered 2014-11-29: 2 via TOPICAL

## 2014-11-29 MED ORDER — DIPHENHYDRAMINE HCL 50 MG/ML IJ SOLN
INTRAMUSCULAR | Status: AC
  Filled 2014-11-29: qty 1

## 2014-11-29 NOTE — Interval H&P Note (Signed)
History and Physical Interval Note:  11/29/2014 9:56 AM  Jesse Pacheco  has presented today for surgery, with the diagnosis of stroke  The various methods of treatment have been discussed with the patient and family. After consideration of risks, benefits and other options for treatment, the patient has consented to  Procedure(s): LOOP RECORDER IMPLANT (N/A) as a surgical intervention .  The patient's history has been reviewed, patient examined, no change in status, stable for surgery.  I have reviewed the patient's chart and labs.  Questions were answered to the patient's satisfaction.     Hillis RangeJames Lequisha Cammack

## 2014-11-29 NOTE — H&P (View-Only) (Signed)
STROKE TEAM PROGRESS NOTE   SUBJECTIVE (INTERVAL HISTORY) His wife is at the bedside.  Overall he feels his condition is completely resolved. However, wife still complained that he seems slower to response than normal.   OBJECTIVE Temp:  [98.1 F (36.7 C)-98.6 F (37 C)] 98.1 F (36.7 C) (03/28 0433) Pulse Rate:  [72-83] 76 (03/28 0433) Cardiac Rhythm:  [-] Normal sinus rhythm (03/28 1100) Resp:  [16-20] 16 (03/28 0433) BP: (119-149)/(82-95) 149/95 mmHg (03/28 0433) SpO2:  [96 %-99 %] 96 % (03/28 0433)  No results for input(s): GLUCAP in the last 168 hours.  Recent Labs Lab 11/27/14 0845  NA 140  K 4.2  CL 101  CO2 30  GLUCOSE 110*  BUN 18  CREATININE 0.88  CALCIUM 9.5   No results for input(s): AST, ALT, ALKPHOS, BILITOT, PROT, ALBUMIN in the last 168 hours.  Recent Labs Lab 11/27/14 0845  WBC 5.1  HGB 15.0  HCT 42.2  MCV 91.5  PLT 180    Recent Labs Lab 11/27/14 1443  TROPONINI <0.03   No results for input(s): LABPROT, INR in the last 72 hours.  Recent Labs  11/27/14 2030  COLORURINE YELLOW  LABSPEC 1.019  PHURINE 7.0  GLUCOSEU NEGATIVE  HGBUR NEGATIVE  BILIRUBINUR NEGATIVE  KETONESUR NEGATIVE  PROTEINUR NEGATIVE  UROBILINOGEN 0.2  NITRITE NEGATIVE  LEUKOCYTESUR NEGATIVE       Component Value Date/Time   CHOL 214* 11/28/2014 0535   TRIG 211* 11/28/2014 0535   HDL 37* 11/28/2014 0535   CHOLHDL 5.8 11/28/2014 0535   VLDL 42* 11/28/2014 0535   LDLCALC 135* 11/28/2014 0535   No results found for: HGBA1C No results found for: LABOPIA, COCAINSCRNUR, LABBENZ, AMPHETMU, THCU, LABBARB   Recent Labs Lab 11/27/14 0854  ETH <5    I have personally reviewed the radiological images below and agree with the radiology interpretations.  Dg Chest 2 View  11/27/2014   IMPRESSION: No active cardiopulmonary disease.    Ct Head Wo Contrast  11/27/2014    IMPRESSION: Study within normal limits.     Mri and Mra Brain Wo Contrast  11/27/2014    IMPRESSION: 1. Acute infarcts in the left ACA, and left posterior MCA/PCA watershed area. Mild edema with no hemorrhage or mass effect. 2. Left ACA occlusion in the A2 segment. 3. Fetal type left PCA origin congruent with the above infarct pattern, but no left MCA or PCA branch occlusion identified. 4. Underlying otherwise fairly normal for age MRI appearance of the brain. Hypoplastic/absent right ACA A1 segment.   Carotid Doppler  Bilateral: 1-39% ICA stenosis. Vertebral artery flow is antegrade.  LE venous doppler - There is no DVT or SVT noted in the bilateral lower extremities.  2D Echocardiogram  - Left ventricle: The cavity size was normal. Wall thickness was normal. Systolic function was normal. The estimated ejection fraction was in the range of 55% to 60%. Wall motion was normal; there were no regional wall motion abnormalities. Doppler parameters are consistent with abnormal left ventricular relaxation (grade 1 diastolic dysfunction). Impressions - Normal LV function; grade 1 diastolic dysfunction; trace TR.  EKG  normal sinus rhythm. For complete results please see formal report.  PHYSICAL EXAM  Temp:  [98.1 F (36.7 C)-98.6 F (37 C)] 98.1 F (36.7 C) (03/28 0433) Pulse Rate:  [72-83] 76 (03/28 0433) Resp:  [16-20] 16 (03/28 0433) BP: (119-149)/(82-95) 149/95 mmHg (03/28 0433) SpO2:  [96 %-99 %] 96 % (03/28 0433)  General - Well nourished, well   developed, in no apparent distress.  Ophthalmologic - Sharp disc margins OU.  Cardiovascular - Regular rate and rhythm with no murmur.  Neck - supple, no carotid bruits  Mental Status -  Level of arousal and orientation to time, place, and person were intact. Language including expression, naming, repetition, comprehension was assessed and found intact. Attention span and concentration were normal, however, not able to calculate. Fund of Knowledge was assessed and was intact.  Cranial Nerves II - XII - II -  Visual field intact OU. III, IV, VI - Extraocular movements intact. V - Facial sensation intact bilaterally. VII - Facial movement intact bilaterally. VIII - Hearing & vestibular intact bilaterally. X - Palate elevates symmetrically. XI - Chin turning & shoulder shrug intact bilaterally. XII - Tongue protrusion intact.  Motor Strength - The patient's strength was normal in all extremities and pronator drift was absent.  Bulk was normal and fasciculations were absent.   Motor Tone - Muscle tone was assessed at the neck and appendages and was normal.  Reflexes - The patient's reflexes were symmetrical in all extremities and he had no pathological reflexes.  Sensory - Light touch, temperature/pinprick were assessed and were symmetrical    Coordination - The patient had normal movements in the hands and feet with no ataxia or dysmetria.  Tremor was absent.  Gait and Station - The patient's transfers, posture, gait, station, and turns were observed as normal.   ASSESSMENT/PLAN Jesse Pacheco is a 54 y.o. male with history of hypertension, hyperlipidemia and depression admitted for AMS and right upper extremity weakness. Symptoms resolved.    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  Carotid Doppler  unremarkable  2D Echo  Unremarkable  LE venous Doppler negative for DVT  TEE/loop pending  LDL 135, not at goal  HgbA1c pending  Hypercoagulable workup pending  Lovenox for VTE prophylaxis  Diet heart healthy/carb modified Room service appropriate?: Yes; Fluid consistency:: Thin   aspirin 81 mg orally every day prior to admission, now on aspirin 325 mg orally every day  Patient counseled to be compliant with his antithrombotic medications  Ongoing aggressive stroke risk factor management  Therapy recommendations:  Outpatient  OT  Disposition:  Home  Hypertension  Home meds:   Lisinopril  Permissive hypertension (OK if <220/120) for 24-48 hours post stroke and then gradually normalized within 5-7 days.  Currently on no BP meds  Stable  Patient counseled to be compliant with his blood pressure medications  Hyperlipidemia  Home meds:  None   LDL 136, goal < 70  Add Lipitor 80  Continue statin at discharge  Other Stroke Risk Factors  Family hx stroke (mother)  Other Active Problems  High triglycerides   Other Pertinent History  History of elevated PSA  Hospital day # 1   I have personally obtained the history, examined the patient, evaluated laboratory data, individually viewed imaging studies and agree with radiology interpretations. I also obtained additional history from pt's wife at bedside. I also discussed with Dr. Mikhail regarding his care plan.    Jesse Bayard, MD PhD Stroke Neurology 11/28/2014 2:04 PM    To contact Stroke Continuity provider, please refer to [email or web address]. After hours, contact General Neurology  

## 2014-11-29 NOTE — CV Procedure (Signed)
    TEE  Indication: Stroke  Findings:  No thrombus, no mass Positive bubble study with atrial septal aneurysm consistent with PFO (patent foramen ovale).  Mild MR/TR  Normal EF  Donato SchultzSKAINS, MARK, MD

## 2014-11-29 NOTE — CV Procedure (Signed)
SURGEON:  Hillis RangeJames Vylet Maffia, MD     PREPROCEDURE DIAGNOSIS:  Cryptogenic Stroke    POSTPROCEDURE DIAGNOSIS:  Cryptogenic Stroke     PROCEDURES:   1. Implantable loop recorder implantation    INTRODUCTION:  Jesse Pacheco is a 54 y.o. male with a history of unexplained stroke who presents today for implantable loop implantation.  The patient has had a cryptogenic stroke.  Despite an extensive workup by neurology, no reversible causes have been identified.  he has worn telemetry during which he did not have arrhythmias.  There is significant concern for possible atrial fibrillation as the cause for the patients stroke.  The patient therefore presents today for implantable loop implantation.     DESCRIPTION OF PROCEDURE:  Informed written consent was obtained, and the patient was brought to the electrophysiology lab in a fasting state.  The patient required no sedation for the procedure today.  Mapping over the patient's chest was performed by the EP lab staff to identify the area where electrograms were most prominent for ILR recording.  This area was found to be the left parasternal region over the 3rd-4th intercostal space. The patients left chest was therefore prepped and draped in the usual sterile fashion by the EP lab staff. The skin overlying the left parasternal region was infiltrated with lidocaine for local analgesia.  A 0.5-cm incision was made over the left parasternal region over the 3rd intercostal space.  A subcutaneous ILR pocket was fashioned using a combination of sharp and blunt dissection.  A Medtronic Reveal [place] model [id number] S implantable loop recorder was then placed into the pocket  R waves were very prominent and measured 0.6726mV. EBL<1 ml.  Steri- Strips and a sterile dressing were then applied.  There were no early apparent complications.     CONCLUSIONS:   1. Successful implantation of a Medtronic Reveal LINQ implantable loop recorder for cryptogenic stroke  2. No  early apparent complications.   Hillis RangeJames Egan Sahlin MD, [facility] 11/29/2014 10:49 AM

## 2014-11-29 NOTE — Interval H&P Note (Signed)
History and Physical Interval Note:  11/29/2014 9:10 AM  Jesse Pacheco  has presented today for surgery, with the diagnosis of stroke  The various methods of treatment have been discussed with the patient and family. After consideration of risks, benefits and other options for treatment, the patient has consented to  Procedure(s): TRANSESOPHAGEAL ECHOCARDIOGRAM (TEE) (N/A) as a surgical intervention .  The patient's history has been reviewed, patient examined, no change in status, stable for surgery.  I have reviewed the patient's chart and labs.  Questions were answered to the patient's satisfaction.     Joli Koob

## 2014-11-29 NOTE — Consult Note (Addendum)
ELECTROPHYSIOLOGY CONSULT NOTE  Patient ID: Jesse Pacheco MRN: [id number], DOB/AGE: June 30, 1961   Admit date: 11/27/2014 Date of Consult: 11/29/2014  Primary Physician: Kristian CoveyBURCHETTE,BRUCE W, MD  Reason for Consultation: Cryptogenic stroke; recommendations regarding Implantable Loop Recorder  History of Present Illness Jesse Pacheco was admitted on 11/27/2014 with acute CVA. he has been monitored on telemetry which has demonstrated no arrhythmias. No cause has been identified. Inpatient stroke work-up is to be completed with a TEE. EP has been asked to evaluate for placement of an implantable loop recorder to monitor for atrial fibrillation.  Past Medical History Past Medical History  Diagnosis Date  . HYPERTENSION 01/09/2009  . VIRAL URI 03/22/2010  . 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 Nocturia 01/09/2009  . Depression     Past Surgical History Past Surgical History  Procedure Laterality Date  . Appendectomy    . Spine surgery  2006, 2009    laminectomy    Allergies/Intolerances No Known Allergies Inpatient Medications . aspirin EC  325 mg Oral Daily  . atorvastatin  80 mg Oral q1800  . enoxaparin (LOVENOX) injection  40 mg Subcutaneous Q24H   . sodium chloride 20 mL/hr at 11/28/14 2000   Social History History   Social History  . Marital Status: Single    Spouse Name: N/A  . Number of Children: N/A  . Years of Education: N/A   Occupational History  . Not on file.   Social History Main Topics  . Smoking status: Never Smoker   . Smokeless tobacco: Not on file  . Alcohol Use: Yes     Comment: drinks 3-4 times a week  . Drug Use: No  . Sexual Activity: Not on file   Other Topics Concern  . Not on file   Social History Narrative    Review of Systems General: No chills, fever, night sweats or weight changes  Cardiovascular:  No chest pain, dyspnea on exertion, edema, orthopnea, palpitations, paroxysmal nocturnal dyspnea Dermatological: No rash, lesions  or masses Respiratory: No cough, dyspnea Urologic: No hematuria, dysuria Abdominal: No nausea, vomiting, diarrhea, bright red blood per rectum, melena, or hematemesis Neurologic: No visual changes, weakness, changes in mental status All other systems reviewed and are otherwise negative except as noted above.  Physical Exam Blood pressure 158/98, pulse 88, temperature 98.2 F (36.8 C), temperature source Oral, resp. rate 20, height 5\' 10"  (1.778 m), weight 200 lb (90.719 kg), SpO2 95 %.  General: Well developed, well appearing 54 y.o. male in no acute distress. HEENT: Normocephalic, atraumatic. EOMs intact. Sclera nonicteric. Oropharynx clear.  Neck: Supple without bruits. No JVD. Lungs: Respirations regular and unlabored, CTA bilaterally. No wheezes, rales or rhonchi. Heart: RRR. S1, S2 present. No murmurs, rub, S3 or S4. Abdomen: Soft, non-tender, non-distended. BS present x 4 quadrants. No hepatosplenomegaly.  Extremities: No clubbing, cyanosis or edema. DP/PT/Radials 2+ and equal bilaterally. Psych: Normal affect. Neuro: Alert and oriented X 3. Moves all extremities spontaneously. Musculoskeletal: No kyphosis. Skin: Intact. Warm and dry. No rashes or petechiae in exposed areas.   Labs Lab Results  Component Value Date   WBC 5.1 11/27/2014   HGB 15.0 11/27/2014   HCT 42.2 11/27/2014   MCV 91.5 11/27/2014   PLT 180 11/27/2014    Recent Labs Lab 11/27/14 0845  NA 140  K 4.2  CL 101  CO2 30  BUN 18  CREATININE 0.88  CALCIUM 9.5  GLUCOSE 110*   No results for input(s): INR in the last 72 hours.  Radiology/Studies Dg Chest 2 View  11/27/2014   CLINICAL DATA:  Stroke, weakness  EXAM: CHEST  2 VIEW  COMPARISON:  10/27/2011  FINDINGS: Cardiomediastinal silhouette is unremarkable. No acute infiltrate or pleural effusion. No pulmonary edema. Bony thorax is unremarkable.  IMPRESSION: No active cardiopulmonary disease.   Electronically Signed   By: Natasha MeadLiviu  Pop M.D.   On:  11/27/2014 12:40   Dg Cervical Spine Complete  11/14/2014   CLINICAL DATA:  LEFT shoulder pain for 2 months, prior neck fusion in April 2015  EXAM: CERVICAL SPINE  4+ VIEWS  COMPARISON:  12/15/2013  FINDINGS: Prevertebral soft tissues normal thickness.  Osseous mineralization grossly normal.  Prior anterior fusion of C C3-C6 with intact hardware and stable disc prostheses at each level.  Mild motion artifacts degrade RPO view.  Narrowing of neural foramina on RIGHT at C3-C4, C4-C5 and C5-C6 as well as LEFT C6-C7 by uncovertebral spurs.  No acute fracture, subluxation or bone destruction.  Lung apices clear.  IMPRESSION: Prior anterior fusion C3-C6.  Scattered narrowing of neural foramina by uncovertebral spurs.  No acute abnormalities.   Electronically Signed   By: Ulyses SouthwardMark  Boles M.D.   On: 11/14/2014 09:12   Ct Head Wo Contrast  11/27/2014   CLINICAL DATA:  Episode of confusion earlier today  EXAM: CT HEAD WITHOUT CONTRAST  TECHNIQUE: Contiguous axial images were obtained from the base of the skull through the vertex without intravenous contrast.  COMPARISON:  July 19, 2008  FINDINGS: The ventricles are normal in size and configuration. There is no mass, hemorrhage, extra-axial fluid collection, or midline shift. Gray-white compartments are normal. No acute infarct apparent. Bony calvarium appears intact. The mastoid air cells are clear.  IMPRESSION: Study within normal limits.   Electronically Signed   By: Bretta BangWilliam  Woodruff III M.D.   On: 11/27/2014 09:57   Mr Brain Wo Contrast  11/27/2014   CLINICAL DATA:  54 year old male last seen normal at 0645 hours. Acute altered mental status and right side weakness. TIA versus stroke. Initial encounter.  EXAM: MRI HEAD WITHOUT CONTRAST  MRA HEAD WITHOUT CONTRAST  TECHNIQUE: Multiplanar, multiecho pulse sequences of the brain and surrounding structures were obtained without intravenous contrast. Angiographic images of the head were obtained using MRA technique  without contrast.  COMPARISON:  Head CT without contrast 0945 hours.  FINDINGS: MRI HEAD FINDINGS  Patchy restricted diffusion in the left ACA territory affecting the cingulate gyrus and anterior left frontal lobe and subcortical white matter. Similar cortically based patchy restricted diffusion then in the posterior most left MCA/left PCA watershed territory (series 3, image 23). Mild associated T2 and FLAIR hyperintensity in both areas with mild gyral edema. No significant mass effect. No evidence of associated hemorrhage at this time.  Major intracranial vascular flow voids are preserved, see MRA findings below.  No contralateral or posterior fossa restricted diffusion. No ventriculomegaly. No midline shift, mass effect, or evidence of intracranial mass lesion. No acute intracranial hemorrhage identified. Negative pituitary and cervicomedullary junction. Visible cervical spine remarkable for previous ACDF. Outside the areas of acute signal abnormality there is only minimal to mild for age scattered small cerebral white matter T2 and FLAIR hyperintensity. No cortical encephalomalacia identified. Negative deep gray matter nuclei, brainstem and cerebellum.  Visible internal auditory structures appear normal. Visualized paranasal sinuses and mastoids are clear. Visualized orbit soft tissues are within normal limits. Visualized scalp soft tissues are within normal limits. Normal bone marrow signal.  MRA HEAD FINDINGS  Antegrade flow in  the posterior circulation with dominant distal left vertebral artery. The distal right vertebral artery functionally terminates in PICA. Normal left PICA origin. No basilar stenosis. SCA and right PCA origins are normal. Fetal type left PCA origin.  No left PCA branch occlusion is identified.  Antegrade flow in both ICA siphons, the left is dominant and mildly dolichoectatic. No siphon stenosis. Ophthalmic and left posterior communicating artery origins are within normal limits. Right  posterior communicating artery is diminutive or absent.  At the carotid termini the right ACA A1 segment is hypoplastic or absent. Normal MCA origins. Visualized right MCA branches are within normal limits.  Normal left ACA origin. Normal anterior communicating artery. Visible right ACA branches are within normal limits, but the left ACA is occluded in the A2 segment (series 403, image 6).  The left MCA M1 segment and bifurcation appear normal. Visualized left MCA branches are within normal limits.  IMPRESSION: 1. Acute infarcts in the left ACA, and left posterior MCA/PCA watershed area. Mild edema with no hemorrhage or mass effect. 2. Left ACA occlusion in the A2 segment. 3. Fetal type left PCA origin congruent with the above infarct pattern, but no left MCA or PCA branch occlusion identified. 4. Underlying otherwise fairly normal for age MRI appearance of the brain. Hypoplastic/absent right ACA A1 segment. Study discussed by telephone with Neurology Dr. Noel Christmas on 11/27/2014 at 1416 hours.   Electronically Signed   By: Odessa Fleming M.D.   On: 11/27/2014 14:23   Dg Shoulder Left  11/14/2014   CLINICAL DATA:  54 year old male with left shoulder pain for 2 months. No known injury. Prior cervical spine surgery in 2015. Initial encounter.  EXAM: LEFT SHOULDER - 2+ VIEW  COMPARISON:  None.  FINDINGS: No glenohumeral joint dislocation. Bone mineralization is within normal limits. Proximal left humerus appears intact. Left clavicle and scapula appear intact. Visualized lung parenchyma within normal limits. Evidence of chronic/healed left posterior lateral fifth through seventh rib fractures. Partially visible cervical ACDF hardware.  IMPRESSION: No acute osseous abnormality identified about the left shoulder.   Electronically Signed   By: Odessa Fleming M.D.   On: 11/14/2014 09:30   Mr Maxine Glenn Head/brain Wo Cm  11/27/2014   CLINICAL DATA:  54 year old male last seen normal at 0645 hours. Acute altered mental status and right  side weakness. TIA versus stroke. Initial encounter.  EXAM: MRI HEAD WITHOUT CONTRAST  MRA HEAD WITHOUT CONTRAST  TECHNIQUE: Multiplanar, multiecho pulse sequences of the brain and surrounding structures were obtained without intravenous contrast. Angiographic images of the head were obtained using MRA technique without contrast.  COMPARISON:  Head CT without contrast 0945 hours.  FINDINGS: MRI HEAD FINDINGS  Patchy restricted diffusion in the left ACA territory affecting the cingulate gyrus and anterior left frontal lobe and subcortical white matter. Similar cortically based patchy restricted diffusion then in the posterior most left MCA/left PCA watershed territory (series 3, image 23). Mild associated T2 and FLAIR hyperintensity in both areas with mild gyral edema. No significant mass effect. No evidence of associated hemorrhage at this time.  Major intracranial vascular flow voids are preserved, see MRA findings below.  No contralateral or posterior fossa restricted diffusion. No ventriculomegaly. No midline shift, mass effect, or evidence of intracranial mass lesion. No acute intracranial hemorrhage identified. Negative pituitary and cervicomedullary junction. Visible cervical spine remarkable for previous ACDF. Outside the areas of acute signal abnormality there is only minimal to mild for age scattered small cerebral white matter T2 and FLAIR  hyperintensity. No cortical encephalomalacia identified. Negative deep gray matter nuclei, brainstem and cerebellum.  Visible internal auditory structures appear normal. Visualized paranasal sinuses and mastoids are clear. Visualized orbit soft tissues are within normal limits. Visualized scalp soft tissues are within normal limits. Normal bone marrow signal.  MRA HEAD FINDINGS  Antegrade flow in the posterior circulation with dominant distal left vertebral artery. The distal right vertebral artery functionally terminates in PICA. Normal left PICA origin. No basilar  stenosis. SCA and right PCA origins are normal. Fetal type left PCA origin.  No left PCA branch occlusion is identified.  Antegrade flow in both ICA siphons, the left is dominant and mildly dolichoectatic. No siphon stenosis. Ophthalmic and left posterior communicating artery origins are within normal limits. Right posterior communicating artery is diminutive or absent.  At the carotid termini the right ACA A1 segment is hypoplastic or absent. Normal MCA origins. Visualized right MCA branches are within normal limits.  Normal left ACA origin. Normal anterior communicating artery. Visible right ACA branches are within normal limits, but the left ACA is occluded in the A2 segment (series 403, image 6).  The left MCA M1 segment and bifurcation appear normal. Visualized left MCA branches are within normal limits.  IMPRESSION: 1. Acute infarcts in the left ACA, and left posterior MCA/PCA watershed area. Mild edema with no hemorrhage or mass effect. 2. Left ACA occlusion in the A2 segment. 3. Fetal type left PCA origin congruent with the above infarct pattern, but no left MCA or PCA branch occlusion identified. 4. Underlying otherwise fairly normal for age MRI appearance of the brain. Hypoplastic/absent right ACA A1 segment. Study discussed by telephone with Neurology Dr. Noel Christmas on 11/27/2014 at 1416 hours.   Electronically Signed   By: Odessa Fleming M.D.   On: 11/27/2014 14:23   12-lead ECG sinus rhythm Telemetry sinus rhythm, no afib  Assessment and Plan 1. Cryptogenic stroke  If the TEE is negative, we recommend loop recorder insertion to monitor for AF. The indication for loop recorder insertion / monitoring for AF in setting of cryptogenic stroke was discussed with the patient. The loop recorder insertion procedure was reviewed in detail including risks and benefits. These risks include but are not limited to bleeding and infection. The patient expressed verbal understanding and agrees to proceed. The  patient was also counseled regarding wound care and device follow-up.  Randolm Idol 11/29/2014, 9:53 AM    Addendum: TEE reviewed PFO but no LA thrombus or other source for emboli. BLE dopplers performed yesterday revealed no DVT.  Proceed with ILR.

## 2014-11-29 NOTE — Progress Notes (Signed)
  Echocardiogram Echocardiogram Transesophageal has been performed.  [name], Jesse Scollard 11/29/2014, 11:23 AM

## 2014-11-29 NOTE — Progress Notes (Signed)
STROKE TEAM PROGRESS NOTE   SUBJECTIVE (INTERVAL HISTORY) His wife is at the bedside.  Had TEE done and put in loop recorder. Pt is ready to leave.   OBJECTIVE Temp:  [98.2 F (36.8 C)-98.8 F (37.1 C)] 98.3 F (36.8 C) (03/29 1415) Pulse Rate:  [78-97] 84 (03/29 1415) Cardiac Rhythm:  [-] Normal sinus rhythm (03/29 1100) Resp:  [8-20] 18 (03/29 1415) BP: (138-177)/(94-136) 142/94 mmHg (03/29 1415) SpO2:  [94 %-99 %] 97 % (03/29 1415)  No results for input(s): GLUCAP in the last 168 hours.  Recent Labs Lab 11/27/14 0845  NA 140  K 4.2  CL 101  CO2 30  GLUCOSE 110*  BUN 18  CREATININE 0.88  CALCIUM 9.5   No results for input(s): AST, ALT, ALKPHOS, BILITOT, PROT, ALBUMIN in the last 168 hours.  Recent Labs Lab 11/27/14 0845  WBC 5.1  HGB 15.0  HCT 42.2  MCV 91.5  PLT 180    Recent Labs Lab 11/27/14 1443  TROPONINI <0.03   No results for input(s): LABPROT, INR in the last 72 hours.  Recent Labs  11/27/14 2030  COLORURINE YELLOW  LABSPEC 1.019  PHURINE 7.0  GLUCOSEU NEGATIVE  HGBUR NEGATIVE  BILIRUBINUR NEGATIVE  KETONESUR NEGATIVE  PROTEINUR NEGATIVE  UROBILINOGEN 0.2  NITRITE NEGATIVE  LEUKOCYTESUR NEGATIVE       Component Value Date/Time   CHOL 214* 11/28/2014 0535   TRIG 211* 11/28/2014 0535   HDL 37* 11/28/2014 0535   CHOLHDL 5.8 11/28/2014 0535   VLDL 42* 11/28/2014 0535   LDLCALC 135* 11/28/2014 0535   Lab Results  Component Value Date   HGBA1C 5.5 11/28/2014      Component Value Date/Time   LABOPIA NONE DETECTED 11/29/2014 0005   COCAINSCRNUR NONE DETECTED 11/29/2014 0005   LABBENZ NONE DETECTED 11/29/2014 0005   AMPHETMU NONE DETECTED 11/29/2014 0005   THCU NONE DETECTED 11/29/2014 0005   LABBARB NONE DETECTED 11/29/2014 0005     Recent Labs Lab 11/27/14 0854  ETH <5    I have personally reviewed the radiological images below and agree with the radiology interpretations.  Dg Chest 2 View  11/27/2014   IMPRESSION: No  active cardiopulmonary disease.    Ct Head Wo Contrast  11/27/2014    IMPRESSION: Study within normal limits.     Mri and Mra Brain Wo Contrast  11/27/2014   IMPRESSION: 1. Acute infarcts in the left ACA, and left posterior MCA/PCA watershed area. Mild edema with no hemorrhage or mass effect. 2. Left ACA occlusion in the A2 segment. 3. Fetal type left PCA origin congruent with the above infarct pattern, but no left MCA or PCA branch occlusion identified. 4. Underlying otherwise fairly normal for age MRI appearance of the brain. Hypoplastic/absent right ACA A1 segment.   Carotid Doppler  Bilateral: 1-39% ICA stenosis. Vertebral artery flow is antegrade.  LE venous doppler - There is no DVT or SVT noted in the bilateral lower extremities.  2D Echocardiogram  - Left ventricle: The cavity size was normal. Wall thickness was normal. Systolic function was normal. The estimated ejection fraction was in the range of 55% to 60%. Wall motion was normal; there were no regional wall motion abnormalities. Doppler parameters are consistent with abnormal left ventricular relaxation (grade 1 diastolic dysfunction). Impressions - Normal LV function; grade 1 diastolic dysfunction; trace TR.  TEE - No thrombus, no mass Positive bubble study with atrial septal aneurysm consistent with PFO (patent foramen ovale). Mild MR/TR Normal EF  EKG  normal sinus rhythm. For complete results please see formal report.  PHYSICAL EXAM  Temp:  [98.2 F (36.8 C)-98.8 F (37.1 C)] 98.3 F (36.8 C) (03/29 1415) Pulse Rate:  [78-97] 84 (03/29 1415) Resp:  [8-20] 18 (03/29 1415) BP: (138-177)/(94-136) 142/94 mmHg (03/29 1415) SpO2:  [94 %-99 %] 97 % (03/29 1415)  General - Well nourished, well developed, in no apparent distress.  Ophthalmologic - Sharp disc margins OU.  Cardiovascular - Regular rate and rhythm with no murmur.  Neck - supple, no carotid bruits  Mental Status -  Level of arousal and  orientation to time, place, and person were intact. Language including expression, naming, repetition, comprehension was assessed and found intact. Attention span and concentration were normal, however, not able to calculate. Fund of Knowledge was assessed and was intact.  Cranial Nerves II - XII - II - Visual field intact OU. III, IV, VI - Extraocular movements intact. V - Facial sensation intact bilaterally. VII - Facial movement intact bilaterally. VIII - Hearing & vestibular intact bilaterally. X - Palate elevates symmetrically. XI - Chin turning & shoulder shrug intact bilaterally. XII - Tongue protrusion intact.  Motor Strength - The patient's strength was normal in all extremities and pronator drift was absent.  Bulk was normal and fasciculations were absent.   Motor Tone - Muscle tone was assessed at the neck and appendages and was normal.  Reflexes - The patient's reflexes were symmetrical in all extremities and he had no pathological reflexes.  Sensory - Light touch, temperature/pinprick were assessed and were symmetrical    Coordination - The patient had normal movements in the hands and feet with no ataxia or dysmetria.  Tremor was absent.  Gait and Station - The patient's transfers, posture, gait, station, and turns were observed as normal.   ASSESSMENT/PLAN [name] is a 54 y.o. male with history of hypertension, hyperlipidemia and depression admitted for AMS and right upper extremity weakness. Symptoms resolved.    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  Carotid Doppler  unremarkable  2D Echo  Unremarkable  LE venous Doppler negative for DVT  TEE showed PFO and ASA  Loop recorder placed.  LDL 135, not at goal  HgbA1c pending  Hypercoagulable workup pending  Lovenox for VTE prophylaxis  Diet Heart Room service  appropriate?: Yes; Fluid consistency:: Thin   aspirin 81 mg orally every day prior to admission, now on aspirin 325 mg orally every day  Patient counseled to be compliant with his antithrombotic medications  Ongoing aggressive stroke risk factor management  Therapy recommendations:  Outpatient OT  Disposition:  Home  Hypertension  Home meds:   Lisinopril  Permissive hypertension (OK if <220/120) for 24-48 hours post stroke and then gradually normalized within 5-7 days.  Currently on no BP meds  Stable  Patient counseled to be compliant with his blood pressure medications  Hyperlipidemia  Home meds:  None   LDL 136, goal < 70  Add Lipitor 80  Continue statin at discharge  Other Stroke Risk Factors  Family hx stroke (mother)  Other Active Problems  High triglycerides   Other Pertinent History  History of elevated PSA  Hospital day # 2   Neurology will sign off. Please call with questions. [facility]  Thanks for the consult.   Marvel Plan, MD PhD Stroke Neurology 11/29/2014 3:22 PM    To contact Stroke Continuity provider, please refer to [email or web address]. After hours, contact General Neurology

## 2014-11-29 NOTE — Progress Notes (Signed)
Pt transported off unit to procedure for his TEE. Arabella MerlesP. Amo Izyan Ezzell RN.

## 2014-11-29 NOTE — Care Management Note (Signed)
    Page 1 of 1   11/29/2014     2:57:43 PM CARE MANAGEMENT NOTE 11/29/2014  Patient:  Jesse OpitzCIAMILLO,Jesse   Account Number:  [id number]  Date Initiated:  11/29/2014  Documentation initiated by:  Donn PieriniWEBSTER,Julitza Rickles  Subjective/Objective Assessment:   Pt admaitted with syncope- MRI positive for CVA     Action/Plan:   PTA pt lived at home with spouse-PT/OT evals recommending outpt Neuro rehab   Anticipated DC Date:  11/29/2014   Anticipated DC Plan:  HOME/SELF CARE      DC Planning Services  OP Neuro Rehab  CM consult      Choice offered to / List presented to:             Status of service:  Completed, signed off Medicare Important Message given?   (If response is "NO", the following Medicare IM given date fields will be blank) Date Medicare IM given:   Medicare IM given by:   Date Additional Medicare IM given:   Additional Medicare IM given by:    Discharge Disposition:  HOME/SELF CARE  Per UR Regulation:  Reviewed for med. necessity/level of care/duration of stay  If discussed at Long Length of Stay Meetings, dates discussed:    Comments:  11/29/14- 1200- Donn PieriniKristi Dossie Ocanas RN, BSN [phone] Spoke with pt at bedside regarding PT/OT recommendations for outpt neuro rehab- pt would like referral to outpt rehab- info for location and phone # given to pt for [facility]- MD gave verbal order for Neuro rehab referral- [facility] they will f/u with pt regarding outpt PT/OT.

## 2014-11-29 NOTE — Discharge Summary (Signed)
Physician Discharge Summary  Jesse Pacheco ZOX:[id number] DOB: 26-Jun-1961 DOA: 11/27/2014  PCP: Kristian Covey, MD  Admit date: 11/27/2014 Discharge date: 11/29/2014  Time spent: 45 minutes  Recommendations for Outpatient Follow-up:  Patient will be discharged home.  He will need outpatient physical and occupational therapy. Patient should follow-up with his primary care physician within one week of discharge. Patient will also need a follow-up with Dr.Xu, neurologist, within 1 month of discharge. Patient should continue his medications as prescribed. Patient should follow a heart healthy diet.  Discharge Diagnoses:  Acute CVA Hypertension Hyperlipidemia  Discharge Condition: Stable  Diet recommendation: Heart healthy  Filed Weights   11/27/14 0831  Weight: 90.719 kg (200 lb)    History of present illness:  on 11/27/2014 by Ms. Algis Downs with Dr. Theda Belfast Dhungel Jesse Pacheco is a 54 y.o. male, with a history of HTN, HLD and elevated PSA who presented to the ER today from home with stroke like symptoms. Jesse Pacheco reports that he has been feeling well. He got up this morning at approximately 6:30 am and went to the kitchen to get a glass of apple juice. He was walking back to the bedroom and notices that his arms and legs felt shaky. He sat down hard on the floor. His significant other heard a thud and came to check on him. He was unable to move his right leg or arm to stand. He had no bowel or urinary incontinence. She states that he was coherent but slow to respond to her questions, so she called 911. Afterward Jesse Pacheco states he doesn't remember much. EMS reports he was unable to move his right leg as well. In the ER his CT head is negative and labs look good. Neurology has seen him and felt he should be admitted for a stroke work up. He appeared to have a residual right sided visual field defect. Thrombolytics were considered but decided against as his  symptoms were too mild and seem to be resolving.  Hospital Course:  Acute left ACA/posterior MCA/PCA infarct -CT of the head negative -MRI/MRA of brain: Acute infarcts in the left ACA, left posterior MCA/PCA, left ACA occlusion in the A2 segment -Carotid Doppler: 1-39% ICA stenosis, vertebral artery flow is antegrade -Lower extremity Doppler: No DVT or SVT in the bilateral lower extremities -Echocardiogram: EF 55-60%, grade 1 diastolic dysfunction, trace TR -Hemoglobin A1c 5.5 -LDL 135 -PT and OT consulted and recommended outpatient neuro clinic -Continue aspirin, placed on statin -Patient had TEE which showed PFO and then received Loop recorder -Will need to follow up with Dr. Roda Shutters in 1 month.  Hypertension -Lisinopril and chlorthalidone currently held, allowing for permissive hypertension -May restart home medications upon discharge  Hyperlipidemia -Lipid panel: Total cholesterol 214, TG 211, HDL 37, LDL 135 -Continue statin  Procedures  -Carotid Doppler: 1-39% ICA stenosis, vertebral artery flow is antegrade -Lower extremity Doppler: No DVT or SVT in the bilateral lower extremities -Echocardiogram: EF 55-60%, grade 1 diastolic dysfunction, trace TR -Transesophageal echo: No thrombus or mass, positive bubble study with atrial septal aneurysm consistent with PFO. -Loop recorder placed  Consults  Neurology Cardiology  Discharge Exam: Filed Vitals:   11/29/14 1107  BP: 154/101  Pulse: 80  Temp:   Resp: 18   Exam  General: Well developed, well nourished, NAD  HEENT: NCAT, mucous membranes moist.   Cardiovascular: S1 S2 auscultated, RRR  Respiratory: Clear to auscultation bilaterally with equal chest rise  Extremities: warm dry without cyanosis clubbing or edema  Neuro: AAOx3, nonfocal  Psych: Appropriate mood and affect  Discharge Instructions      Discharge Instructions    Discharge instructions    Complete by:  As directed   Patient will be  discharged home.  He will need outpatient physical and occupational therapy. Patient should follow-up with his primary care physician within one week of discharge. Patient will also need a follow-up with Dr.Xu, neurologist, within 1 month of discharge. Patient should continue his medications as prescribed. Patient should follow a heart healthy diet.            Medication List    TAKE these medications        aspirin 81 MG tablet  Take 81 mg by mouth daily.     atorvastatin 80 MG tablet  Commonly known as:  LIPITOR  Take 1 tablet (80 mg total) by mouth daily at 6 PM.     chlorthalidone 25 MG tablet  Commonly known as:  HYGROTON  TAKE 1 TABLET BY MOUTH EVERY DAY *NEEDS OFFICE VISIT *INS. WILL ONLY PAY 30 DAYS     gabapentin 100 MG capsule  Commonly known as:  NEURONTIN  Take 2 capsules (200 mg total) by mouth at bedtime.     ibuprofen 200 MG tablet  Commonly known as:  ADVIL,MOTRIN  Take 200 mg by mouth every 6 (six) hours as needed for mild pain or moderate pain.     lisinopril 40 MG tablet  Commonly known as:  PRINIVIL,ZESTRIL  TAKE 1 TABLET BY MOUTH EVERY DAY *NEEDS OFFICE VISIT *INS. WILL ONLY PAY 30 DAYS     multivitamin with minerals Tabs tablet  Take 1 tablet by mouth daily.       No Known Allergies Follow-up Information    [facility]   Specialty:  Rehabilitation   Why:  referral made for outpt PT/OT- they will call you to f/u on referral   Contact information:   [address] [id number] [place] [id number] [phone]      Follow up with Kristian CoveyBURCHETTE,BRUCE W, MD. Schedule an appointment as soon as possible for a visit in 1 week.   Specialty:  Family Medicine   Why:  One week follow up, hospital follow up   Contact information:   [address] [facility] [address] [place] [address] [phone]       Follow up with Xu,Jindong, MD. Schedule an appointment as soon as possible for a  visit in 1 week.   Specialty:  Neurology   Why:  Hospital follow, Stroke clinic   Contact information:   [address] [place] [id number] [phone]        The results of significant diagnostics from this hospitalization (including imaging, microbiology, ancillary and laboratory) are listed below for reference.    Significant Diagnostic Studies: Dg Chest 2 View  11/27/2014   CLINICAL DATA:  Stroke, weakness  EXAM: CHEST  2 VIEW  COMPARISON:  10/27/2011  FINDINGS: Cardiomediastinal silhouette is unremarkable. No acute infiltrate or pleural effusion. No pulmonary edema. Bony thorax is unremarkable.  IMPRESSION: No active cardiopulmonary disease.   Electronically Signed   By: Natasha MeadLiviu  Pop M.D.   On: 11/27/2014 12:40   Dg Cervical Spine Complete  11/14/2014   CLINICAL DATA:  LEFT shoulder pain for 2 months, prior neck fusion in April 2015  EXAM: CERVICAL SPINE  4+ VIEWS  COMPARISON:  12/15/2013  FINDINGS: Prevertebral soft tissues normal thickness.  Osseous mineralization grossly  normal.  Prior anterior fusion of C C3-C6 with intact hardware and stable disc prostheses at each level.  Mild motion artifacts degrade RPO view.  Narrowing of neural foramina on RIGHT at C3-C4, C4-C5 and C5-C6 as well as LEFT C6-C7 by uncovertebral spurs.  No acute fracture, subluxation or bone destruction.  Lung apices clear.  IMPRESSION: Prior anterior fusion C3-C6.  Scattered narrowing of neural foramina by uncovertebral spurs.  No acute abnormalities.   Electronically Signed   By: Ulyses Southward M.D.   On: 11/14/2014 09:12   Ct Head Wo Contrast  11/27/2014   CLINICAL DATA:  Episode of confusion earlier today  EXAM: CT HEAD WITHOUT CONTRAST  TECHNIQUE: Contiguous axial images were obtained from the base of the skull through the vertex without intravenous contrast.  COMPARISON:  July 19, 2008  FINDINGS: The ventricles are normal in size and configuration. There is no mass, hemorrhage, extra-axial  fluid collection, or midline shift. Gray-white compartments are normal. No acute infarct apparent. Bony calvarium appears intact. The mastoid air cells are clear.  IMPRESSION: Study within normal limits.   Electronically Signed   By: Bretta Bang III M.D.   On: 11/27/2014 09:57   Mr Brain Wo Contrast  11/27/2014   CLINICAL DATA:  54 year old male last seen normal at 0645 hours. Acute altered mental status and right side weakness. TIA versus stroke. Initial encounter.  EXAM: MRI HEAD WITHOUT CONTRAST  MRA HEAD WITHOUT CONTRAST  TECHNIQUE: Multiplanar, multiecho pulse sequences of the brain and surrounding structures were obtained without intravenous contrast. Angiographic images of the head were obtained using MRA technique without contrast.  COMPARISON:  Head CT without contrast 0945 hours.  FINDINGS: MRI HEAD FINDINGS  Patchy restricted diffusion in the left ACA territory affecting the cingulate gyrus and anterior left frontal lobe and subcortical white matter. Similar cortically based patchy restricted diffusion then in the posterior most left MCA/left PCA watershed territory (series 3, image 23). Mild associated T2 and FLAIR hyperintensity in both areas with mild gyral edema. No significant mass effect. No evidence of associated hemorrhage at this time.  Major intracranial vascular flow voids are preserved, see MRA findings below.  No contralateral or posterior fossa restricted diffusion. No ventriculomegaly. No midline shift, mass effect, or evidence of intracranial mass lesion. No acute intracranial hemorrhage identified. Negative pituitary and cervicomedullary junction. Visible cervical spine remarkable for previous ACDF. Outside the areas of acute signal abnormality there is only minimal to mild for age scattered small cerebral white matter T2 and FLAIR hyperintensity. No cortical encephalomalacia identified. Negative deep gray matter nuclei, brainstem and cerebellum.  Visible internal auditory  structures appear normal. Visualized paranasal sinuses and mastoids are clear. Visualized orbit soft tissues are within normal limits. Visualized scalp soft tissues are within normal limits. Normal bone marrow signal.  MRA HEAD FINDINGS  Antegrade flow in the posterior circulation with dominant distal left vertebral artery. The distal right vertebral artery functionally terminates in PICA. Normal left PICA origin. No basilar stenosis. SCA and right PCA origins are normal. Fetal type left PCA origin.  No left PCA branch occlusion is identified.  Antegrade flow in both ICA siphons, the left is dominant and mildly dolichoectatic. No siphon stenosis. Ophthalmic and left posterior communicating artery origins are within normal limits. Right posterior communicating artery is diminutive or absent.  At the carotid termini the right ACA A1 segment is hypoplastic or absent. Normal MCA origins. Visualized right MCA branches are within normal limits.  Normal left ACA origin. Normal  anterior communicating artery. Visible right ACA branches are within normal limits, but the left ACA is occluded in the A2 segment (series 403, image 6).  The left MCA M1 segment and bifurcation appear normal. Visualized left MCA branches are within normal limits.  IMPRESSION: 1. Acute infarcts in the left ACA, and left posterior MCA/PCA watershed area. Mild edema with no hemorrhage or mass effect. 2. Left ACA occlusion in the A2 segment. 3. Fetal type left PCA origin congruent with the above infarct pattern, but no left MCA or PCA branch occlusion identified. 4. Underlying otherwise fairly normal for age MRI appearance of the brain. Hypoplastic/absent right ACA A1 segment. Study discussed by telephone with Neurology Dr. Noel Christmas on 11/27/2014 at 1416 hours.   Electronically Signed   By: Odessa Fleming M.D.   On: 11/27/2014 14:23   Dg Shoulder Left  11/14/2014   CLINICAL DATA:  54 year old male with left shoulder pain for 2 months. No known injury.  Prior cervical spine surgery in 2015. Initial encounter.  EXAM: LEFT SHOULDER - 2+ VIEW  COMPARISON:  None.  FINDINGS: No glenohumeral joint dislocation. Bone mineralization is within normal limits. Proximal left humerus appears intact. Left clavicle and scapula appear intact. Visualized lung parenchyma within normal limits. Evidence of chronic/healed left posterior lateral fifth through seventh rib fractures. Partially visible cervical ACDF hardware.  IMPRESSION: No acute osseous abnormality identified about the left shoulder.   Electronically Signed   By: Odessa Fleming M.D.   On: 11/14/2014 09:30   Mr Maxine Glenn Head/brain Wo Cm  11/27/2014   CLINICAL DATA:  54 year old male last seen normal at 0645 hours. Acute altered mental status and right side weakness. TIA versus stroke. Initial encounter.  EXAM: MRI HEAD WITHOUT CONTRAST  MRA HEAD WITHOUT CONTRAST  TECHNIQUE: Multiplanar, multiecho pulse sequences of the brain and surrounding structures were obtained without intravenous contrast. Angiographic images of the head were obtained using MRA technique without contrast.  COMPARISON:  Head CT without contrast 0945 hours.  FINDINGS: MRI HEAD FINDINGS  Patchy restricted diffusion in the left ACA territory affecting the cingulate gyrus and anterior left frontal lobe and subcortical white matter. Similar cortically based patchy restricted diffusion then in the posterior most left MCA/left PCA watershed territory (series 3, image 23). Mild associated T2 and FLAIR hyperintensity in both areas with mild gyral edema. No significant mass effect. No evidence of associated hemorrhage at this time.  Major intracranial vascular flow voids are preserved, see MRA findings below.  No contralateral or posterior fossa restricted diffusion. No ventriculomegaly. No midline shift, mass effect, or evidence of intracranial mass lesion. No acute intracranial hemorrhage identified. Negative pituitary and cervicomedullary junction. Visible cervical  spine remarkable for previous ACDF. Outside the areas of acute signal abnormality there is only minimal to mild for age scattered small cerebral white matter T2 and FLAIR hyperintensity. No cortical encephalomalacia identified. Negative deep gray matter nuclei, brainstem and cerebellum.  Visible internal auditory structures appear normal. Visualized paranasal sinuses and mastoids are clear. Visualized orbit soft tissues are within normal limits. Visualized scalp soft tissues are within normal limits. Normal bone marrow signal.  MRA HEAD FINDINGS  Antegrade flow in the posterior circulation with dominant distal left vertebral artery. The distal right vertebral artery functionally terminates in PICA. Normal left PICA origin. No basilar stenosis. SCA and right PCA origins are normal. Fetal type left PCA origin.  No left PCA branch occlusion is identified.  Antegrade flow in both ICA siphons, the left is dominant and  mildly dolichoectatic. No siphon stenosis. Ophthalmic and left posterior communicating artery origins are within normal limits. Right posterior communicating artery is diminutive or absent.  At the carotid termini the right ACA A1 segment is hypoplastic or absent. Normal MCA origins. Visualized right MCA branches are within normal limits.  Normal left ACA origin. Normal anterior communicating artery. Visible right ACA branches are within normal limits, but the left ACA is occluded in the A2 segment (series 403, image 6).  The left MCA M1 segment and bifurcation appear normal. Visualized left MCA branches are within normal limits.  IMPRESSION: 1. Acute infarcts in the left ACA, and left posterior MCA/PCA watershed area. Mild edema with no hemorrhage or mass effect. 2. Left ACA occlusion in the A2 segment. 3. Fetal type left PCA origin congruent with the above infarct pattern, but no left MCA or PCA branch occlusion identified. 4. Underlying otherwise fairly normal for age MRI appearance of the brain.  Hypoplastic/absent right ACA A1 segment. Study discussed by telephone with Neurology Dr. Noel Christmas on 11/27/2014 at 1416 hours.   Electronically Signed   By: Odessa Fleming M.D.   On: 11/27/2014 14:23    Microbiology: No results found for this or any previous visit (from the past 240 hour(s)).   Labs: Basic Metabolic Panel:  Recent Labs Lab 11/27/14 0845  NA 140  K 4.2  CL 101  CO2 30  GLUCOSE 110*  BUN 18  CREATININE 0.88  CALCIUM 9.5   Liver Function Tests: No results for input(s): AST, ALT, ALKPHOS, BILITOT, PROT, ALBUMIN in the last 168 hours. No results for input(s): LIPASE, AMYLASE in the last 168 hours. No results for input(s): AMMONIA in the last 168 hours. CBC:  Recent Labs Lab 11/27/14 0845  WBC 5.1  HGB 15.0  HCT 42.2  MCV 91.5  PLT 180   Cardiac Enzymes:  Recent Labs Lab 11/27/14 1443  TROPONINI <0.03   BNP: BNP (last 3 results) No results for input(s): BNP in the last 8760 hours.  ProBNP (last 3 results) No results for input(s): PROBNP in the last 8760 hours.  CBG: No results for input(s): GLUCAP in the last 168 hours.     SignedEdsel Petrin  [facility] 11/29/2014, 11:54 AM

## 2014-11-29 NOTE — Progress Notes (Signed)
Pt discharge education and instructions completed with pt and spouse at bedside. All voices understanding and denies any questions. Pt IV and telemetry removed; pt discharge home with wife to transport him home. Pt to pick up electronic prescription from preferred pharmacy on file. Pt provided handout education on stroke and Loop recorder video watched by both pt and spouse in room. All voices understanding and denies any questions. Pt vitals rechecked prior to discharge after administering ordered home medications. Pt ambulated off unit with belongings and spouse at side. Arabella MerlesP. Amo Damiano Stamper RN.

## 2014-11-29 NOTE — H&P (View-Only) (Signed)
ELECTROPHYSIOLOGY CONSULT NOTE  Patient ID: Jesse Pacheco MRN: [id number], DOB/AGE: June 30, 1961   Admit date: 11/27/2014 Date of Consult: 11/29/2014  Primary Physician: Kristian CoveyBURCHETTE,BRUCE W, MD  Reason for Consultation: Cryptogenic stroke; recommendations regarding Implantable Loop Recorder  History of Present Illness Jesse Opitzhomas Egolf was admitted on 11/27/2014 with acute CVA. he has been monitored on telemetry which has demonstrated no arrhythmias. No cause has been identified. Inpatient stroke work-up is to be completed with a TEE. EP has been asked to evaluate for placement of an implantable loop recorder to monitor for atrial fibrillation.  Past Medical History Past Medical History  Diagnosis Date  . HYPERTENSION 01/09/2009  . VIRAL URI 03/22/2010  . 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 Nocturia 01/09/2009  . Depression     Past Surgical History Past Surgical History  Procedure Laterality Date  . Appendectomy    . Spine surgery  2006, 2009    laminectomy    Allergies/Intolerances No Known Allergies Inpatient Medications . aspirin EC  325 mg Oral Daily  . atorvastatin  80 mg Oral q1800  . enoxaparin (LOVENOX) injection  40 mg Subcutaneous Q24H   . sodium chloride 20 mL/hr at 11/28/14 2000   Social History History   Social History  . Marital Status: Single    Spouse Name: N/A  . Number of Children: N/A  . Years of Education: N/A   Occupational History  . Not on file.   Social History Main Topics  . Smoking status: Never Smoker   . Smokeless tobacco: Not on file  . Alcohol Use: Yes     Comment: drinks 3-4 times a week  . Drug Use: No  . Sexual Activity: Not on file   Other Topics Concern  . Not on file   Social History Narrative    Review of Systems General: No chills, fever, night sweats or weight changes  Cardiovascular:  No chest pain, dyspnea on exertion, edema, orthopnea, palpitations, paroxysmal nocturnal dyspnea Dermatological: No rash, lesions  or masses Respiratory: No cough, dyspnea Urologic: No hematuria, dysuria Abdominal: No nausea, vomiting, diarrhea, bright red blood per rectum, melena, or hematemesis Neurologic: No visual changes, weakness, changes in mental status All other systems reviewed and are otherwise negative except as noted above.  Physical Exam Blood pressure 158/98, pulse 88, temperature 98.2 F (36.8 C), temperature source Oral, resp. rate 20, height 5\' 10"  (1.778 m), weight 200 lb (90.719 kg), SpO2 95 %.  General: Well developed, well appearing 54 y.o. male in no acute distress. HEENT: Normocephalic, atraumatic. EOMs intact. Sclera nonicteric. Oropharynx clear.  Neck: Supple without bruits. No JVD. Lungs: Respirations regular and unlabored, CTA bilaterally. No wheezes, rales or rhonchi. Heart: RRR. S1, S2 present. No murmurs, rub, S3 or S4. Abdomen: Soft, non-tender, non-distended. BS present x 4 quadrants. No hepatosplenomegaly.  Extremities: No clubbing, cyanosis or edema. DP/PT/Radials 2+ and equal bilaterally. Psych: Normal affect. Neuro: Alert and oriented X 3. Moves all extremities spontaneously. Musculoskeletal: No kyphosis. Skin: Intact. Warm and dry. No rashes or petechiae in exposed areas.   Labs Lab Results  Component Value Date   WBC 5.1 11/27/2014   HGB 15.0 11/27/2014   HCT 42.2 11/27/2014   MCV 91.5 11/27/2014   PLT 180 11/27/2014    Recent Labs Lab 11/27/14 0845  NA 140  K 4.2  CL 101  CO2 30  BUN 18  CREATININE 0.88  CALCIUM 9.5  GLUCOSE 110*   No results for input(s): INR in the last 72 hours.  Radiology/Studies Dg Chest 2 View  11/27/2014   CLINICAL DATA:  Stroke, weakness  EXAM: CHEST  2 VIEW  COMPARISON:  10/27/2011  FINDINGS: Cardiomediastinal silhouette is unremarkable. No acute infiltrate or pleural effusion. No pulmonary edema. Bony thorax is unremarkable.  IMPRESSION: No active cardiopulmonary disease.   Electronically Signed   By: Natasha MeadLiviu  Pop M.D.   On:  11/27/2014 12:40   Dg Cervical Spine Complete  11/14/2014   CLINICAL DATA:  LEFT shoulder pain for 2 months, prior neck fusion in April 2015  EXAM: CERVICAL SPINE  4+ VIEWS  COMPARISON:  12/15/2013  FINDINGS: Prevertebral soft tissues normal thickness.  Osseous mineralization grossly normal.  Prior anterior fusion of C C3-C6 with intact hardware and stable disc prostheses at each level.  Mild motion artifacts degrade RPO view.  Narrowing of neural foramina on RIGHT at C3-C4, C4-C5 and C5-C6 as well as LEFT C6-C7 by uncovertebral spurs.  No acute fracture, subluxation or bone destruction.  Lung apices clear.  IMPRESSION: Prior anterior fusion C3-C6.  Scattered narrowing of neural foramina by uncovertebral spurs.  No acute abnormalities.   Electronically Signed   By: Ulyses SouthwardMark  Boles M.D.   On: 11/14/2014 09:12   Ct Head Wo Contrast  11/27/2014   CLINICAL DATA:  Episode of confusion earlier today  EXAM: CT HEAD WITHOUT CONTRAST  TECHNIQUE: Contiguous axial images were obtained from the base of the skull through the vertex without intravenous contrast.  COMPARISON:  July 19, 2008  FINDINGS: The ventricles are normal in size and configuration. There is no mass, hemorrhage, extra-axial fluid collection, or midline shift. Gray-white compartments are normal. No acute infarct apparent. Bony calvarium appears intact. The mastoid air cells are clear.  IMPRESSION: Study within normal limits.   Electronically Signed   By: Bretta BangWilliam  Woodruff III M.D.   On: 11/27/2014 09:57   Mr Brain Wo Contrast  11/27/2014   CLINICAL DATA:  54 year old male last seen normal at 0645 hours. Acute altered mental status and right side weakness. TIA versus stroke. Initial encounter.  EXAM: MRI HEAD WITHOUT CONTRAST  MRA HEAD WITHOUT CONTRAST  TECHNIQUE: Multiplanar, multiecho pulse sequences of the brain and surrounding structures were obtained without intravenous contrast. Angiographic images of the head were obtained using MRA technique  without contrast.  COMPARISON:  Head CT without contrast 0945 hours.  FINDINGS: MRI HEAD FINDINGS  Patchy restricted diffusion in the left ACA territory affecting the cingulate gyrus and anterior left frontal lobe and subcortical white matter. Similar cortically based patchy restricted diffusion then in the posterior most left MCA/left PCA watershed territory (series 3, image 23). Mild associated T2 and FLAIR hyperintensity in both areas with mild gyral edema. No significant mass effect. No evidence of associated hemorrhage at this time.  Major intracranial vascular flow voids are preserved, see MRA findings below.  No contralateral or posterior fossa restricted diffusion. No ventriculomegaly. No midline shift, mass effect, or evidence of intracranial mass lesion. No acute intracranial hemorrhage identified. Negative pituitary and cervicomedullary junction. Visible cervical spine remarkable for previous ACDF. Outside the areas of acute signal abnormality there is only minimal to mild for age scattered small cerebral white matter T2 and FLAIR hyperintensity. No cortical encephalomalacia identified. Negative deep gray matter nuclei, brainstem and cerebellum.  Visible internal auditory structures appear normal. Visualized paranasal sinuses and mastoids are clear. Visualized orbit soft tissues are within normal limits. Visualized scalp soft tissues are within normal limits. Normal bone marrow signal.  MRA HEAD FINDINGS  Antegrade flow in  the posterior circulation with dominant distal left vertebral artery. The distal right vertebral artery functionally terminates in PICA. Normal left PICA origin. No basilar stenosis. SCA and right PCA origins are normal. Fetal type left PCA origin.  No left PCA branch occlusion is identified.  Antegrade flow in both ICA siphons, the left is dominant and mildly dolichoectatic. No siphon stenosis. Ophthalmic and left posterior communicating artery origins are within normal limits. Right  posterior communicating artery is diminutive or absent.  At the carotid termini the right ACA A1 segment is hypoplastic or absent. Normal MCA origins. Visualized right MCA branches are within normal limits.  Normal left ACA origin. Normal anterior communicating artery. Visible right ACA branches are within normal limits, but the left ACA is occluded in the A2 segment (series 403, image 6).  The left MCA M1 segment and bifurcation appear normal. Visualized left MCA branches are within normal limits.  IMPRESSION: 1. Acute infarcts in the left ACA, and left posterior MCA/PCA watershed area. Mild edema with no hemorrhage or mass effect. 2. Left ACA occlusion in the A2 segment. 3. Fetal type left PCA origin congruent with the above infarct pattern, but no left MCA or PCA branch occlusion identified. 4. Underlying otherwise fairly normal for age MRI appearance of the brain. Hypoplastic/absent right ACA A1 segment. Study discussed by telephone with Neurology Dr. Noel Christmas on 11/27/2014 at 1416 hours.   Electronically Signed   By: Odessa Fleming M.D.   On: 11/27/2014 14:23   Dg Shoulder Left  11/14/2014   CLINICAL DATA:  54 year old male with left shoulder pain for 2 months. No known injury. Prior cervical spine surgery in 2015. Initial encounter.  EXAM: LEFT SHOULDER - 2+ VIEW  COMPARISON:  None.  FINDINGS: No glenohumeral joint dislocation. Bone mineralization is within normal limits. Proximal left humerus appears intact. Left clavicle and scapula appear intact. Visualized lung parenchyma within normal limits. Evidence of chronic/healed left posterior lateral fifth through seventh rib fractures. Partially visible cervical ACDF hardware.  IMPRESSION: No acute osseous abnormality identified about the left shoulder.   Electronically Signed   By: Odessa Fleming M.D.   On: 11/14/2014 09:30   Mr Maxine Glenn Head/brain Wo Cm  11/27/2014   CLINICAL DATA:  53 year old male last seen normal at 0645 hours. Acute altered mental status and right  side weakness. TIA versus stroke. Initial encounter.  EXAM: MRI HEAD WITHOUT CONTRAST  MRA HEAD WITHOUT CONTRAST  TECHNIQUE: Multiplanar, multiecho pulse sequences of the brain and surrounding structures were obtained without intravenous contrast. Angiographic images of the head were obtained using MRA technique without contrast.  COMPARISON:  Head CT without contrast 0945 hours.  FINDINGS: MRI HEAD FINDINGS  Patchy restricted diffusion in the left ACA territory affecting the cingulate gyrus and anterior left frontal lobe and subcortical white matter. Similar cortically based patchy restricted diffusion then in the posterior most left MCA/left PCA watershed territory (series 3, image 23). Mild associated T2 and FLAIR hyperintensity in both areas with mild gyral edema. No significant mass effect. No evidence of associated hemorrhage at this time.  Major intracranial vascular flow voids are preserved, see MRA findings below.  No contralateral or posterior fossa restricted diffusion. No ventriculomegaly. No midline shift, mass effect, or evidence of intracranial mass lesion. No acute intracranial hemorrhage identified. Negative pituitary and cervicomedullary junction. Visible cervical spine remarkable for previous ACDF. Outside the areas of acute signal abnormality there is only minimal to mild for age scattered small cerebral white matter T2 and FLAIR  hyperintensity. No cortical encephalomalacia identified. Negative deep gray matter nuclei, brainstem and cerebellum.  Visible internal auditory structures appear normal. Visualized paranasal sinuses and mastoids are clear. Visualized orbit soft tissues are within normal limits. Visualized scalp soft tissues are within normal limits. Normal bone marrow signal.  MRA HEAD FINDINGS  Antegrade flow in the posterior circulation with dominant distal left vertebral artery. The distal right vertebral artery functionally terminates in PICA. Normal left PICA origin. No basilar  stenosis. SCA and right PCA origins are normal. Fetal type left PCA origin.  No left PCA branch occlusion is identified.  Antegrade flow in both ICA siphons, the left is dominant and mildly dolichoectatic. No siphon stenosis. Ophthalmic and left posterior communicating artery origins are within normal limits. Right posterior communicating artery is diminutive or absent.  At the carotid termini the right ACA A1 segment is hypoplastic or absent. Normal MCA origins. Visualized right MCA branches are within normal limits.  Normal left ACA origin. Normal anterior communicating artery. Visible right ACA branches are within normal limits, but the left ACA is occluded in the A2 segment (series 403, image 6).  The left MCA M1 segment and bifurcation appear normal. Visualized left MCA branches are within normal limits.  IMPRESSION: 1. Acute infarcts in the left ACA, and left posterior MCA/PCA watershed area. Mild edema with no hemorrhage or mass effect. 2. Left ACA occlusion in the A2 segment. 3. Fetal type left PCA origin congruent with the above infarct pattern, but no left MCA or PCA branch occlusion identified. 4. Underlying otherwise fairly normal for age MRI appearance of the brain. Hypoplastic/absent right ACA A1 segment. Study discussed by telephone with Neurology Dr. Noel Christmas on 11/27/2014 at 1416 hours.   Electronically Signed   By: Odessa Fleming M.D.   On: 11/27/2014 14:23   12-lead ECG sinus rhythm Telemetry sinus rhythm, no afib  Assessment and Plan 1. Cryptogenic stroke  If the TEE is negative, we recommend loop recorder insertion to monitor for AF. The indication for loop recorder insertion / monitoring for AF in setting of cryptogenic stroke was discussed with the patient. The loop recorder insertion procedure was reviewed in detail including risks and benefits. These risks include but are not limited to bleeding and infection. The patient expressed verbal understanding and agrees to proceed. The  patient was also counseled regarding wound care and device follow-up.  Randolm Idol 11/29/2014, 9:53 AM

## 2014-11-29 NOTE — Progress Notes (Signed)
Pt back from procedure; A&Ox4, denies any pain or discomfort; VSS; dry dsg clean and intact to left chest, no stain or drainage noted. Will continue to monitor. Dionne BucyP. Amo Sherlin Sonier RN

## 2014-11-30 ENCOUNTER — Encounter (HOSPITAL_COMMUNITY): Payer: Self-pay | Admitting: Internal Medicine

## 2014-11-30 LAB — PROTHROMBIN GENE MUTATION

## 2014-11-30 LAB — PROTEIN C ACTIVITY: PROTEIN C ACTIVITY: 135 % (ref 74–151)

## 2014-11-30 LAB — LUPUS ANTICOAGULANT PANEL
DRVVT: 34.9 s (ref 0.0–55.1)
PTT LA: 37.3 s (ref 0.0–50.0)

## 2014-11-30 LAB — CARDIOLIPIN ANTIBODIES, IGG, IGM, IGA
Anticardiolipin IgA: <9 APL U/mL (ref 0–11)
Anticardiolipin IgG: <9 GPL U/mL (ref 0–14)
Anticardiolipin IgM: <9 MPL U/mL (ref 0–12)

## 2014-11-30 LAB — PROTEIN S ACTIVITY: Protein S Activity: 111 % (ref 60–145)

## 2014-11-30 LAB — PROTEIN S, TOTAL: Protein S Ag, Total: 141 % (ref 58–150)

## 2014-11-30 LAB — PROTEIN C, TOTAL: PROTEIN C, TOTAL: 126 % (ref 70–140)

## 2014-12-02 ENCOUNTER — Ambulatory Visit: Payer: 59 | Admitting: Occupational Therapy

## 2014-12-02 ENCOUNTER — Ambulatory Visit: Payer: 59 | Attending: Internal Medicine

## 2014-12-02 ENCOUNTER — Other Ambulatory Visit: Payer: Self-pay | Admitting: Neurology

## 2014-12-02 DIAGNOSIS — R6889 Other general symptoms and signs: Secondary | ICD-10-CM

## 2014-12-02 DIAGNOSIS — I639 Cerebral infarction, unspecified: Secondary | ICD-10-CM

## 2014-12-02 DIAGNOSIS — R279 Unspecified lack of coordination: Secondary | ICD-10-CM

## 2014-12-02 DIAGNOSIS — R29898 Other symptoms and signs involving the musculoskeletal system: Secondary | ICD-10-CM

## 2014-12-02 DIAGNOSIS — I69319 Unspecified symptoms and signs involving cognitive functions following cerebral infarction: Secondary | ICD-10-CM

## 2014-12-02 DIAGNOSIS — R531 Weakness: Secondary | ICD-10-CM

## 2014-12-02 DIAGNOSIS — Z7409 Other reduced mobility: Secondary | ICD-10-CM | POA: Diagnosis not present

## 2014-12-02 DIAGNOSIS — R278 Other lack of coordination: Secondary | ICD-10-CM

## 2014-12-02 DIAGNOSIS — R262 Difficulty in walking, not elsewhere classified: Secondary | ICD-10-CM | POA: Diagnosis not present

## 2014-12-02 LAB — FACTOR 5 LEIDEN

## 2014-12-02 NOTE — Therapy (Signed)
[facility] [address] [place], [place], [address] Phone: [phone]   Fax:  [phone]  Physical Therapy Evaluation  Patient Details  Name: Jesse Pacheco MRN: [id number] Date of Birth: [birth year] Referring Provider:  Edsel Petrin, DO  Encounter Date: 12/02/2014      PT End of Session - 12/02/14 1621    Visit Number 1   Number of Visits 17   Date for PT Re-Evaluation 01/31/15   Authorization Type [employer]-check visit limit   PT Start Time 0930   PT Stop Time 1015   PT Time Calculation (min) 45 min      Past Medical History  Diagnosis Date  . HYPERTENSION 01/09/2009  . VIRAL URI 03/22/2010  . 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 Nocturia 01/09/2009  . Depression     Past Surgical History  Procedure Laterality Date  . Appendectomy    . Spine surgery  2006, 2009    laminectomy  . Loop recorder implant N/A 11/29/2014    Procedure: LOOP RECORDER IMPLANT;  Surgeon: Hillis Range, MD;  Location: [facility] CATH LAB;  Service: Cardiovascular;  Laterality: N/A;  . Tee without cardioversion N/A 11/29/2014    Procedure: TRANSESOPHAGEAL ECHOCARDIOGRAM (TEE);  Surgeon: Jake Bathe, MD;  Location: [facility] ENDOSCOPY;  Service: Cardiovascular;  Laterality: N/A;    There were no vitals filed for this visit.  Visit Diagnosis:  Difficulty walking down slope - Plan: PT PLAN OF CARE CERT/RE-CERT  Difficulty walking down stairs - Plan: PT PLAN OF CARE CERT/RE-CERT  Decreased activity tolerance - Plan: PT PLAN OF CARE CERT/RE-CERT  Decreased strength, endurance, and mobility - Plan: PT PLAN OF CARE CERT/RE-CERT      Subjective Assessment - 12/02/14 0938    Subjective Pt had a recent CVA on 11/27/14 and was hospitalized from 3/27-3/29 and is presenting ot outpatient PT. He has noticed weakness on the right side, decreased balance, decreased peripheral vision on the right,  and slower walking speed. Pt denies falls.     Pertinent History Hypertension, left shoulder bursitis (x2 months)   Patient Stated Goals to improve balance   Currently in Pain? No/denies            [facility] PT Assessment - 12/02/14 1357    Assessment   Medical Diagnosis stroke   Onset Date 11/27/14   Prior Therapy Just evaluated in the hospital and outpatient PT/OT was recommended   Precautions   Precautions Fall  cardiac loop recorder   Balance Screen   Has the patient fallen in the past 6 months No   Has the patient had a decrease in activity level because of a fear of falling?  No   Is the patient reluctant to leave their home because of a fear of falling?  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   Leisure work   IT consultant   Overall Cognitive Status Impaired/Different  from baseline  difficulty with word finding; possible cognitive deficit   Observation/Other Assessments   Focus on Therapeutic Outcomes (FOTO)  Functional Status 60   Other Surveys  Select   Stroke Impact Scale  56.1%  mobility portion   Posture/Postural Control   Posture/Postural Control No significant limitations   ROM / Strength   AROM / PROM / Strength Strength  4+/5 to 5/5 on RLE; 5/5 on LLE for all groups   Flexibility   Soft Tissue Assessment /Muscle Length --  no significant abnormalities.   Transfers   Transfers --  independent with all   Ambulation/Gait    Ambulation/Gait Yes   Ambulation/Gait Assistance 7: Independent   Ambulation Distance (Feet) 200 Feet  pt reports fatigue with walking long distances at [employer]   Assistive device None   Gait Pattern Within Functional Limits   Ambulation Surface Level;Indoor  would require supervision on uneven grassy surfaces currentl   Gait velocity 4.45  normal walking speed   Stairs Yes   Stairs Assistance 7: Independent  but pt reports feeling uneasy on stairs   Stair Management Technique No rails;Alternating pattern   Number of Stairs 4   Height of Stairs 6   Ramp 5: Supervision  with pt guarded for balance on ramp, very mild unsteadiness   Curb 7: Independent   Balance   Balance Assessed Yes   Standardized Balance Assessment   Standardized Balance Assessment Dynamic Gait Index   Dynamic Gait Index   Level Surface Normal   Change in Gait Speed Normal   Gait with Horizontal Head Turns Normal   Gait with Vertical Head Turns Mild Impairment   Gait and Pivot Turn Mild Impairment   Step Over Obstacle Normal   Step Around Obstacles Normal   Steps Normal   Total Score 22   High Level Balance   High Level Balance Activites --  eyes closed+vertical head turns on foam with moderate sway                             PT Short Term Goals - 12/02/14 1639    PT SHORT TERM GOAL #1   Title Demonstrate correct performance of HEP. Target 12/30/14   PT SHORT TERM GOAL #2   Title Demonstrate and verbalize increased confidence when negotiating stairs and ramp without UE support and without guarded technique for improved community and work site access. Target 12/30/14   PT SHORT TERM GOAL #3   Title Assess ability to perform a timed overhead UE task and write appropriate STG and LTG__________________Target 12/30/14   PT SHORT TERM GOAL #4   Title Assess ability to climb ladder and write appropriate STG and LTG______________Target 12/30/14   PT SHORT TERM GOAL #5   Title Demonstrate  ability to lift a box loaded with 40# overhead with safe technique, independently for progress toward lifting on work site (able to lift loaded box with 30# overhead on eval). Target 12/30/14           PT Long Term Goals - 12/02/14 1643    PT LONG TERM GOAL #1   Title Increase DGI score to 24/24 for improved dynamic balance. Target 01/31/15   PT LONG TERM GOAL #2   Title Demonstrate ability to lift a loaded box with 50# overhead with safe and independent performance and normal vitals. Target 01/31/15   PT LONG TERM GOAL #3   Title Overhead upper extremity timed task  goal:________________________   PT LONG TERM GOAL #4   Title Ambulate on outdoor uneven concrete and grass surfaces while intermittently picking up and carrying 20# and 30# boxes for short distances for progress toward readiness to return to work. Target 01/31/15   PT LONG TERM GOAL #5   Title Activities balance confidence goal (on paper):____________________   Additional Long Term Goals   Additional Long Term Goals Yes   PT LONG TERM GOAL #6   Title Increase SIS mobility score on FOTO by 15% for improved quality of life. Target 01/31/15               Plan - 12/02/14 1622    Clinical Impression Statement Pt is doing quite well as far as general mobility is concerned; however, his balance confidence is impaired--especially on stairs and ramp; and he is hoping to eventually return to work. 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   Pt will benefit from skilled therapeutic intervention in order to improve on the following deficits Other (comment);Decreased coordination;Decreased endurance;Decreased activity  tolerance;Decreased strength;Decreased balance  difficulty walking on uneven surfaces; decreased strength for work related tasks such as lifting over head   Rehab Potential Good   PT Frequency 2x / week   PT Duration 8 weeks   PT Treatment/Interventions Therapeutic exercise;Gait training;Patient/family education;Stair training;Balance training;Neuromuscular re-education;Therapeutic activities   PT Next Visit Plan assess pt's ability to negotiate a ladder, and to perform timed overhead UE task in standing and write appropriate LTG for this; also provide activities of balance confidence test on paper and write appropriate LTG; HEP for eyes closed +head turns on foam; marching on foam, anterior/posterior limits of stability on foam with eyes closed, determine safety of overhead lifting for HEP         Problem List Patient Active Problem List   Diagnosis Date Noted  . Stroke 11/27/2014  . Hyperlipidemia 11/27/2014  . Acute ischemic stroke   . Transient confusion   . 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 Shoulder bursitis 10/24/2014  . Elevated PSA 09/22/2012  . VIRAL URI 03/22/2010  . 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 Essential hypertension 01/09/2009  . NOCTURIA 01/09/2009   Lamar LaundryJennifer Abdulkadir Emmanuel, PT,DPT,NCS 12/02/2014 4:52 PM Phone [phone] FAX [phone]         [facility] [address] [place], [place], [address] Phone: [phone]   Fax:  [phone]

## 2014-12-02 NOTE — Therapy (Signed)
[facility] [address] [place], [place], [address] Phone: [phone]   Fax:  [phone]  Occupational Therapy Evaluation  Patient Details  Name: Jesse Pacheco MRN: [id number] Date of Birth: 1961-04-30 Referring Provider:  Kristian Covey, MD  Encounter Date: 12/02/2014      OT End of Session - 12/02/14 1320    Visit Number 1   Number of Visits 17   Date for OT Re-Evaluation 01/30/15   OT Start Time 1020   OT Stop Time 1058   OT Time Calculation (min) 38 min   Activity Tolerance Patient tolerated treatment well   Behavior During Therapy [facility] for tasks assessed/performed      Past Medical History  Diagnosis Date  . HYPERTENSION 01/09/2009  . VIRAL URI 03/22/2010  . 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 Nocturia 01/09/2009  . Depression     Past Surgical History  Procedure Laterality Date  . Appendectomy    . Spine surgery  2006, 2009    laminectomy  . Loop recorder implant N/A 11/29/2014    Procedure: LOOP RECORDER IMPLANT;  Surgeon: Hillis Range, MD;  Location: [facility] CATH LAB;  Service: Cardiovascular;  Laterality: N/A;  . Tee without cardioversion N/A 11/29/2014    Procedure: TRANSESOPHAGEAL ECHOCARDIOGRAM (TEE);  Surgeon: Jake Bathe, MD;  Location: [facility] ENDOSCOPY;  Service: Cardiovascular;  Laterality: N/A;    There were no vitals filed for this visit.  Visit Diagnosis:  Cognitive deficits following cerebral infarction  Decreased coordination  RUE weakness  Decreased functional activity tolerance      Subjective Assessment - 12/02/14 1319    Symptoms Pt s/p CVA denies pain.   Patient Stated Goals Return to work. Pt is self employed remodeling homes.           [facility] OT Assessment - 12/02/14 1021    Assessment   Diagnosis Pt had CVA on 11/27/14.   Onset Date 11/27/14   Assessment Pt was hospitalized x 3 days   Prior Therapy acute rehab    Precautions   Precautions Fall   Balance  Screen   Has the patient fallen in the past 6 months Yes   How many times? 1   Has the patient had a decrease in activity level because of a fear of falling?  No   Is the patient reluctant to leave their home because of a fear of falling?  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   Leisure work   ADL   ADL comments Pt is performing all basic ADLS at a modified independent level   Mobility   Mobility Status Independent   Written Expression   Dominant Hand Right   Vision - History   Visual History Other (comment)   Patient Visual Report Peripheral vision impairment  pt report when hospitalized   Vision Assessment   Eye Alignment Within Functional Limits  Pt appears to have mild ptosis right eye   Ocular Range of Motion Within Functional Limits   Tracking/Visual Pursuits Able to track stimulus in all quads without difficulty   Visual Fields No apparent deficits  Pt reports he had right visual field deficit, TBA further   Activity Tolerance   Activity Tolerance --  fatigues following 30-40 mins of activity   Activity Tolerance Comments Pt's girlfriend reports he fatigued after 45 min trip to [employer] yesterday   Cognition   Overall Cognitive Status --  Pt reports delayed processing   Mini Mental State Exam  recalls 1/3 words correctly  spells world backwards without difficulty   Attention Selective  difficulty with alteranting/ divided attention   Memory Impaired   Memory Impairment Decreased short term memory;Other (comment)  recalls 1/3 words   Behaviors --  increased difficulty with multi tasking     Sensation   Light Touch Appears Intact   Coordination   Gross Motor Movements are Fluid and Coordinated Yes   Fine Motor Movements are Fluid and Coordinated No   9 Hole Peg Test Right;Left   Right 9 Hole Peg Test  26.84   Left 9 Hole Peg Test 25.66   Coordination mildly impaired for RUE   AROM   Overall AROM  Within functional limits for tasks performed   Strength   Overall Strength Deficits   Overall Strength Comments RUE -decreased proximal right shoulder strength grossly 4/5   distal strength 4+/5 for triceps, biceps 4+/5 LUE WNLS   Hand Function   Right Hand Grip (lbs) 84   Left Hand Grip (lbs) 85     Therapist to further assess environmental scanning/ visual perceptual skills.                       OT Short Term Goals - 12/02/14 1338    OT SHORT TERM GOAL #1   Title I with intial HEP.   Baseline 12/31/14   Time 4   Period Weeks   Status New   OT SHORT TERM GOAL #2   Title Pt will verbalize understanding of compensatory strategies for short term memory deficits.   Time 4   Period Weeks   Status New           OT Long Term Goals - 12/02/14 1340    OT LONG TERM GOAL #1   Title Pt will decrease RUE fine motor 9 hole peg test score to 23 secs or less for increased functional use.   Time 8   Period Weeks   Status New   OT LONG TERM GOAL #2   Title Pt will demonstrate ability to perform a physical and cognitive task simultaneously with 95% accuracy.   Time 8   Period Weeks   Status New   OT LONG TERM GOAL #3   Title Pt will perform environmental scanning in a busy environment with 95 % accuracy in prep for work/ driving.   Time 8   Period Weeks   Status New   OT LONG TERM GOAL #4   Title Pt will increase RUE grip strength by 8 lbs for increased functional use.   Time 8   Period Weeks   Status New               Plan - 12/02/14 1321    Clinical Impression Statement Pt s/p CVA on 11/27/14 presents with mild coordination/ strength  deficits in RUE and cognitive deficits which impedes pt's ability to perform ADLs, IADLS. Pt can benefit from skilled occupational therapy.   Pt will benefit from skilled therapeutic intervention in order to improve on the following deficits (Retired) Decreased coordination;Decreased range of motion;Decreased endurance;Decreased safety  awareness;Decreased activity tolerance;Decreased balance;Impaired UE functional use;Impaired vision/preception;Decreased strength;Decreased cognition   Rehab Potential Good   Clinical Impairments Affecting Rehab Potential see above   OT Frequency 2x / week  plus eval   OT Duration 8 weeks  may d/c after 4-6 weeks dependent upon pt progress   OT Treatment/Interventions Self-care/ADL training;Moist Heat;Fluidtherapy;DME and/or AE instruction;Patient/family education;Therapeutic exercises;Ultrasound;Therapeutic exercise;Therapeutic activities;Cognitive remediation/compensation;Neuromuscular education;Cryotherapy;Energy conservation;Manual Therapy;Visual/perceptual remediation/compensation   Plan Issue inital HEP for RUE strength(theraband? green), assess environmental scanning, coordination activities   Consulted and Agree with Plan of Care Patient;Family member/caregiver        Problem List Patient Active Problem List   Diagnosis Date Noted  . Stroke 11/27/2014  . Hyperlipidemia 11/27/2014  . Acute ischemic stroke   . Transient confusion   . 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 Shoulder bursitis 10/24/2014  . Elevated PSA 09/22/2012  . VIRAL URI 03/22/2010  . 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 Essential hypertension 01/09/2009  . NOCTURIA 01/09/2009    Shandi Godfrey 12/02/2014, 1:46 PM Keene Breath, OTR/L [phone] Phone: [phone] 1:46 PM 12/02/2014 [facility] Out[facility] Rehabilitation [facility] [address] [place], [place], [address] Phone: [phone]   Fax:  [phone]

## 2014-12-05 ENCOUNTER — Telehealth: Payer: Self-pay

## 2014-12-05 NOTE — Telephone Encounter (Signed)
Stacey from CT called and states that the patient decided he did not want to the CT due to having a stroke.  The patient has an upcoming appointment on Wednesday with Dr. Caryl NeverBurchette. Misty StanleyStacey states she has to make the physician aware- no call back needed.

## 2014-12-06 ENCOUNTER — Ambulatory Visit: Payer: 59 | Admitting: Occupational Therapy

## 2014-12-06 ENCOUNTER — Inpatient Hospital Stay: Admission: RE | Admit: 2014-12-06 | Payer: 59 | Source: Ambulatory Visit

## 2014-12-06 DIAGNOSIS — R6889 Other general symptoms and signs: Secondary | ICD-10-CM

## 2014-12-06 DIAGNOSIS — I69319 Unspecified symptoms and signs involving cognitive functions following cerebral infarction: Secondary | ICD-10-CM

## 2014-12-06 DIAGNOSIS — R262 Difficulty in walking, not elsewhere classified: Secondary | ICD-10-CM | POA: Diagnosis not present

## 2014-12-06 DIAGNOSIS — R29898 Other symptoms and signs involving the musculoskeletal system: Secondary | ICD-10-CM

## 2014-12-06 DIAGNOSIS — R279 Unspecified lack of coordination: Secondary | ICD-10-CM

## 2014-12-06 DIAGNOSIS — R278 Other lack of coordination: Secondary | ICD-10-CM

## 2014-12-06 NOTE — Patient Instructions (Signed)
  Strengthening: Resisted Flexion   Hold tubing with _____ arm(s) at side. Pull forward and up. Move shoulder through pain-free range of motion. Repeat __10__ times per set.  Do _1-2_ sessions per day , every other day   Strengthening: Resisted Extension   Hold tubing in _right____ hand(s), arm forward. Pull arm back, elbow straight. Repeat _10___ times per set. Do _1-2___ sessions per day, every other day.   Resisted Horizontal Abduction: Bilateral   Sit or stand, tubing in both hands, arms out in front. Keeping arms straight, pinch shoulder blades together and stretch arms out. Repeat _10___ times per set. Do _1-2___ sessions per day, every other day.   Elbow Flexion: Resisted   With tubing held in ______ hand(s) and other end secured under foot, curl arm up as far as possible. Repeat _10___ times per set. Do _1-2___ sessions per day, every other day.    Elbow Extension: Resisted   Sit in chair with resistive band secured at armrest (or hold with other hand) and __right_____ elbow bent. Straighten elbow. Repeat _10___ times per set.  Do _1-2___ sessions per day, every other day.         1. Grip Strengthening (Resistive Putty)   Squeeze putty using thumb and all fingers. Repeat _20___ times. Do __2__ sessions per day.   2. Roll putty into tube on table and pinch between each finger and thumb x 10 reps each. (can do ring and small finger together)  3. Pull putty apart as if you are pulling taffy, 10 -20 reps    Copyright  VHI. All rights reserved.   Copyright  VHI. All rights reserved.

## 2014-12-07 ENCOUNTER — Encounter: Payer: Self-pay | Admitting: Family Medicine

## 2014-12-07 ENCOUNTER — Ambulatory Visit (INDEPENDENT_AMBULATORY_CARE_PROVIDER_SITE_OTHER): Payer: 59 | Admitting: Family Medicine

## 2014-12-07 VITALS — BP 128/78 | HR 94 | Temp 97.6°F | Wt 187.0 lb

## 2014-12-07 DIAGNOSIS — E785 Hyperlipidemia, unspecified: Secondary | ICD-10-CM | POA: Diagnosis not present

## 2014-12-07 MED ORDER — ATORVASTATIN CALCIUM 80 MG PO TABS: ORAL_TABLET | ORAL | Status: DC

## 2014-12-07 NOTE — Progress Notes (Signed)
Subjective:    Patient ID: Jesse Pacheco, male    DOB: 1960/11/25, 54 y.o.   MRN: [id number]  HPI Patient seen for hospital follow-up. He was admitted on 3-27 and discharged on 3-29. Patient had presented to ED with stroke like symptoms. Day of admission he got up around 6:30 AM and went to his kitchen to drink some juice. He was walking back to the bedroom and had sudden onset of weakness right arm and leg. He basically sat down hard on the floor and his girlfriend heard him fall. He had right leg and arm weakness which was obvious. He was coherent but slow to answer questions. He was taken by EMS to ER and EMS also noticed some right leg weakness. Initial CT head no acute findings and no acute lab abnormalities.  Patient had subsequent MRI and MRA angiogram brain which showed acute infarcts left ACA, left posterior MCA/PCA, and left ACA occlusion in the  A2 segment. Carotid Dopplers no significant ICA stenosis. Lower extremity Dopplers no DVT. Echocardiogram ejection fraction 55-60%. Lipids were elevated with LDL 135. Patient had transesophageal echo showing PFO. Patient received Loop recorder. He was placed on statin with atorvastatin 80 mg once daily. Continued on aspirin.   His usual blood pressure medications (lisinopril and chlorthalidone) were held during hospitalization. He has started these back since discharge and blood pressure has been stable. He's had almost full recovery of his right arm and leg weakness and was felt he did not need additional physical therapy. Still seeing a occupational therapist and speech therapy. He sometimes has difficulties finding the right word. No slurred speech.  Patient was occasionally smoking cigars prior to admission but has given these up. He is compliant with medications including atorvastatin. He has follow-up with neurology in one month.  Past Medical History  Diagnosis Date  . HYPERTENSION 01/09/2009  . VIRAL URI 03/22/2010  . Impotence of organic  origin 01/27/2009  . Nocturia 01/09/2009  . Depression    Past Surgical History  Procedure Laterality Date  . Appendectomy    . Spine surgery  2006, 2009    laminectomy  . Loop recorder implant N/A 11/29/2014    Procedure: LOOP RECORDER IMPLANT;  Surgeon: Jesse RangeJames Allred, MD;  [facility];  Service: Cardiovascular;  Laterality: N/A;  . Tee without cardioversion N/A 11/29/2014    Procedure: TRANSESOPHAGEAL ECHOCARDIOGRAM (TEE);  Surgeon: Jake BatheMark C Skains, MD;  [facility];  Service: Cardiovascular;  Laterality: N/A;    reports that he has never smoked. He does not have any smokeless tobacco history on file. He reports that he drinks alcohol. He reports that he does not use illicit drugs. family history includes Heart disease (age of onset: 5058) in his father; Hypertension in his mother; Stroke in his mother. No Known Allergies    Review of Systems  Constitutional: Negative for fatigue.  Eyes: Negative for visual disturbance.  Respiratory: Negative for cough, chest tightness and shortness of breath.   Cardiovascular: Negative for chest pain, palpitations and leg swelling.  Neurological: Negative for dizziness, syncope, weakness, light-headedness and headaches.       Objective:   Physical Exam  Constitutional: He is oriented to person, place, and time. He appears well-developed and well-nourished.  HENT:  Right Ear: External ear normal.  Left Ear: External ear normal.  Mouth/Throat: Oropharynx is clear and moist.  Eyes: Pupils are equal, round, and reactive to light.  Neck: Neck supple. No thyromegaly present.  Cardiovascular: Normal rate and  regular rhythm.   Pulmonary/Chest: Effort normal and breath sounds normal. No respiratory distress. He has no wheezes. He has no rales.  Musculoskeletal: He exhibits no edema.  We cannot appreciate any weakness in his right upper or lower extremities.  Neurological: He is alert and oriented to person, place, and time. No cranial  nerve deficit. Coordination normal.  Gait normal          Assessment & Plan:  #1 recent acute stroke. Patient had extensive workup as above. Continue aspirin. Continue good blood pressure control and recently started on atorvastatin 80 mg once daily. He's not had any new symptoms. He has Loop recorder which he goes for assessment with cardiology later today. Continue occupational therapy.  Strongly advised to avoid further smoking.  PT has signed off.  Follow up with Neurology in about one month. #2 dyslipidemia. Goal LDL less than 70. We ordered future lipids and hepatic in about 6 weeks. #3 hypertension which is stable and at goal with reading today 128/78

## 2014-12-07 NOTE — Therapy (Signed)
[facility] [address] [place], [place], [address] Phone: [phone]   Fax:  [phone]  Occupational Therapy Treatment  Patient Details  Name: Jesse Pacheco MRN: [id number] Date of Birth: 15-Aug-1961 Referring Provider:  Kristian Covey, MD  Encounter Date: 12/06/2014      OT End of Session - 12/06/14 1536    Visit Number 2   Number of Visits 17   Date for OT Re-Evaluation 01/30/15   OT Start Time 1540   OT Stop Time 1615   OT Time Calculation (min) 35 min   Activity Tolerance Patient tolerated treatment well   Behavior During Therapy [facility] for tasks assessed/performed      Past Medical History  Diagnosis Date  . HYPERTENSION 01/09/2009  . VIRAL URI 03/22/2010  . 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 Nocturia 01/09/2009  . Depression     Past Surgical History  Procedure Laterality Date  . Appendectomy    . Spine surgery  2006, 2009    laminectomy  . Loop recorder implant N/A 11/29/2014    Procedure: LOOP RECORDER IMPLANT;  Surgeon: Hillis Range, MD;  Location: [facility] CATH LAB;  Service: Cardiovascular;  Laterality: N/A;  . Tee without cardioversion N/A 11/29/2014    Procedure: TRANSESOPHAGEAL ECHOCARDIOGRAM (TEE);  Surgeon: Jake Bathe, MD;  Location: [facility] ENDOSCOPY;  Service: Cardiovascular;  Laterality: N/A;    There were no vitals filed for this visit.  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   Currently in Pain? No/denies      Treatment: letter cancellation and basic environmental scanning both with 100% accuracy. Pt was instructed in blue putty HEP and Green theraband HEP. Pt.returned demonstration following instruction.                       OT Education - 12/07/14 1707    Education provided Yes   Education Details HEP theraband green, putty blue   Person(s) Educated Patient;Other (comment)   Methods Explanation;Demonstration;Verbal cues;Handout   Comprehension Verbalized understanding;Returned demonstration          OT Short Term Goals - 12/02/14 1338    OT SHORT TERM GOAL #1   Title I with intial HEP.   Baseline 12/31/14   Time 4   Period Weeks   Status New   OT SHORT TERM GOAL #2   Title Pt will verbalize understanding of compensatory strategies for short term memory deficits.   Time 4   Period Weeks   Status New           OT Long Term Goals - 12/02/14 1340    OT LONG TERM GOAL #1   Title Pt will decrease RUE fine motor 9 hole peg test score to 23 secs or less for increased functional use.   Time 8   Period Weeks   Status New   OT LONG TERM GOAL #2   Title Pt will demonstrate ability to perform a physical and cognitive task simultaneously with 95% accuracy.   Time 8   Period Weeks   Status New   OT LONG TERM GOAL #3   Title Pt will perform environmental scanning in a busy environment with 95 % accuracy in prep for work/ driving.   Time 8  Period Weeks   Status New   OT LONG TERM GOAL #4   Title Pt will increase RUE grip strength by 8 lbs for increased functional use.   Time 8   Period Weeks   Status New               Plan - 12/06/14 1708    Clinical Impression Statement Pt is progressing towards goals for strength.   Rehab Potential Good   Plan reinforce HEP, strengthening, cognition   Consulted and Agree with Plan of Care Patient;Family member/caregiver        Problem List Patient Active Problem List   Diagnosis Date Noted  . Stroke 11/27/2014  . Hyperlipidemia 11/27/2014  . Acute ischemic stroke   . Transient confusion   . 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 Shoulder bursitis 10/24/2014  . Elevated PSA 09/22/2012  . VIRAL URI 03/22/2010  . IMPOTENCE OF ORGANIC  ORIGIN 01/27/2009  . Essential hypertension 01/09/2009  . NOCTURIA 01/09/2009    Sipriano Fendley 12/07/2014, 5:12 PM Keene BreathKathryn Perkins Molina, OTR/L [phone] Phone: [phone] 5:12 PM 12/07/2014 [facility] [address] [place], [place], [address] Phone: [phone]   Fax:  [phone]

## 2014-12-07 NOTE — Progress Notes (Signed)
Pre visit review using our clinic review tool, if applicable. No additional management support is needed unless otherwise documented below in the visit note. 

## 2014-12-09 ENCOUNTER — Ambulatory Visit: Payer: 59 | Admitting: Occupational Therapy

## 2014-12-09 DIAGNOSIS — R278 Other lack of coordination: Secondary | ICD-10-CM

## 2014-12-09 DIAGNOSIS — R6889 Other general symptoms and signs: Secondary | ICD-10-CM

## 2014-12-09 DIAGNOSIS — R29898 Other symptoms and signs involving the musculoskeletal system: Secondary | ICD-10-CM

## 2014-12-09 DIAGNOSIS — R279 Unspecified lack of coordination: Secondary | ICD-10-CM

## 2014-12-09 DIAGNOSIS — R262 Difficulty in walking, not elsewhere classified: Secondary | ICD-10-CM | POA: Diagnosis not present

## 2014-12-09 DIAGNOSIS — I69319 Unspecified symptoms and signs involving cognitive functions following cerebral infarction: Secondary | ICD-10-CM

## 2014-12-09 NOTE — Therapy (Signed)
[facility] [address] [place], [place], [address] Phone: [phone]   Fax:  [phone]  Occupational Therapy Treatment  Patient Details  Name: Jesse Pacheco MRN: [id number] Date of Birth: 03/25/1961 Referring Provider:  Kristian CoveyBurchette, Bruce W, MD  Encounter Date: 12/09/2014      OT End of Session - 12/09/14 1538    Visit Number 3   Number of Visits 17   Date for OT Re-Evaluation 01/30/15   OT Start Time 1534   OT Stop Time 1615   OT Time Calculation (min) 41 min   Activity Tolerance Patient tolerated treatment well   Behavior During Therapy [facility] for tasks assessed/performed      Past Medical History  Diagnosis Date  . HYPERTENSION 01/09/2009  . VIRAL URI 03/22/2010  . 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 Nocturia 01/09/2009  . Depression     Past Surgical History  Procedure Laterality Date  . Appendectomy    . Spine surgery  2006, 2009    laminectomy  . Loop recorder implant N/A 11/29/2014    Procedure: LOOP RECORDER IMPLANT;  Surgeon: Hillis RangeJames Allred, MD;  [facility];  Service: Cardiovascular;  Laterality: N/A;  . Tee without cardioversion N/A 11/29/2014    Procedure: TRANSESOPHAGEAL ECHOCARDIOGRAM (TEE);  Surgeon: Jake BatheMark C Skains, MD;  [facility];  Service: Cardiovascular;  Laterality: N/A;    There were no vitals filed for this visit.  Visit Diagnosis:  RUE weakness  Decreased coordination  Decreased functional activity tolerance  Cognitive deficits following cerebral infarction      Subjective Assessment - 12/09/14 1538    Currently in Pain? No/denies          Treatment: Reviewed blue theraband HEP, min v.c. initally for speed then pt returned demonstration.  Functional overhead reaching to copy small peg design  with RUE, 2 lbs weight on right wrist, min error, pt was able to correct with minv.c. UBE x 5 mins level 8 resistance for conditioning, pt was able to  maintain 35-40 RPM Multi tasking for physical and cognitive task, dribbling ball with RUE while performing category generation task mod v.c./ difficulty.                    OT Education - 12/09/14 1601    Education provided Yes   Education Details HEP blue theraband   Person(s) Educated Patient;Caregiver(s)   Methods Explanation;Demonstration;Verbal cues   Comprehension Verbalized understanding;Returned demonstration          OT Short Term Goals - 12/02/14 1338    OT SHORT TERM GOAL #1   Title I with intial HEP.   Baseline 12/31/14   Time 4   Period Weeks   Status New   OT SHORT TERM GOAL #2   Title Pt will verbalize understanding of compensatory strategies for short term memory deficits.   Time 4   Period Weeks   Status New           OT Long Term Goals - 12/02/14 1340    OT LONG TERM GOAL #1   Title Pt will decrease RUE fine motor 9 hole peg test score to 23 secs or less for increased functional use.   Time 8   Period Weeks   Status New   OT LONG TERM GOAL #2   Title Pt will demonstrate ability to perform a physical and cognitive task simultaneously with 95% accuracy.   Time 8   Period Weeks  Status New   OT LONG TERM GOAL #3   Title Pt will perform environmental scanning in a busy environment with 95 % accuracy in prep for work/ driving.   Time 8   Period Weeks   Status New   OT LONG TERM GOAL #4   Title Pt will increase RUE grip strength by 8 lbs for increased functional use.   Time 8   Period Weeks   Status New               Plan - 12/09/14 1556    Clinical Impression Statement Pt is progressing towards goals,. He demonstratesunderstanding of HEP.   Plan strengthening, coordiantion, cognition   Consulted and Agree with Plan of Care Patient;Family member/caregiver        Problem List Patient Active Problem List   Diagnosis Date Noted  . Stroke 11/27/2014  . Hyperlipidemia 11/27/2014  . Acute ischemic stroke   . Transient  confusion   . 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 Shoulder bursitis 10/24/2014  . Elevated PSA 09/22/2012  . VIRAL URI 03/22/2010  . 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 Essential hypertension 01/09/2009  . NOCTURIA 01/09/2009    Jesse Pacheco 12/09/2014, 4:29 PM Keene Breath, OTR/L [phone] Phone: [phone] 4:29 PM 12/09/2014 [facility] Out[facility] Rehabilitation [facility] [address] [place], [place], [address] Phone: [phone]   Fax:  [phone]

## 2014-12-12 ENCOUNTER — Ambulatory Visit: Payer: 59

## 2014-12-13 ENCOUNTER — Ambulatory Visit: Payer: 59

## 2014-12-13 ENCOUNTER — Ambulatory Visit (INDEPENDENT_AMBULATORY_CARE_PROVIDER_SITE_OTHER): Payer: 59 | Admitting: *Deleted

## 2014-12-13 ENCOUNTER — Ambulatory Visit: Payer: 59 | Admitting: Occupational Therapy

## 2014-12-13 VITALS — BP 129/84

## 2014-12-13 DIAGNOSIS — R262 Difficulty in walking, not elsewhere classified: Secondary | ICD-10-CM | POA: Diagnosis not present

## 2014-12-13 DIAGNOSIS — I639 Cerebral infarction, unspecified: Secondary | ICD-10-CM

## 2014-12-13 DIAGNOSIS — R410 Disorientation, unspecified: Secondary | ICD-10-CM

## 2014-12-13 DIAGNOSIS — Z7409 Other reduced mobility: Principal | ICD-10-CM

## 2014-12-13 DIAGNOSIS — R4701 Aphasia: Secondary | ICD-10-CM

## 2014-12-13 DIAGNOSIS — R531 Weakness: Secondary | ICD-10-CM

## 2014-12-13 DIAGNOSIS — R29898 Other symptoms and signs involving the musculoskeletal system: Secondary | ICD-10-CM

## 2014-12-13 DIAGNOSIS — R279 Unspecified lack of coordination: Secondary | ICD-10-CM

## 2014-12-13 DIAGNOSIS — R278 Other lack of coordination: Secondary | ICD-10-CM

## 2014-12-13 DIAGNOSIS — IMO0002 Reserved for concepts with insufficient information to code with codable children: Secondary | ICD-10-CM

## 2014-12-13 DIAGNOSIS — R6889 Other general symptoms and signs: Secondary | ICD-10-CM

## 2014-12-13 DIAGNOSIS — I69319 Unspecified symptoms and signs involving cognitive functions following cerebral infarction: Secondary | ICD-10-CM

## 2014-12-13 LAB — MDC_IDC_ENUM_SESS_TYPE_INCLINIC
Date Time Interrogation Session: 20160412164736
MDC IDC SET ZONE DETECTION INTERVAL: 3000 ms
Zone Setting Detection Interval: 2000 ms
Zone Setting Detection Interval: 320 ms

## 2014-12-13 NOTE — Therapy (Signed)
[facility] [address] [place], [place], [address] Phone: [phone]   Fax:  [phone]  Occupational Therapy Treatment  Patient Details  Name: Jesse Pacheco MRN: [id number] Date of Birth: 05-Aug-1961 Referring Provider:  Kristian Covey, MD  Encounter Date: 12/13/2014      OT End of Session - 12/13/14 1453    Visit Number 4   Number of Visits 17   Date for OT Re-Evaluation 01/30/15   OT Start Time 1452   OT Stop Time 1530   OT Time Calculation (min) 38 min   Activity Tolerance Patient tolerated treatment well   Behavior During Therapy [facility] for tasks assessed/performed      Past Medical History  Diagnosis Date  . HYPERTENSION 01/09/2009  . VIRAL URI 03/22/2010  . 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 Nocturia 01/09/2009  . Depression     Past Surgical History  Procedure Laterality Date  . Appendectomy    . Spine surgery  2006, 2009    laminectomy  . Loop recorder implant N/A 11/29/2014    Procedure: LOOP RECORDER IMPLANT;  Surgeon: Hillis Range, MD;  Location: [facility] CATH LAB;  Service: Cardiovascular;  Laterality: N/A;  . Tee without cardioversion N/A 11/29/2014    Procedure: TRANSESOPHAGEAL ECHOCARDIOGRAM (TEE);  Surgeon: Jake Bathe, MD;  Location: [facility] ENDOSCOPY;  Service: Cardiovascular;  Laterality: N/A;    Filed Vitals:   12/13/14 1456  BP: 129/84    Visit Diagnosis:  Decreased functional activity tolerance  Decreased coordination  Cognitive deficits following cerebral infarction  RUE weakness      Subjective Assessment - 12/13/14 1504    Subjective  Pt denies pain   Patient Stated Goals Return to work. Pt is self employed remodeling homes.   Currently in Pain? No/denies        Treatment: Arm bike x 8 mins level 10 for conditioning, pt was able to maintain 35-40 RPM Plank position x 5 reps hold 10 secs, Quadraped lifting alternate UE/ LE with min facillitation/ v.c. Min-mod  difficulty for core stability/ strength Fine motor coordination placing/ removing O'connor pegs with tweezers. Gripper set at 70 lbs for sustained grip to pick up 1 inch blocks.                     OT Education - 12/13/14 1634    Education provided Yes   Education Details memory compensation strategies   Person(s) Educated Patient;Child(ren)   Methods Explanation;Demonstration;Verbal cues;Handout   Comprehension Verbalized understanding          OT Short Term Goals - 12/02/14 1338    OT SHORT TERM GOAL #1   Title I with intial HEP.   Baseline 12/31/14   Time 4   Period Weeks   Status New   OT SHORT TERM GOAL #2   Title Pt will verbalize understanding of compensatory strategies for short term memory deficits.   Time 4   Period Weeks   Status New           OT Long Term Goals - 12/02/14 1340    OT LONG TERM GOAL #1   Title Pt will decrease RUE fine motor 9 hole peg test score to 23 secs or less for increased functional use.   Time 8   Period Weeks   Status New   OT LONG TERM GOAL #2   Title Pt will demonstrate ability to perform a physical and cognitive task simultaneously with 95% accuracy.  Time 8   Period Weeks   Status New   OT LONG TERM GOAL #3   Title Pt will perform environmental scanning in a busy environment with 95 % accuracy in prep for work/ driving.   Time 8   Period Weeks   Status New   OT LONG TERM GOAL #4   Title Pt will increase RUE grip strength by 8 lbs for increased functional use.   Time 8   Period Weeks   Status New               Plan - 12/13/14 1505    Clinical Impression Statement Pt is progressing towards goals for UE strength and coordination.   Plan strength, divided/ alternating attention   Consulted and Agree with Plan of Care Patient;Family member/caregiver   Family Member Consulted daughter        Problem List Patient Active Problem List   Diagnosis Date Noted  . Stroke 11/27/2014  .  Hyperlipidemia 11/27/2014  . Acute ischemic stroke   . Transient confusion   . 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 Shoulder bursitis 10/24/2014  . Elevated PSA 09/22/2012  . VIRAL URI 03/22/2010  . 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 Essential hypertension 01/09/2009  . NOCTURIA 01/09/2009    Kailoni Vahle 12/13/2014, 4:34 PM Keene BreathKathryn Lakedra Washington, OTR/L [phone] Phone: [phone] 4:34 PM 12/13/2014 [facility] [address] [place], [place], [address] Phone: [phone]   Fax:  [phone]

## 2014-12-13 NOTE — Therapy (Signed)
[facility] [address] [place], [place], [address] Phone: [phone]   Fax:  [phone]  Physical Therapy Treatment  Patient Details  Name: Jesse Pacheco MRN: [id number] Date of Birth: June 24, 1961 Referring Provider:  Edsel Petrin, DO  Encounter Date: 12/13/2014      PT End of Session - 12/13/14 1459    Visit Number 2   Number of Visits 17   Date for PT Re-Evaluation 01/31/15   Authorization Type [employer]-check visit limit   PT Start Time 1317   PT Stop Time 1356   PT Time Calculation (min) 39 min   Equipment Utilized During Treatment Gait belt   Activity Tolerance Patient tolerated treatment well   Behavior During Therapy [facility] for tasks assessed/performed      Past Medical History  Diagnosis Date  . HYPERTENSION 01/09/2009  . VIRAL URI 03/22/2010  . 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 Nocturia 01/09/2009  . Depression     Past Surgical History  Procedure Laterality Date  . Appendectomy    . Spine surgery  2006, 2009    laminectomy  . Loop recorder implant N/A 11/29/2014    Procedure: LOOP RECORDER IMPLANT;  Surgeon: Hillis Range, MD;  Location: [facility] CATH LAB;  Service: Cardiovascular;  Laterality: N/A;  . Tee without cardioversion N/A 11/29/2014    Procedure: TRANSESOPHAGEAL ECHOCARDIOGRAM (TEE);  Surgeon: Jake Bathe, MD;  Location: [facility] ENDOSCOPY;  Service: Cardiovascular;  Laterality: N/A;    There were no vitals filed for this visit.  Visit Diagnosis:  Decreased strength, endurance, and mobility  Decreased activity tolerance  Difficulty walking down slope  Difficulty walking down stairs      Subjective Assessment - 12/13/14 1321    Subjective Pt denied falls or changes since last visit.    Pertinent History Hypertension, left shoulder bursitis (x2 months)   Patient Stated Goals to improve balance   Currently in Pain? No/denies      Neuro re-ed: All performed with  supervision to min guard. Pt performed overhead and low (squatting) tasks with and without 3 pound weights to mimic a hammer, no UE support. VC's to decrease speed of movement, as pt experienced increased postural sway when turning too quickly. Pt ascended/descend 8' ladder and performed overhead tasks without LOB, without UE support.  Pt carried ladder approx. 107' without LOB, but required cues to avoid obstacles.  -In corner with chair; B LEs with 10-30 second holds on foam, 2 sets: feet apart together with and without head turns, feet apart/together with eyes close, tandem stance, and B hip marches x10/LE. Ant/post. Limits of stability with B LEs x10. VC's for technique and to improve upright posture.    Pt completed ABC scale: 94.375%.                     PT Education - 12/13/14 1459    Education provided Yes   Education Details Balance HEP   Person(s) Educated Patient;Child(ren)   Methods Explanation;Demonstration;Verbal cues;Handout   Comprehension Verbalized understanding;Returned demonstration          PT Short Term Goals - 12/13/14 1501    PT SHORT TERM GOAL #1   Title Demonstrate correct performance of HEP. Target 12/30/14   Status On-going   PT SHORT TERM GOAL #2   Title Demonstrate and verbalize increased confidence when negotiating stairs and ramp without UE support and without guarded technique for improved community and work site access. Target 12/30/14  Status On-going   PT SHORT TERM GOAL #3   Title Perform overhead reaching activities independently, while standing on compliant surfaces, without LOB in order to perform work duties for 15 minutes. Target 12/30/14   Status On-going   PT SHORT TERM GOAL #4   Title Assess ability to climb ladder x10, and carry ladder 100' independently, without LOB in order to perform work activities. Target 12/30/14   Status On-going   PT SHORT TERM GOAL #5   Title Demonstrate ability to lift a box loaded with 40# overhead  with safe technique, independently for progress toward lifting on work site (able to lift loaded box with 30# overhead on eval). Target 12/30/14   Status On-going           PT Long Term Goals - 12/13/14 1503    PT LONG TERM GOAL #1   Title Increase DGI score to 24/24 for improved dynamic balance. Target 01/31/15   Status On-going   PT LONG TERM GOAL #2   Title Demonstrate ability to lift a loaded box with 50# overhead with safe and independent performance and normal vitals. Target 01/31/15   Status On-going   PT LONG TERM GOAL #3   Title Overhead upper extremity timed task goal:________________________   Baseline deferred as STG set and LTG not appropriate at this time.   Status Deferred   PT LONG TERM GOAL #4   Title Ambulate on outdoor uneven concrete and grass surfaces while intermittently picking up and carrying 20# and 30# boxes for short distances for progress toward readiness to return to work. Target 01/31/15   Status On-going   PT LONG TERM GOAL #5   Title Activities balance confidence goal (on paper) will increase to 100% to improve quality of life. Target date: 01/31/15.   Status On-going   PT LONG TERM GOAL #6   Title Increase SIS mobility score on FOTO by 15% for improved quality of life. Target 01/31/15   Status On-going               Plan - 12/13/14 1459    Clinical Impression Statement Pt demonstrated progress as he was able to perform high level balance exercises with supervision to min guard. Pt did experience increased postural sway during balance activities on foam, especially with eyes closed indicating impaired vestibular function. Pt would continue to benefit from skilled PT to improve dynamic balance and activity tolerance in order to return to work. PT discussed the possiblity of early d/c from PT if he continues to progress.   Pt will benefit from skilled therapeutic intervention in order to improve on the following deficits Other (comment);Decreased  coordination;Decreased endurance;Decreased activity tolerance;Decreased strength;Decreased balance   Rehab Potential Good   PT Frequency 2x / week   PT Duration 8 weeks   PT Treatment/Interventions Therapeutic exercise;Gait training;Patient/family education;Stair training;Balance training;Neuromuscular re-education;Therapeutic activities   PT Next Visit Plan High level balance and gait activities on compliant surfaces.   PT Home Exercise Plan balance HEP   Consulted and Agree with Plan of Care Patient;Family member/caregiver   Family Member Consulted pt's daughter        Problem List Patient Active Problem List   Diagnosis Date Noted  . Stroke 11/27/2014  . Hyperlipidemia 11/27/2014  . Acute ischemic stroke   . Transient confusion   . 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 Shoulder bursitis 10/24/2014  . Elevated PSA 09/22/2012  . VIRAL URI 03/22/2010  . 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 Essential  hypertension 01/09/2009  . NOCTURIA 01/09/2009    Aiyonna Lucado L 12/13/2014, 3:09 PM  [facility] [address] [place], [place], [address] Phone: [phone]   Fax:  [phone]     Zerita BoersJennifer Damarious Holtsclaw, PT,DPT 12/13/2014 3:09 PM Phone: [phone] Fax: [phone]

## 2014-12-13 NOTE — Progress Notes (Signed)
Wound check in clinic s/p ILR implant. Wpund well healed without redness or edema. Pt with 0 tachy episodes; 0 brady episodes; 0 asystole; 0 symptom episodes; 0 AF episodes. Plan to follow up via [facility] and with JA prn.

## 2014-12-13 NOTE — Patient Instructions (Signed)
Perform all balance exercises in a corner with a chair in front of you for safety:  Feet Together (Compliant Surface) Head Motion - Eyes Open   With eyes open, standing on compliant surface: ___pillow_____, feet together, move head slowly: up and down and side and side for 30 seconds. Repeat _3___ times per session. Do __1__ sessions per day.  Copyright  VHI. All rights reserved.  Feet Together (Compliant Surface) Varied Arm Positions - Eyes Closed   Stand on compliant surface: ___pillow_____ with feet together and arms out. Close eyes and visualize upright position. Hold_30___ seconds. Repeat _3___ times per session. Do _1___ sessions per day.  Copyright  VHI. All rights reserved.  Feet Heel-Toe "Tandem" (Compliant Surface) Varied Arm Positions - Eyes Closed   Stand on compliant surface: ____pillow____ with right foot directly in front of the other and arms out. Close eyes and visualize upright position. Hold__30__ seconds. Repeat _3___ times per session. Do __1__ sessions per day.  Copyright  VHI. All rights reserved.  Weight Shift: Anterior / Posterior (Limits of Stability)    Stand on a pillow. Slowly shift weight backward until toes begin to rise off floor. Return to starting position. Shift weight slowly forward until heels begin to rise off floor. Hold each position __2__ seconds. Repeat __20__ times per session. Do __1__ sessions per day.   Copyright  VHI. All rights reserved.  Marching in Place: Varied Surfaces   March in place while standing on pillow, slowly lifting knees toward ceiling. Repeat __10__ times per leg per session. Do _1___ sessions per day.  Copyright  VHI. All rights reserved.

## 2014-12-13 NOTE — Patient Instructions (Signed)
Pt was provided a handout for divided attention tasks to begin to complete at home on a regular basis

## 2014-12-13 NOTE — Therapy (Signed)
[facility] [address] [place], [place], [address] Phone: [phone]   Fax:  [phone]  Speech Language Pathology Evaluation  Patient Details  Name: Jesse Pacheco MRN: [id number] Date of Birth: [birth year] Referring Provider:  Marvel Plan, MD  Encounter Date: 12/13/2014      End of Session - 12/13/14 1536    Visit Number 1   Number of Visits 16   Date for SLP Re-Evaluation 02/12/15   SLP Start Time 1404   SLP Stop Time  1448   SLP Time Calculation (min) 44 min   Activity Tolerance Patient tolerated treatment well      Past Medical History  Diagnosis Date  . HYPERTENSION 01/09/2009  . VIRAL URI 03/22/2010  . 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 Nocturia 01/09/2009  . Depression     Past Surgical History  Procedure Laterality Date  . Appendectomy    . Spine surgery  2006, 2009    laminectomy  . Loop recorder implant N/A 11/29/2014    Procedure: LOOP RECORDER IMPLANT;  Surgeon: Hillis Range, MD;  Location: [facility] CATH LAB;  Service: Cardiovascular;  Laterality: N/A;  . Tee without cardioversion N/A 11/29/2014    Procedure: TRANSESOPHAGEAL ECHOCARDIOGRAM (TEE);  Surgeon: Jake Bathe, MD;  Location: [facility] ENDOSCOPY;  Service: Cardiovascular;  Laterality: N/A;    There were no vitals filed for this visit.  Visit Diagnosis: Cerebrovascular accident with cognitive communication deficit  Aphasia          SLP Evaluation [facility] - 12/13/14 1354    SLP Visit Information   SLP Received On 12/13/14   Onset Date 11-27-14   Medical Diagnosis CVA   Subjective   Subjective "Just a little difficulty with finding the words and finishing the sentences." (pt)  "I think (ST) is going to be really good for you.Marland KitchenMarland KitchenYou have the most trouble with it." (daughter, Danford Bad)   Pain Assessment   Currently in Pain? No/denies   General Information   HPI Pt is a 54 y.o. male with PMH of HTN, HLD, and elevated PSA- presented to  [facility] 3/27 with stroke-like symptoms. Was unable to move R leg and had some residual R side visual field defect. MRI 3/27 revealed acute infarcts in L ACA/ L posterior MCA/ PCA watershed area. Pt arrives today for ST evaluation. Mild cognitive deficits noted on acute care ST eval.   Prior Functional Status   Cognitive/Linguistic Baseline Within functional limits   Vocation Full time employment   Pain Assessment   Pain Assessment No/denies pain   Cognition   Overall Cognitive Status Impaired/Different from baseline   Area of Impairment Attention   Attention Comments Pt with difficulty and delayed processing time when accomplishing simple written and auditory tasks together    Attention Divided   Divided Attention Impaired   Memory Impairment Decreased short term memory;Other (comment)  pt reports incr'd difficulty with memory following CVA   Auditory Comprehension   Overall Auditory Comprehension Appears within functional limits for tasks assessed   Verbal Expression   Overall Verbal Expression Impaired   Other Verbal Expression Comments Pt with notable hesitation during simple conversation.    Written Expression   Dominant Hand Right   Motor Speech   Overall Motor Speech Appears within functional limits for tasks assessed   Standardized Assessments   Standardized Assessments  Boston Naming Test-2nd edition   Boston Naming Test-2nd edition  WNL  SLP Education - 12/13/14 1505    Education provided Yes   Education Details Divided attention activities, eval results          SLP Short Term Goals - 12/13/14 1719    SLP SHORT TERM GOAL #1   Title pt to demo divided attention with two simple tasks, 95% each task in an appropriate time frame   Time 4   Period Weeks   Status New   SLP SHORT TERM GOAL #2   Title pt to demo compensations for anomia in 10 minutes simple conversation (slow rate, synonym, etc) with rare min A   Time 4   Period Weeks   Status New   SLP  SHORT TERM GOAL #3   Title pt will complete complex naming tasks with 90% success with modified independence   Time 4   Period Weeks   Status New          SLP Long Term Goals - 12/13/14 1721    SLP LONG TERM GOAL #1   Title pt to demo divided attention in mod complex tasks, 95% success on both tasks in appropriate time frame   Time 8   Period Weeks   Status New   SLP LONG TERM GOAL #2   Title pt will demo compensations for attention PRN during therapy tasks independently   Time 8   Period Weeks   Status New   SLP LONG TERM GOAL #3   Title pt will engage in 15 minutes mod complex conversation with modified indpeendence (anomia compensations)   Time 8   Period Weeks   Status New   SLP LONG TERM GOAL #4   Title pt utilize compensations for memory PRN during therapy sessions, appropriately   Time 8   Period Weeks   Status New          Plan - 12/13/14 1717    Clinical Impression Statement Pt with mild cognitive defcits with divided attention, and reports incr'd memory deficits post CVA. Pt also reports word finding deficits - Boston Naming was WNL. However SLP observed hesitations in conversation possibly indicative of deficit with verbal expression.  Pt would benefit from skilled ST addressing higher level cognition, memory, and word finding deficits.   Speech Therapy Frequency 2x / week   Duration --  8 weeks, or 16 visits   Treatment/Interventions Cognitive reorganization;Compensatory strategies;Patient/family education;Functional tasks;Cueing hierarchy;SLP instruction and feedback   Potential to Achieve Goals Good        Problem List Patient Active Problem List   Diagnosis Date Noted  . Stroke 11/27/2014  . Hyperlipidemia 11/27/2014  . Acute ischemic stroke   . Transient confusion   . 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 Shoulder bursitis 10/24/2014  . Elevated PSA 09/22/2012  . VIRAL URI 03/22/2010  . 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 Essential  hypertension 01/09/2009  . NOCTURIA 01/09/2009    Justinn Welter,SLP 12/13/2014, 5:26 PM  [facility] [address] [place], [place], [address] Phone: [phone]   Fax:  [phone]

## 2014-12-13 NOTE — Patient Instructions (Signed)

## 2014-12-14 ENCOUNTER — Telehealth: Payer: Self-pay | Admitting: Occupational Therapy

## 2014-12-16 ENCOUNTER — Ambulatory Visit: Payer: 59

## 2014-12-16 ENCOUNTER — Ambulatory Visit: Payer: 59 | Admitting: Occupational Therapy

## 2014-12-20 ENCOUNTER — Ambulatory Visit: Payer: 59 | Admitting: Occupational Therapy

## 2014-12-20 ENCOUNTER — Ambulatory Visit: Payer: 59

## 2014-12-20 ENCOUNTER — Ambulatory Visit: Payer: 59 | Admitting: Speech Pathology

## 2014-12-20 VITALS — HR 91

## 2014-12-20 DIAGNOSIS — IMO0002 Reserved for concepts with insufficient information to code with codable children: Secondary | ICD-10-CM

## 2014-12-20 DIAGNOSIS — R6889 Other general symptoms and signs: Secondary | ICD-10-CM

## 2014-12-20 DIAGNOSIS — R279 Unspecified lack of coordination: Secondary | ICD-10-CM

## 2014-12-20 DIAGNOSIS — Z7409 Other reduced mobility: Principal | ICD-10-CM

## 2014-12-20 DIAGNOSIS — R278 Other lack of coordination: Secondary | ICD-10-CM

## 2014-12-20 DIAGNOSIS — R531 Weakness: Secondary | ICD-10-CM

## 2014-12-20 DIAGNOSIS — R4701 Aphasia: Secondary | ICD-10-CM

## 2014-12-20 DIAGNOSIS — I69319 Unspecified symptoms and signs involving cognitive functions following cerebral infarction: Secondary | ICD-10-CM

## 2014-12-20 DIAGNOSIS — R262 Difficulty in walking, not elsewhere classified: Secondary | ICD-10-CM

## 2014-12-20 DIAGNOSIS — R29898 Other symptoms and signs involving the musculoskeletal system: Secondary | ICD-10-CM

## 2014-12-20 NOTE — Therapy (Signed)
[facility] [address] [place], [place], [address] Phone: [phone]   Fax:  [phone]  Speech Language Pathology Treatment  Patient Details  Name: Jesse Pacheco MRN: [id number] Date of Birth: 1961/01/01 Referring Provider:  Kristian Covey, MD  Encounter Date: 12/20/2014      End of Session - 12/20/14 1454    Visit Number 2   Number of Visits 16   Date for SLP Re-Evaluation 02/12/15   SLP Start Time 1402   SLP Stop Time  1445   SLP Time Calculation (min) 43 min   Activity Tolerance Patient tolerated treatment well      Past Medical History  Diagnosis Date  . HYPERTENSION 01/09/2009  . VIRAL URI 03/22/2010  . 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 Nocturia 01/09/2009  . Depression     Past Surgical History  Procedure Laterality Date  . Appendectomy    . Spine surgery  2006, 2009    laminectomy  . Loop recorder implant N/A 11/29/2014    Procedure: LOOP RECORDER IMPLANT;  Surgeon: Hillis Range, MD;  Location: [facility] CATH LAB;  Service: Cardiovascular;  Laterality: N/A;  . Tee without cardioversion N/A 11/29/2014    Procedure: TRANSESOPHAGEAL ECHOCARDIOGRAM (TEE);  Surgeon: Jake Bathe, MD;  Location: [facility] ENDOSCOPY;  Service: Cardiovascular;  Laterality: N/A;    There were no vitals filed for this visit.  Visit Diagnosis: No diagnosis found.      Subjective Assessment - 12/20/14 1402    Subjective "I've been doing very well"               ADULT SLP TREATMENT - 12/20/14 1402    Treatment Provided   Treatment provided Cognitive-Linquistic   Cognitive-Linquistic Treatment   Treatment focused on Cognition;Aphasia   Skilled Treatment Pt divided attention playing card game with 3 rules at one time with extended time and frequent redirections to detailed rules as well as visual cues for rules. Facilitated  complex naming with pt genterating 2-3 sentences indicating multiple meaings of complex  words with supervision cues, 95% accuracy.  Divide attention between word puzzle while namine bills and coins that add  up to given amount with occassional repetition required of amouth - pt rarely had to write down amount to recall it.  Pt utlized slow rate/compensation during structures speech task with occassional cueing.   Assessment / Recommendations / Plan   Plan Continue with current plan of care   Progression Toward Goals   Progression toward goals Progressing toward goals          SLP Education - 12/20/14 1453    Education provided Yes   Education Details compensation for aphasia   Person(s) Educated Patient   Methods Explanation;Demonstration   Comprehension Verbalized understanding          SLP Short Term Goals - 12/20/14 1456    SLP SHORT TERM GOAL #1   Title pt to demo divided attention with two simple tasks, 95% each task in an appropriate time frame   Time 4   Period Weeks   Status On-going   SLP SHORT TERM GOAL #2   Title pt to demo compensations for anomia in 10 minutes simple conversation (slow rate, synonym, etc) with rare min A   Time 4   Period Weeks   Status On-going   SLP SHORT TERM GOAL #3   Title pt will complete complex naming tasks with 90% success with modified independence   Time 4  Period Weeks   Status On-going          SLP Long Term Goals - 12/20/14 1456    SLP LONG TERM GOAL #1   Title pt to demo divided attention in mod complex tasks, 95% success on both tasks in appropriate time frame   Time 8   Period Weeks   Status On-going   SLP LONG TERM GOAL #2   Title pt will demo compensations for attention PRN during therapy tasks independently   Time 8   Period Weeks   Status On-going   SLP LONG TERM GOAL #3   Title pt will engage in 15 minutes mod complex conversation with modified indpeendence (anomia compensations)   Time 8   Period Weeks   Status On-going   SLP LONG TERM GOAL #4   Title pt utilize compensations for memory PRN  during therapy sessions, appropriately   Time 8   Period Weeks   Status On-going          Plan - 12/20/14 1454    Clinical Impression Statement Pt required extended time and min A for divided attention. High level word finding with supervision A. Continue skilled ST to maxmize high level cogntion and word finding.   Speech Therapy Frequency 2x / week   Treatment/Interventions Cognitive reorganization;Compensatory strategies;Patient/family education;Functional tasks;Cueing hierarchy;SLP instruction and feedback   Potential to Achieve Goals Good        Problem List Patient Active Problem List   Diagnosis Date Noted  . Stroke 11/27/2014  . Hyperlipidemia 11/27/2014  . Acute ischemic stroke   . Transient confusion   . 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 Shoulder bursitis 10/24/2014  . Elevated PSA 09/22/2012  . VIRAL URI 03/22/2010  . 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 Essential hypertension 01/09/2009  . NOCTURIA 01/09/2009    Lovvorn, Radene JourneyLaura Ann , SLP  12/20/2014, 2:58 PM  [facility] [address] [place], [place], [address] Phone: [phone]   Fax:  [phone]

## 2014-12-20 NOTE — Patient Instructions (Signed)
Divided attention homework provided 

## 2014-12-20 NOTE — Therapy (Signed)
[facility] [address] [place], [place], [address] Phone: [phone]   Fax:  [phone]  Occupational Therapy Treatment  Patient Details  Name: Jesse Pacheco MRN: [id number] Date of Birth: [birth year] Referring Provider:  Kristian Covey, MD  Encounter Date: 12/20/2014      OT End of Session - 12/20/14 1458    Visit Number 5   Number of Visits 17   Date for OT Re-Evaluation 01/30/15   OT Start Time 1448   OT Stop Time 1530   OT Time Calculation (min) 42 min   Activity Tolerance Patient tolerated treatment well   Behavior During Therapy [facility] for tasks assessed/performed      Past Medical History  Diagnosis Date  . HYPERTENSION 01/09/2009  . VIRAL URI 03/22/2010  . 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 Nocturia 01/09/2009  . Depression     Past Surgical History  Procedure Laterality Date  . Appendectomy    . Spine surgery  2006, 2009    laminectomy  . Loop recorder implant N/A 11/29/2014    Procedure: LOOP RECORDER IMPLANT;  Surgeon: Hillis Range, MD;  Location: [facility] CATH LAB;  Service: Cardiovascular;  Laterality: N/A;  . Tee without cardioversion N/A 11/29/2014    Procedure: TRANSESOPHAGEAL ECHOCARDIOGRAM (TEE);  Surgeon: Jake Bathe, MD;  Location: [facility] ENDOSCOPY;  Service: Cardiovascular;  Laterality: N/A;    There were no vitals filed for this visit.  Visit Diagnosis:  Decreased strength, endurance, and mobility  Decreased activity tolerance  Decreased coordination  Cognitive deficits following cerebral infarction  RUE weakness      Subjective Assessment - 12/20/14 1453    Subjective  Pt reports he is feeling better   Patient Stated Goals Return to work. Pt is self employed remodeling homes.   Currently in Pain? No/denies      Treatment: Plank position x 5 reps hold 10 secs, then quadraped lifting alternate UE/LE, min facitllitation/ v.c. UBE x 8 mins level 8, pt was able to  maintain 35-40 RPM for conditioning.  Functional reaching in standing from low surface to overhead, to copy small peg design with RUE, 1 v.c. For design in mod distracting environment. Ambulating while juggling and performing category generation, mod drops and min v.c., for improved divided attention and coordination.                           OT Short Term Goals - 12/02/14 1338    OT SHORT TERM GOAL #1   Title I with intial HEP.   Baseline 12/31/14   Time 4   Period Weeks   Status New   OT SHORT TERM GOAL #2   Title Pt will verbalize understanding of compensatory strategies for short term memory deficits.   Time 4   Period Weeks   Status New           OT Long Term Goals - 12/02/14 1340    OT LONG TERM GOAL #1   Title Pt will decrease RUE fine motor 9 hole peg test score to 23 secs or less for increased functional use.   Time 8   Period Weeks   Status New   OT LONG TERM GOAL #2   Title Pt will demonstrate ability to perform a physical and cognitive task simultaneously with 95% accuracy.   Time 8   Period Weeks   Status New   OT LONG TERM GOAL #3  Title Pt will perform environmental scanning in a busy environment with 95 % accuracy in prep for work/ driving.   Time 8   Period Weeks   Status New   OT LONG TERM GOAL #4   Title Pt will increase RUE grip strength by 8 lbs for increased functional use.   Time 8   Period Weeks   Status New               Plan - 12/20/14 1511    Clinical Impression Statement Pt is progressing towards goals with improving strength and activity tolerance.   Plan divided /alternating   Consulted and Agree with Plan of Care Patient        Problem List Patient Active Problem List   Diagnosis Date Noted  . Stroke 11/27/2014  . Hyperlipidemia 11/27/2014  . Acute ischemic stroke   . Transient confusion   . 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 Shoulder bursitis 10/24/2014  . Elevated PSA 09/22/2012  .  VIRAL URI 03/22/2010  . 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 Essential hypertension 01/09/2009  . NOCTURIA 01/09/2009    RINE,KATHRYN 12/20/2014, 3:40 PM Keene BreathKathryn Rine, OTR/L [phone] Phone: [phone] 3:40 PM 12/20/2014 [facility] [address] [place], [place], [address] Phone: [phone]   Fax:  [phone]

## 2014-12-20 NOTE — Therapy (Signed)
[facility] [address] [place], [place], [address] Phone: [phone]   Fax:  [phone]  Physical Therapy Treatment  Patient Details  Name: Jesse Pacheco MRN: [id number] Date of Birth: 1961/01/16 Referring Provider:  [name], MD  Encounter Date: 12/20/2014      PT End of Session - 12/20/14 2045    Visit Number 3   Number of Visits 17   Date for PT Re-Evaluation 01/31/15   Authorization Type [employer]-check visit limit   PT Start Time 1319   PT Stop Time 1357   PT Time Calculation (min) 38 min   Activity Tolerance Patient tolerated treatment well   Behavior During Therapy [facility] for tasks assessed/performed      Past Medical History  Diagnosis Date  . HYPERTENSION 01/09/2009  . VIRAL URI 03/22/2010  . 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 Nocturia 01/09/2009  . Depression     Past Surgical History  Procedure Laterality Date  . Appendectomy    . Spine surgery  2006, 2009    laminectomy  . Loop recorder implant N/A 11/29/2014    Procedure: LOOP RECORDER IMPLANT;  Surgeon: Thompson Grayer, MD;  Location: [facility] CATH LAB;  Service: Cardiovascular;  Laterality: N/A;  . Tee without cardioversion N/A 11/29/2014    Procedure: TRANSESOPHAGEAL ECHOCARDIOGRAM (TEE);  Surgeon: Jerline Pain, MD;  Location: [facility] ENDOSCOPY;  Service: Cardiovascular;  Laterality: N/A;    Filed Vitals:   12/20/14 1348  Pulse: 91  SpO2: 99%    Visit Diagnosis:  Decreased strength, endurance, and mobility  Decreased activity tolerance  Decreased coordination  Difficulty walking down slope  Difficulty walking down stairs      Subjective Assessment - 12/20/14 1321    Subjective Pt denied falls or changes since last visit.   Pertinent History Hypertension, left shoulder bursitis (x2 months)   Patient Stated Goals to improve balance   Currently in Pain? No/denies                         [facility]  Adult PT Treatment/Exercise - 12/20/14 1324    Ambulation/Gait   Ambulation/Gait Yes   Ambulation/Gait Assistance 7: Independent   Ambulation/Gait Assistance Details Pt ambulated 150' while carrying work ladder without LOB but did require one standing rest break 2/2 fatigue. Pt ambulated over uneven terrain, while ambulating forward/backward/marching and with eyes closed without LOB. Pt carried  carried/picked up/put down crate with 20 pounds inside to mimic carrying tool box. Pt ambulated 300' while counting backwards by 3's (starting at 100) or while counting white cars, pt did not slow down gait speed but did have difficulty counting backwards and required cues for correct number.   Ambulation Distance (Feet) 500 Feet  outdoors and 150' indoors, 100'   Assistive device None   Gait Pattern Within Functional Limits   Ambulation Surface Level;Unlevel;Indoor;Outdoor;Paved;Gravel;Grass;Other (comment)  rubber mulch   Stairs Yes   Stairs Assistance 7: Independent  demonstrated safe technique. No LOB.   Stair Management Technique No rails;Alternating pattern   Number of Stairs 12   Height of Stairs 6   Balance   Balance Assessed Yes   Standardized Balance Assessment   Standardized Balance Assessment Dynamic Gait Index   Dynamic Gait Index   Level Surface Normal   Change in Gait Speed Normal   Gait with Horizontal Head Turns Normal   Gait with Vertical Head Turns Normal   Gait and Pivot Turn Normal  Step Over Obstacle Normal   Step Around Obstacles Normal   Steps Normal   Total Score 24   High Level Balance   High Level Balance Activities Braiding;Backward walking;Marching forwards;Figure 8 turns;Negotitating around obstacles;Negotiating over obstacles  forward amb. with eyes closed   High Level Balance Comments Pt performed all high level activities for 50'/activity without LOB.                PT Education - 12/20/14 2045    Education provided Yes   Education Details PT  discussed d/c next visit due to great progress.   Person(s) Educated Patient   Methods Explanation   Comprehension Verbalized understanding          PT Short Term Goals - 12/20/14 2048    PT SHORT TERM GOAL #1   Title Demonstrate correct performance of HEP. Target 12/30/14   Status On-going   PT SHORT TERM GOAL #2   Title Demonstrate and verbalize increased confidence when negotiating stairs and ramp without UE support and without guarded technique for improved community and work site access. Target 12/30/14   Status Achieved   PT SHORT TERM GOAL #3   Title Perform overhead reaching activities independently, while standing on compliant surfaces, without LOB in order to perform work duties for 15 minutes. Target 12/30/14   Status On-going   PT SHORT TERM GOAL #4   Title Assess ability to climb ladder x10, and carry ladder 100' independently, without LOB in order to perform work activities. Target 12/30/14   Status Achieved   PT SHORT TERM GOAL #5   Title Demonstrate ability to lift a box loaded with 40# overhead with safe technique, independently for progress toward lifting on work site (able to lift loaded box with 30# overhead on eval). Target 12/30/14   Status On-going           PT Long Term Goals - 12/20/14 2048    PT LONG TERM GOAL #1   Title Increase DGI score to 24/24 for improved dynamic balance. Target 01/31/15   Status Achieved   PT LONG TERM GOAL #2   Title Demonstrate ability to lift a loaded box with 50# overhead with safe and independent performance and normal vitals. Target 01/31/15   Status On-going   PT LONG TERM GOAL #3   Title Overhead upper extremity timed task goal:________________________   Baseline deferred as STG set and LTG not appropriate at this time.   Status Deferred   PT LONG TERM GOAL #4   Title Ambulate on outdoor uneven concrete and grass surfaces while intermittently picking up and carrying 20# and 30# boxes for short distances for progress toward  readiness to return to work. Target 01/31/15   Status Achieved   PT LONG TERM GOAL #5   Title Activities balance confidence goal (on paper) will increase to 100% to improve quality of life. Target date: 01/31/15.   Status On-going   PT LONG TERM GOAL #6   Title Increase SIS mobility score on FOTO by 15% for improved quality of life. Target 01/31/15   Status On-going               Plan - 12/20/14 2045    Clinical Impression Statement Pt demonstrated great progress as he was able to perform high level balance activities over uneven (grassy) terrain and ambulate without LOB. Pt has difficulty performing cognitive tasks during ambulation but it does not slow his gait speed. Pt met STGs 2 and 4, and LTGs  1 and 4. Will asses additional goals next session and d/c due to pt's excellent progress in PT.   Pt will benefit from skilled therapeutic intervention in order to improve on the following deficits Other (comment);Decreased coordination;Decreased endurance;Decreased activity tolerance;Decreased strength;Decreased balance   PT Frequency 2x / week   PT Duration 8 weeks   PT Treatment/Interventions Therapeutic exercise;Gait training;Patient/family education;Stair training;Balance training;Neuromuscular re-education;Therapeutic activities   PT Next Visit Plan Finish assessing remaining goals and d/c.   PT Home Exercise Plan balance HEP   Consulted and Agree with Plan of Care Patient        Problem List Patient Active Problem List   Diagnosis Date Noted  . Stroke 11/27/2014  . Hyperlipidemia 11/27/2014  . Acute ischemic stroke   . Transient confusion   . 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 Shoulder bursitis 10/24/2014  . Elevated PSA 09/22/2012  . VIRAL URI 03/22/2010  . 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 Essential hypertension 01/09/2009  . NOCTURIA 01/09/2009    Miller,Jennifer L 12/20/2014, 8:49 PM  [facility] [address] [place], [place], [address] Phone: [phone]   Fax:  [phone]     Geoffry Paradise, PT,DPT 12/20/2014 8:49 PM Phone: [phone] Fax: [phone]

## 2014-12-25 ENCOUNTER — Other Ambulatory Visit: Payer: Self-pay | Admitting: Family Medicine

## 2014-12-27 ENCOUNTER — Encounter: Payer: Self-pay | Admitting: Internal Medicine

## 2014-12-29 ENCOUNTER — Ambulatory Visit (INDEPENDENT_AMBULATORY_CARE_PROVIDER_SITE_OTHER): Payer: 59 | Admitting: *Deleted

## 2014-12-29 DIAGNOSIS — I639 Cerebral infarction, unspecified: Secondary | ICD-10-CM

## 2014-12-30 ENCOUNTER — Ambulatory Visit: Payer: 59

## 2014-12-30 ENCOUNTER — Encounter: Payer: 59 | Admitting: Occupational Therapy

## 2015-01-02 NOTE — Therapy (Signed)
[facility] [address] [place], [place], [address] Phone: [phone]   Fax:  [phone]  Patient Details  Name: [name] MRN: [id number] Date of Birth: [birth year] Referring Provider:  No ref. provider found  Encounter Date: 01/02/2015  SPEECH THERAPY DISCHARGE SUMMARY  Visits from Start of Care: 2  Current functional level related to goals / functional outcomes: Pt demonstrated decr'd divided attention during eval and one therapy session. No STGs/LTGs met, due to limited time in therapy. He called clinic 01-02-15 and asked receptionist to cancel remaining appointments due to [place].   Remaining deficits: Assume all deficits noted on eval and in first therapy session last week are remaining.   Education / Equipment: Cues for divided attention during one therapy session.   Plan: Patient agrees to discharge.  Patient goals were not met. Patient is being discharged due to the patient's request.  ?????       [facility], SLP 01/02/2015, 12:14 PM  [facility] [address] [place], [place], [address] Phone: [phone]   Fax:  [phone]

## 2015-01-03 ENCOUNTER — Ambulatory Visit: Payer: 59 | Admitting: Occupational Therapy

## 2015-01-03 ENCOUNTER — Ambulatory Visit: Payer: 59

## 2015-01-03 NOTE — Progress Notes (Signed)
Loop recorder 

## 2015-01-04 NOTE — Therapy (Signed)
[facility] [address] [place], [place], [address] Phone: [phone]   Fax:  [phone]  Patient Details  Name: Jesse Pacheco MRN: [id number] Date of Birth: [birth year] Referring Provider:  No ref. provider found  Encounter Date: 01/04/2015  PHYSICAL THERAPY DISCHARGE SUMMARY  Visits from Start of Care: 3  Current functional level related to goals / functional outcomes:     PT Short Term Goals - 12/20/14 2048    PT SHORT TERM GOAL #1   Title Demonstrate correct performance of HEP. Target 12/30/14   Status On-going   PT SHORT TERM GOAL #2   Title Demonstrate and verbalize increased confidence when negotiating stairs and ramp without UE support and without guarded technique for improved community and work site access. Target 12/30/14   Status Achieved   PT SHORT TERM GOAL #3   Title Perform overhead reaching activities independently, while standing on compliant surfaces, without LOB in order to perform work duties for 15 minutes. Target 12/30/14   Status On-going   PT SHORT TERM GOAL #4   Title Assess ability to climb ladder x10, and carry ladder 100' independently, without LOB in order to perform work activities. Target 12/30/14   Status Achieved   PT SHORT TERM GOAL #5   Title Demonstrate ability to lift a box loaded with 40# overhead with safe technique, independently for progress toward lifting on work site (able to lift loaded box with 30# overhead on eval). Target 12/30/14   Status On-going          PT Long Term Goals - 12/20/14 2048    PT LONG TERM GOAL #1   Title Increase DGI score to 24/24 for improved dynamic balance. Target 01/31/15   Status Achieved   PT LONG TERM GOAL #2   Title Demonstrate ability to lift a loaded box with 50# overhead with safe and independent performance and normal vitals. Target 01/31/15   Status On-going   PT LONG TERM GOAL #3   Title Overhead upper extremity timed task  goal:________________________   Baseline deferred as STG set and LTG not appropriate at this time.   Status Deferred   PT LONG TERM GOAL #4   Title Ambulate on outdoor uneven concrete and grass surfaces while intermittently picking up and carrying 20# and 30# boxes for short distances for progress toward readiness to return to work. Target 01/31/15   Status Achieved   PT LONG TERM GOAL #5   Title Activities balance confidence goal (on paper) will increase to 100% to improve quality of life. Target date: 01/31/15.   Status On-going   PT LONG TERM GOAL #6   Title Increase SIS mobility score on FOTO by 15% for improved quality of life. Target 01/31/15   Status On-going        Remaining deficits: Unknown, as pt did not return since last visit as he is moving out of state. Pt called to cancel all remaining appointments due to his move.   Education / Equipment: HEP  Plan: Patient agrees to discharge.  Patient goals were partially met. Patient is being discharged due to the patient's request.  ?????       Miller,Jennifer L 01/04/2015, 11:51 AM  [facility] [address] [place], [place], [address] Phone: [phone]   Fax:  [phone]   Geoffry Paradise, PT,DPT 01/04/2015 11:51 AM Phone: [phone] Fax: [phone]

## 2015-01-05 ENCOUNTER — Encounter: Payer: 59 | Admitting: Occupational Therapy

## 2015-01-05 ENCOUNTER — Ambulatory Visit: Payer: 59

## 2015-01-10 ENCOUNTER — Ambulatory Visit: Payer: 59

## 2015-01-10 ENCOUNTER — Encounter: Payer: 59 | Admitting: Occupational Therapy

## 2015-01-13 ENCOUNTER — Encounter: Payer: 59 | Admitting: Occupational Therapy

## 2015-01-13 ENCOUNTER — Ambulatory Visit: Payer: 59

## 2015-01-17 ENCOUNTER — Encounter: Payer: 59 | Admitting: Occupational Therapy

## 2015-01-17 ENCOUNTER — Ambulatory Visit: Payer: 59

## 2015-01-17 ENCOUNTER — Other Ambulatory Visit: Payer: 59

## 2015-01-18 ENCOUNTER — Telehealth: Payer: Self-pay | Admitting: Family Medicine

## 2015-01-18 NOTE — Telephone Encounter (Signed)
Pt states he is having side effects of anxiety and sleeplessess, would like to know if you think the atorvastatin (LIPITOR) 80 MG tablet which he just started,  could be the cause.

## 2015-01-18 NOTE — Telephone Encounter (Signed)
Unlikely.  Muscle or joint aches would be more common side effect.

## 2015-01-18 NOTE — Telephone Encounter (Signed)
Pt informed

## 2015-01-20 ENCOUNTER — Ambulatory Visit: Payer: 59

## 2015-01-24 ENCOUNTER — Ambulatory Visit: Payer: 59

## 2015-01-24 ENCOUNTER — Encounter: Payer: 59 | Admitting: Occupational Therapy

## 2015-01-27 ENCOUNTER — Encounter: Payer: 59 | Admitting: Occupational Therapy

## 2015-01-27 ENCOUNTER — Ambulatory Visit (INDEPENDENT_AMBULATORY_CARE_PROVIDER_SITE_OTHER): Payer: 59 | Admitting: *Deleted

## 2015-01-27 DIAGNOSIS — I639 Cerebral infarction, unspecified: Secondary | ICD-10-CM | POA: Diagnosis not present

## 2015-01-30 LAB — CUP PACEART REMOTE DEVICE CHECK
Date Time Interrogation Session: 20160426031206
MDC IDC SET ZONE DETECTION INTERVAL: 2000 ms
MDC IDC SET ZONE DETECTION INTERVAL: 3000 ms
Zone Setting Detection Interval: 320 ms

## 2015-02-01 ENCOUNTER — Ambulatory Visit: Payer: 59

## 2015-02-01 ENCOUNTER — Encounter: Payer: 59 | Admitting: Occupational Therapy

## 2015-02-02 ENCOUNTER — Ambulatory Visit: Payer: Self-pay | Admitting: Neurology

## 2015-02-03 ENCOUNTER — Encounter: Payer: Self-pay | Admitting: Neurology

## 2015-02-07 LAB — CUP PACEART REMOTE DEVICE CHECK: Date Time Interrogation Session: 20160607112614

## 2015-02-07 NOTE — Progress Notes (Signed)
Loop recorder 

## 2015-02-10 ENCOUNTER — Encounter: Payer: Self-pay | Admitting: Internal Medicine

## 2015-02-15 ENCOUNTER — Encounter: Payer: Self-pay | Admitting: Cardiology

## 2015-02-22 ENCOUNTER — Encounter: Payer: Self-pay | Admitting: Cardiology

## 2015-02-27 ENCOUNTER — Ambulatory Visit (INDEPENDENT_AMBULATORY_CARE_PROVIDER_SITE_OTHER): Payer: 59 | Admitting: *Deleted

## 2015-02-27 DIAGNOSIS — I639 Cerebral infarction, unspecified: Secondary | ICD-10-CM

## 2015-02-28 ENCOUNTER — Encounter: Payer: Self-pay | Admitting: Internal Medicine

## 2015-02-28 LAB — CUP PACEART REMOTE DEVICE CHECK
Date Time Interrogation Session: 20160623175835
MDC IDC SET ZONE DETECTION INTERVAL: 2000 ms
Zone Setting Detection Interval: 3000 ms
Zone Setting Detection Interval: 320 ms

## 2015-03-01 NOTE — Progress Notes (Signed)
Loop recorder 

## 2015-03-07 ENCOUNTER — Ambulatory Visit: Payer: 59 | Admitting: Family Medicine

## 2015-03-14 ENCOUNTER — Encounter: Payer: Self-pay | Admitting: Internal Medicine

## 2015-03-29 ENCOUNTER — Ambulatory Visit (INDEPENDENT_AMBULATORY_CARE_PROVIDER_SITE_OTHER): Payer: 59

## 2015-03-29 DIAGNOSIS — I639 Cerebral infarction, unspecified: Secondary | ICD-10-CM

## 2015-04-05 NOTE — Progress Notes (Signed)
Loop recorder 

## 2015-04-14 LAB — CUP PACEART REMOTE DEVICE CHECK: Date Time Interrogation Session: 20160812134046

## 2015-04-20 ENCOUNTER — Encounter: Payer: Self-pay | Admitting: Internal Medicine

## 2015-04-28 ENCOUNTER — Ambulatory Visit (INDEPENDENT_AMBULATORY_CARE_PROVIDER_SITE_OTHER): Payer: Self-pay | Admitting: *Deleted

## 2015-04-28 DIAGNOSIS — I639 Cerebral infarction, unspecified: Secondary | ICD-10-CM

## 2015-05-03 NOTE — Progress Notes (Signed)
Loop recorder 

## 2015-05-12 LAB — CUP PACEART REMOTE DEVICE CHECK: MDC IDC SESS DTM: 20160909111102

## 2015-05-12 NOTE — Progress Notes (Signed)
Carelink summary report received. Battery status OK. Normal device function. No new symptom episodes, tachy episodes, brady, or pause episodes. No new AF episodes. Monthly summary reports and ROV with JA PRN. 

## 2015-05-29 ENCOUNTER — Ambulatory Visit (INDEPENDENT_AMBULATORY_CARE_PROVIDER_SITE_OTHER): Payer: Self-pay | Admitting: *Deleted

## 2015-05-29 DIAGNOSIS — I639 Cerebral infarction, unspecified: Secondary | ICD-10-CM

## 2015-06-01 NOTE — Progress Notes (Signed)
Loop recorder 

## 2015-06-05 ENCOUNTER — Encounter: Payer: Self-pay | Admitting: Internal Medicine

## 2015-06-08 LAB — CUP PACEART REMOTE DEVICE CHECK: Date Time Interrogation Session: 20160925160612

## 2015-06-08 NOTE — Progress Notes (Signed)
Carelink summary report received. Battery status OK. Normal device function. No new symptom episodes, tachy episodes, brady, or pause episodes. No new AF episodes. Monthly summary reports and ROV w/ JA PRN. 

## 2015-06-23 ENCOUNTER — Encounter: Payer: Self-pay | Admitting: Internal Medicine

## 2015-06-27 ENCOUNTER — Other Ambulatory Visit: Payer: Self-pay | Admitting: Family Medicine

## 2015-06-27 ENCOUNTER — Ambulatory Visit (INDEPENDENT_AMBULATORY_CARE_PROVIDER_SITE_OTHER): Payer: Self-pay | Admitting: *Deleted

## 2015-06-27 DIAGNOSIS — I6389 Other cerebral infarction: Secondary | ICD-10-CM

## 2015-06-27 DIAGNOSIS — I638 Other cerebral infarction: Secondary | ICD-10-CM

## 2015-06-28 NOTE — Progress Notes (Signed)
Loop recorder 

## 2015-07-07 ENCOUNTER — Encounter: Payer: Self-pay | Admitting: Cardiology

## 2015-07-13 ENCOUNTER — Encounter: Payer: Self-pay | Admitting: Cardiology

## 2015-07-20 ENCOUNTER — Encounter: Payer: Self-pay | Admitting: Cardiology

## 2015-07-28 LAB — CUP PACEART REMOTE DEVICE CHECK: Date Time Interrogation Session: 20161025160659

## 2015-07-28 NOTE — Progress Notes (Signed)
Carelink summary report received. Battery status OK. Normal device function. No new symptom episodes, tachy episodes, brady, or pause episodes. No new AF episodes. Monthly summary reports and ROV with JA PRN. 

## 2015-07-31 ENCOUNTER — Encounter: Payer: Self-pay | Admitting: *Deleted

## 2015-08-03 ENCOUNTER — Encounter: Payer: Self-pay | Admitting: Cardiology

## 2015-08-11 ENCOUNTER — Encounter: Payer: Self-pay | Admitting: Cardiology

## 2015-08-17 ENCOUNTER — Encounter: Payer: Self-pay | Admitting: Cardiology

## 2015-08-17 IMAGING — CR DG CERVICAL SPINE COMPLETE 4+V
6 series · 6 of 6 positions shown · non-contrast
Comparison: 12/15/2013

CLINICAL DATA: LEFT shoulder pain for 2 months, prior neck fusion
in December 2013

EXAM:
CERVICAL SPINE  4+ VIEWS

[view not recorded (1 of 6)]
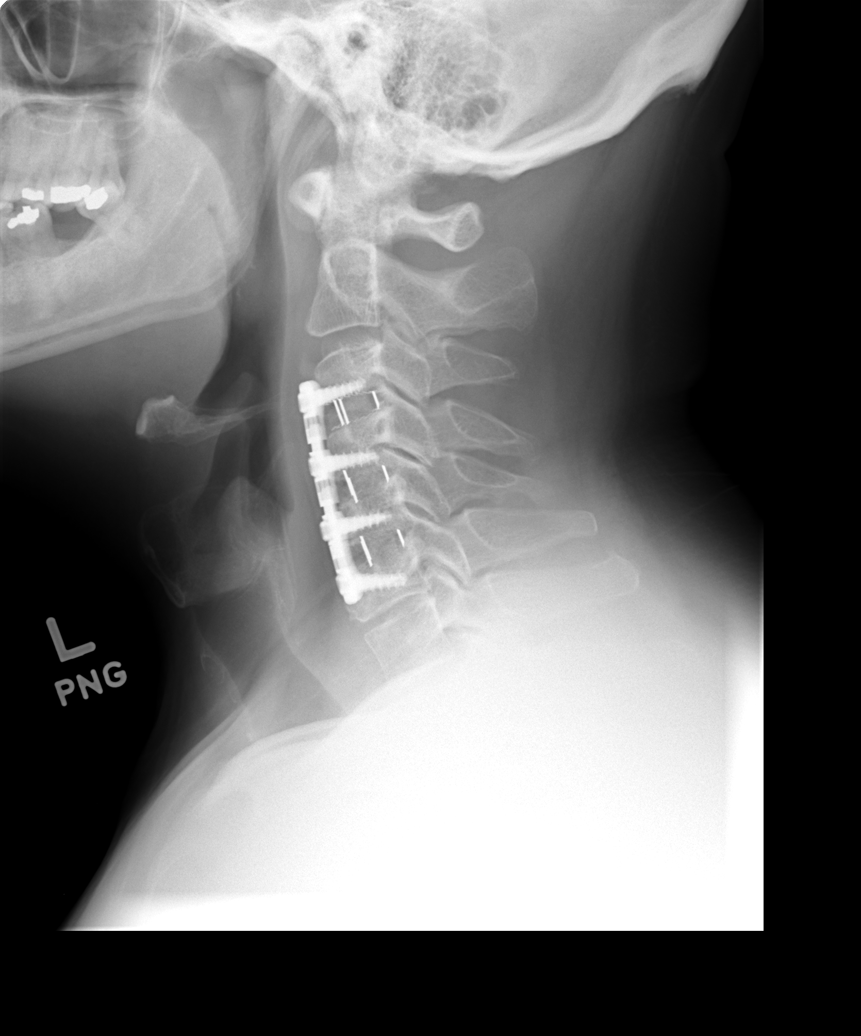

[view not recorded (2 of 6)]
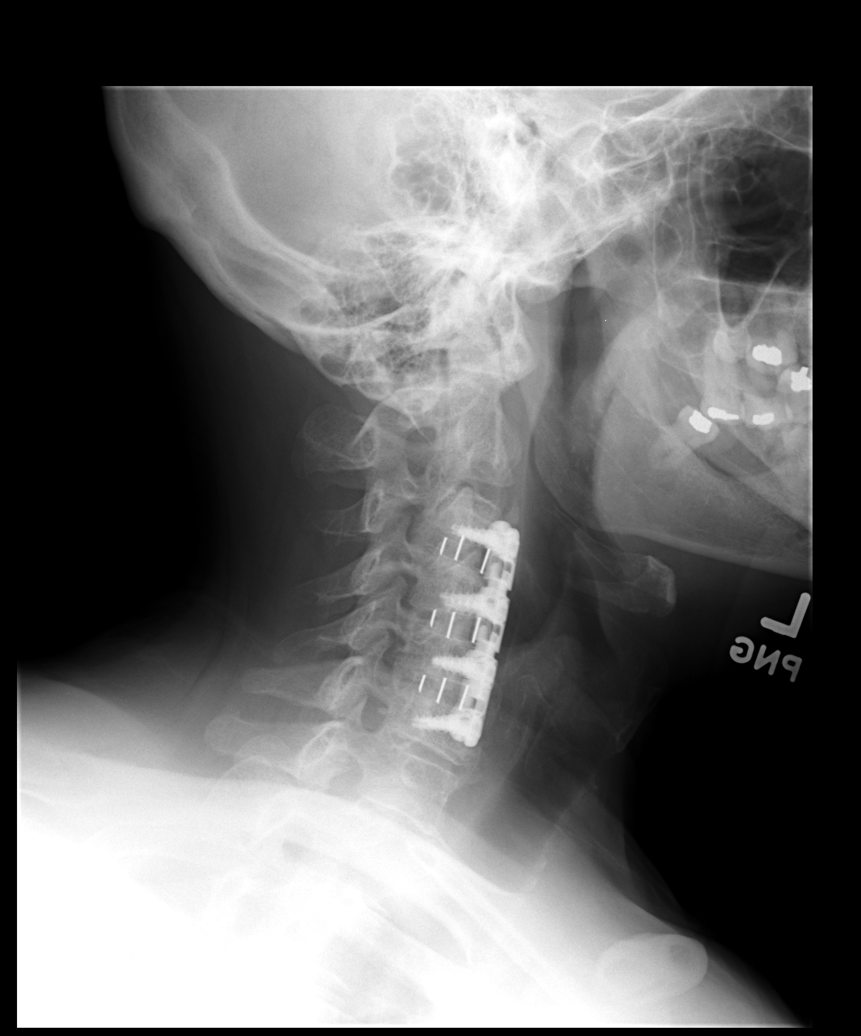

[view not recorded (3 of 6)]
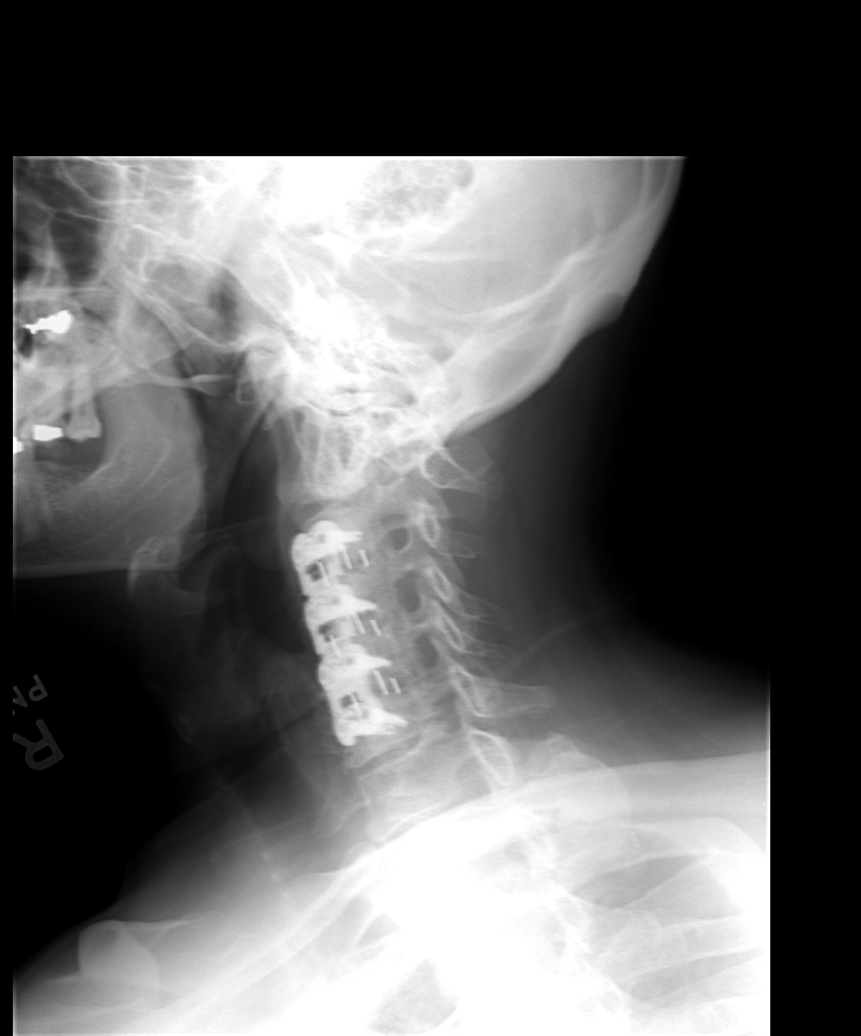

[view not recorded (4 of 6)]
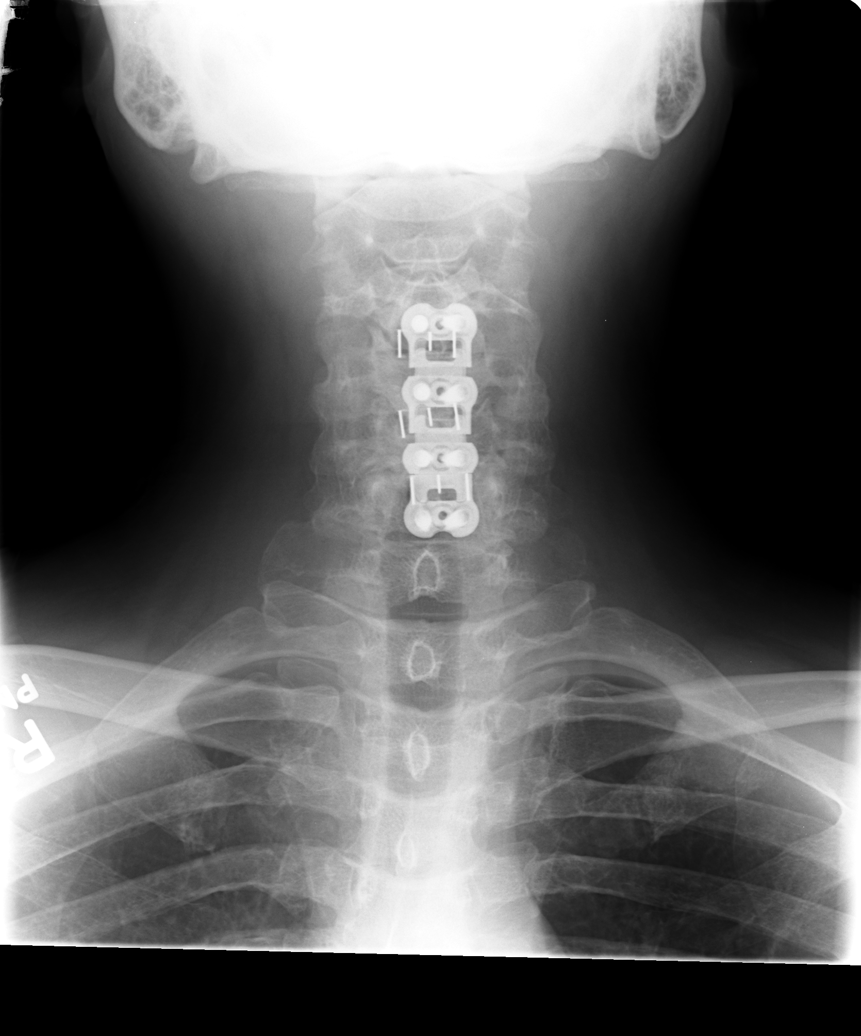

[view not recorded (5 of 6)]
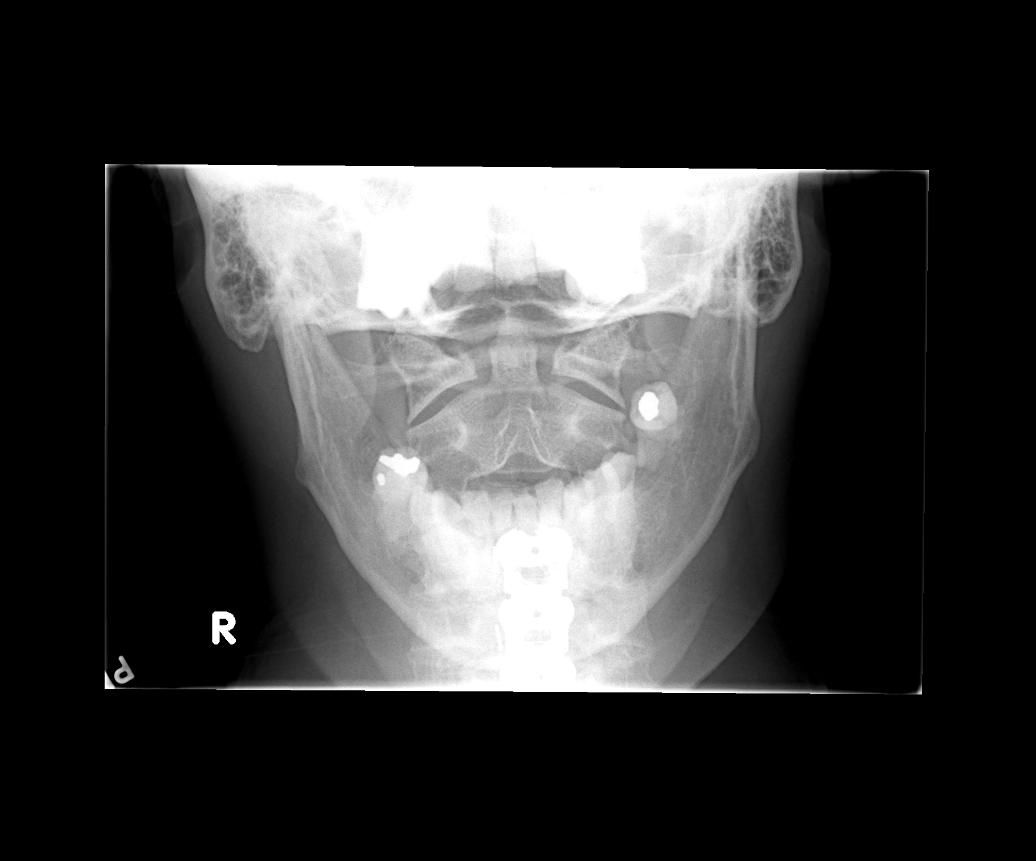

[view not recorded (6 of 6)]
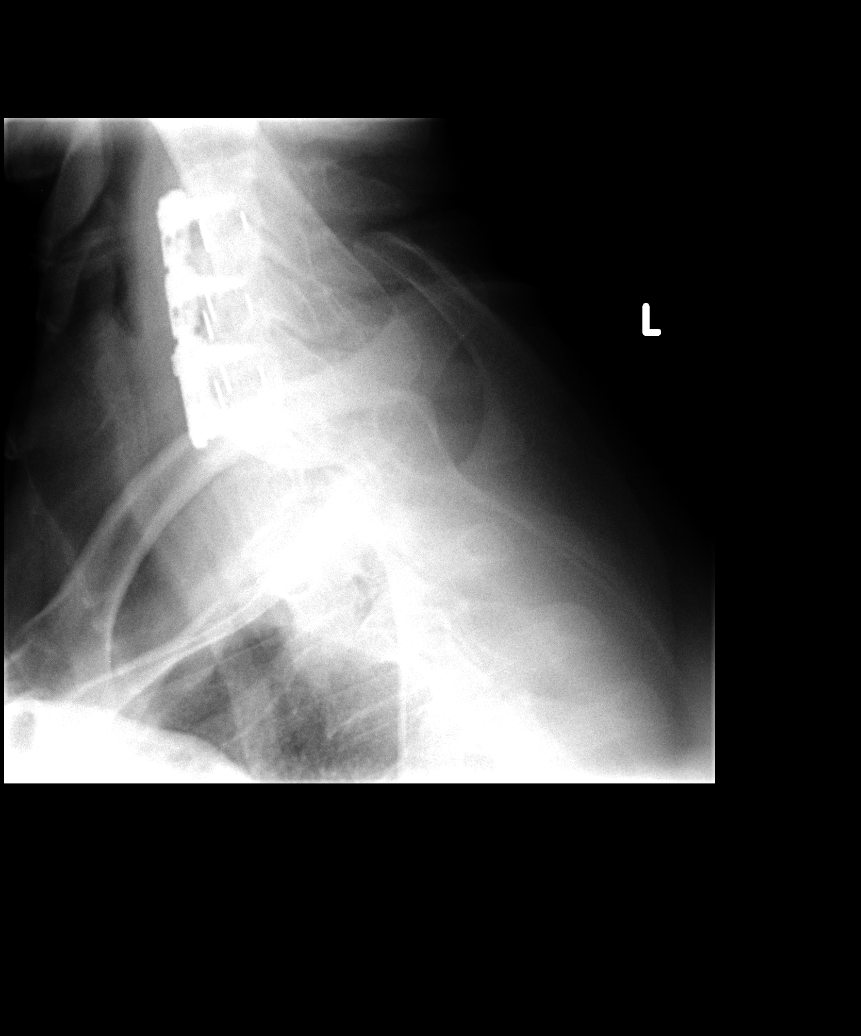

[6 of 6 positions shown; findings below may reference images not displayed]

FINDINGS: Prevertebral soft tissues normal thickness.

Osseous mineralization grossly normal.

Prior anterior fusion of C C3-C6 with intact hardware and stable
disc prostheses at each level.

Mild motion artifacts degrade RPO view.

Narrowing of neural foramina on RIGHT at C3-C4, C4-C5 and C5-C6 as
well as LEFT C6-C7 by uncovertebral spurs.

No acute fracture, subluxation or bone destruction.

Lung apices clear.
IMPRESSION: Prior anterior fusion C3-C6.

Scattered narrowing of neural foramina by uncovertebral spurs.

No acute abnormalities.

## 2015-08-17 IMAGING — CR DG SHOULDER 2+V*L*
3 series · 3 of 3 positions shown · non-contrast
Comparison: None.

CLINICAL DATA: 53-year-old male with left shoulder pain for 2
months. No known injury. Prior cervical spine surgery in 6665.
Initial encounter.

EXAM:
LEFT SHOULDER - 2+ VIEW

[view not recorded (1 of 3)]
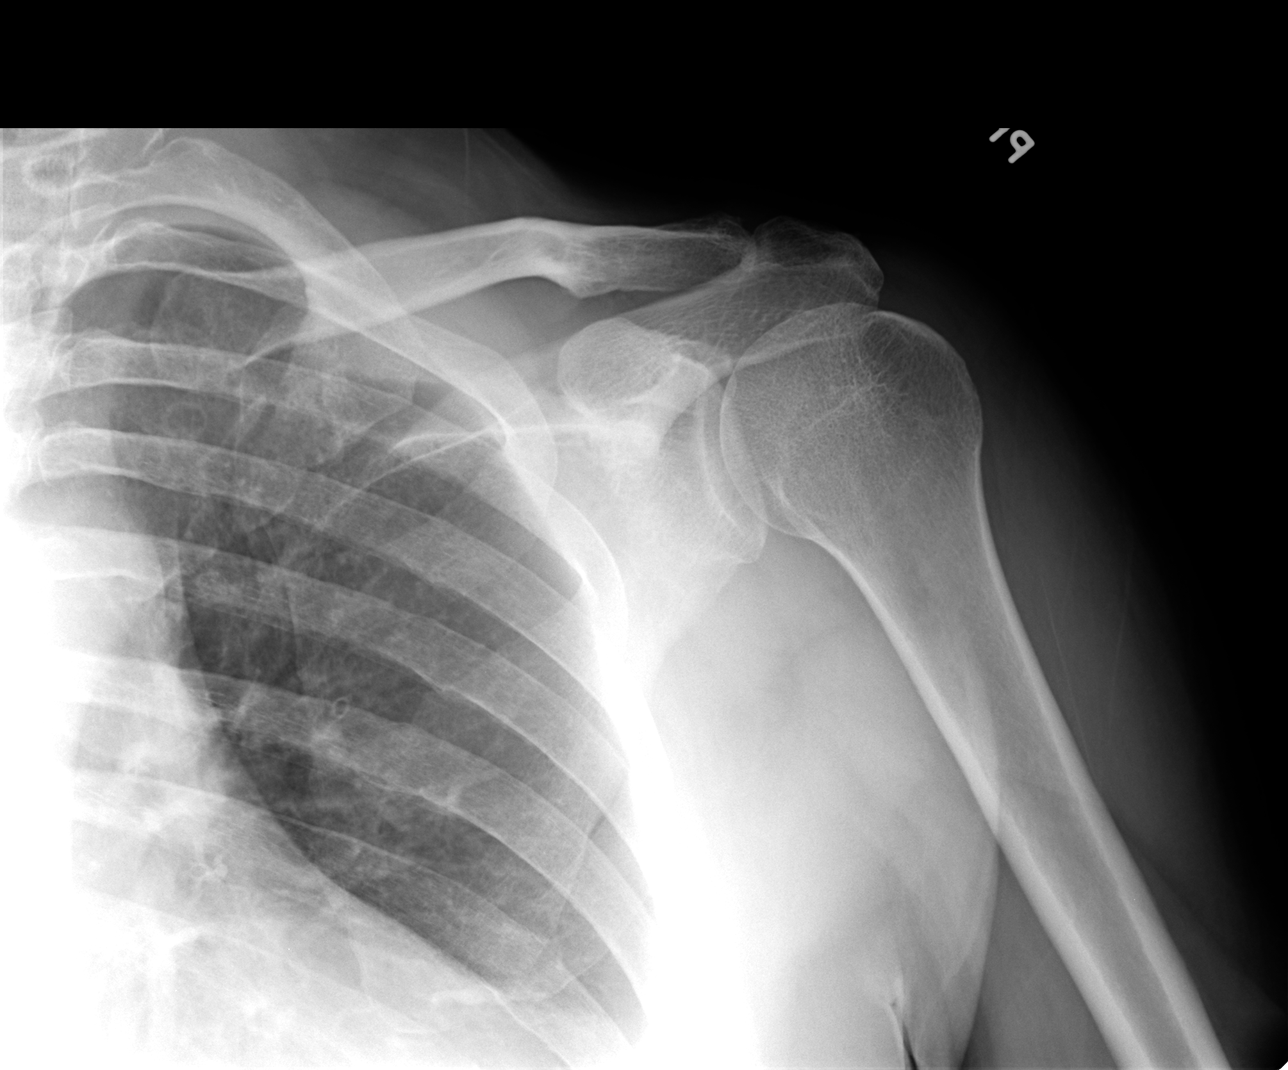

[view not recorded (2 of 3)]
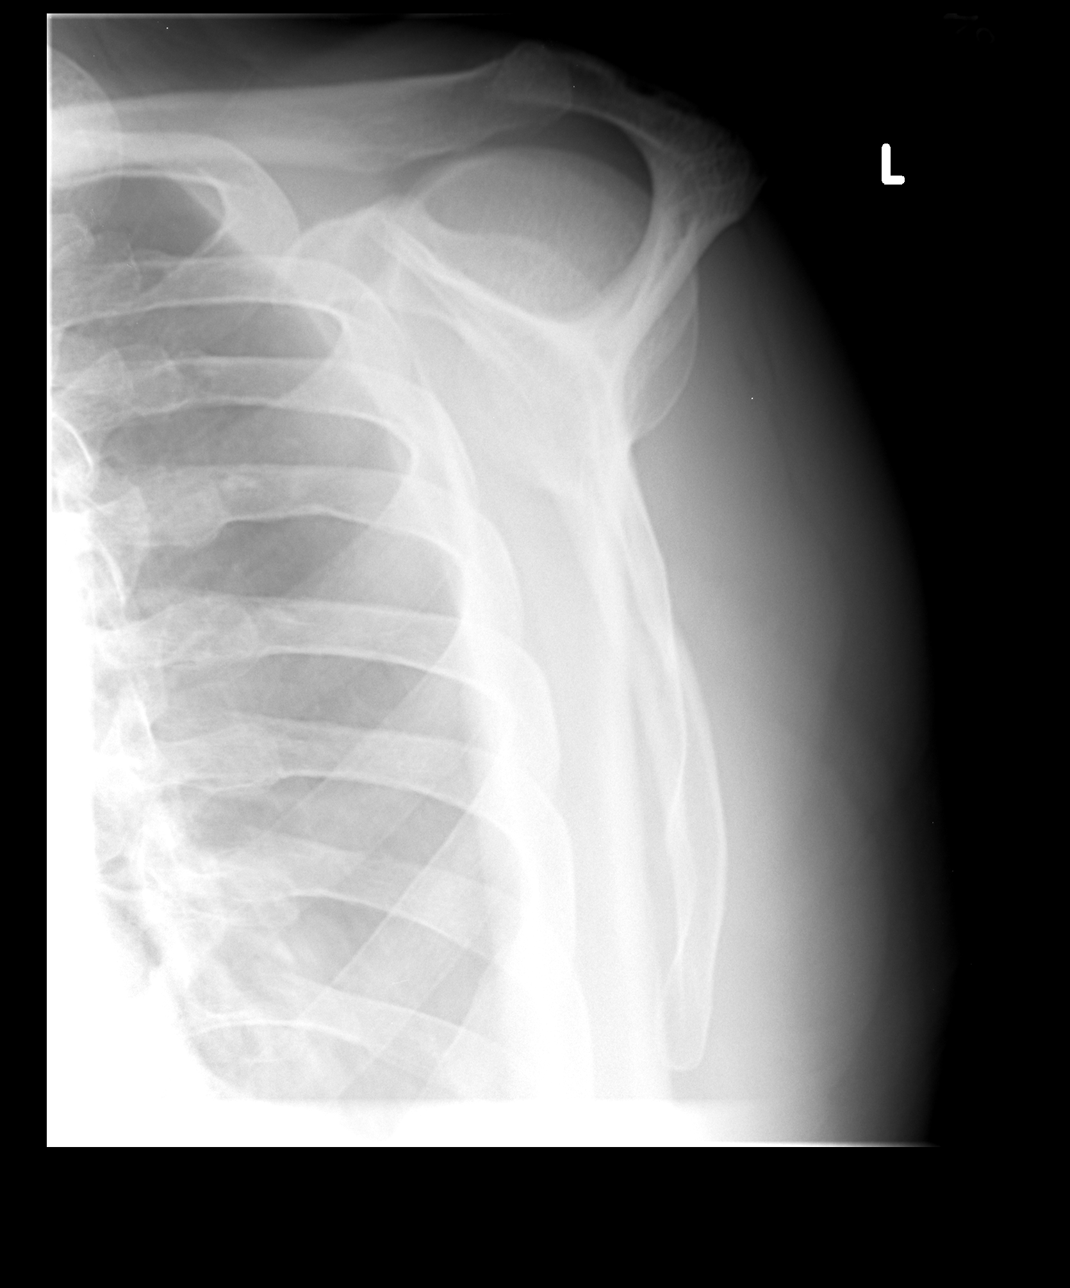

[view not recorded (3 of 3)]
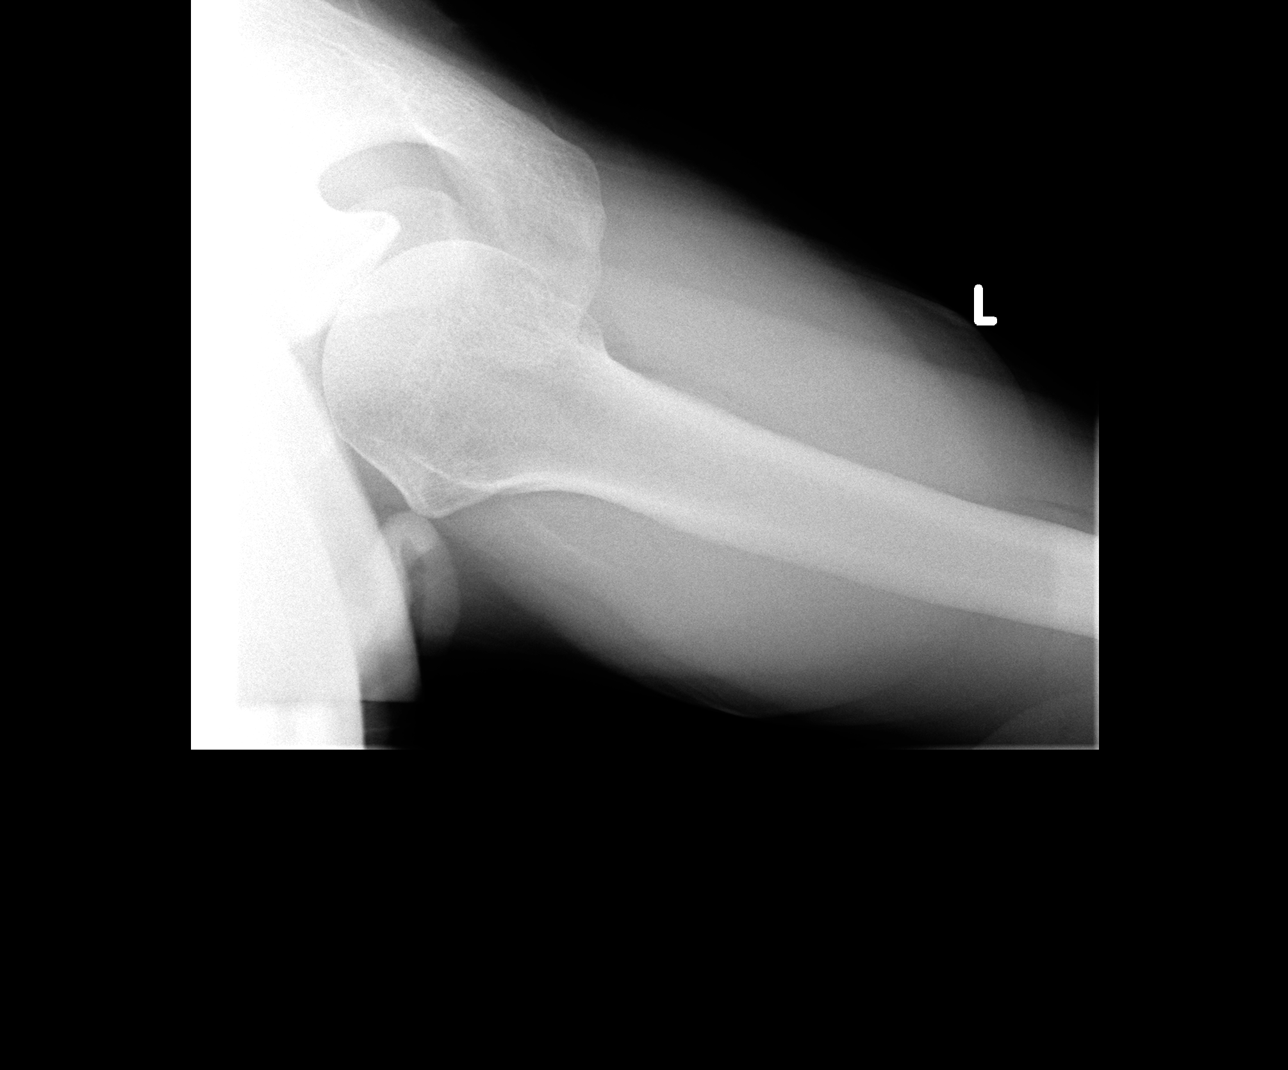

[3 of 3 positions shown; findings below may reference images not displayed]

FINDINGS: No glenohumeral joint dislocation. Bone mineralization is within
normal limits. Proximal left humerus appears intact. Left clavicle
and scapula appear intact. Visualized lung parenchyma within normal
limits. Evidence of chronic/healed left posterior lateral fifth
through seventh rib fractures. Partially visible cervical ACDF
hardware.
IMPRESSION: No acute osseous abnormality identified about the left shoulder.

## 2015-08-24 ENCOUNTER — Encounter: Payer: Self-pay | Admitting: Cardiology

## 2015-09-10 ENCOUNTER — Other Ambulatory Visit: Payer: Self-pay | Admitting: Family Medicine

## 2015-09-26 ENCOUNTER — Other Ambulatory Visit (INDEPENDENT_AMBULATORY_CARE_PROVIDER_SITE_OTHER): Payer: BLUE CROSS/BLUE SHIELD

## 2015-09-26 ENCOUNTER — Other Ambulatory Visit: Payer: BLUE CROSS/BLUE SHIELD

## 2015-09-26 DIAGNOSIS — E785 Hyperlipidemia, unspecified: Secondary | ICD-10-CM

## 2015-09-26 LAB — LIPID PANEL
Cholesterol: 200 mg/dL (ref 0–200)
HDL: 39.9 mg/dL (ref >39.00)
NONHDL: 160.13
Total CHOL/HDL Ratio: 5
Triglycerides: 224 mg/dL — ABNORMAL HIGH (ref 0.0–149.0)
VLDL: 44.8 mg/dL — ABNORMAL HIGH (ref 0.0–40.0)

## 2015-09-26 LAB — HEPATIC FUNCTION PANEL
ALK PHOS: 61 U/L (ref 39–117)
ALT: 43 U/L (ref 0–53)
AST: 25 U/L (ref 0–37)
Albumin: 4.5 g/dL (ref 3.5–5.2)
Bilirubin, Direct: 0.1 mg/dL (ref 0.0–0.3)
Total Bilirubin: 0.8 mg/dL (ref 0.2–1.2)
Total Protein: 6.8 g/dL (ref 6.0–8.3)

## 2015-09-26 LAB — LDL CHOLESTEROL, DIRECT: Direct LDL: 124 mg/dL

## 2015-09-27 ENCOUNTER — Other Ambulatory Visit: Payer: Self-pay

## 2015-10-04 ENCOUNTER — Encounter: Payer: Self-pay | Admitting: Family Medicine

## 2015-10-09 ENCOUNTER — Ambulatory Visit (INDEPENDENT_AMBULATORY_CARE_PROVIDER_SITE_OTHER): Payer: BLUE CROSS/BLUE SHIELD | Admitting: Family Medicine

## 2015-10-09 ENCOUNTER — Encounter: Payer: Self-pay | Admitting: Family Medicine

## 2015-10-09 VITALS — BP 110/90 | HR 74 | Temp 98.3°F | Ht 70.0 in | Wt 197.4 lb

## 2015-10-09 DIAGNOSIS — Z0001 Encounter for general adult medical examination with abnormal findings: Secondary | ICD-10-CM | POA: Diagnosis not present

## 2015-10-09 DIAGNOSIS — Z23 Encounter for immunization: Secondary | ICD-10-CM

## 2015-10-09 DIAGNOSIS — N400 Enlarged prostate without lower urinary tract symptoms: Secondary | ICD-10-CM

## 2015-10-09 DIAGNOSIS — Z0000 Encounter for general adult medical examination without abnormal findings: Secondary | ICD-10-CM | POA: Diagnosis not present

## 2015-10-09 DIAGNOSIS — M19072 Primary osteoarthritis, left ankle and foot: Secondary | ICD-10-CM | POA: Diagnosis not present

## 2015-10-09 LAB — CBC WITH DIFFERENTIAL/PLATELET
Basophils Absolute: 0 10*3/uL (ref 0.0–0.1)
Basophils Relative: 0.5 % (ref 0.0–3.0)
EOS PCT: 3.7 % (ref 0.0–5.0)
Eosinophils Absolute: 0.2 10*3/uL (ref 0.0–0.7)
HCT: 48.2 % (ref 39.0–52.0)
Hemoglobin: 16.5 g/dL (ref 13.0–17.0)
Lymphocytes Relative: 25.2 % (ref 12.0–46.0)
Lymphs Abs: 1.1 10*3/uL (ref 0.7–4.0)
MCHC: 34.3 g/dL (ref 30.0–36.0)
MCV: 91.4 fl (ref 78.0–100.0)
Monocytes Absolute: 0.6 10*3/uL (ref 0.1–1.0)
Monocytes Relative: 15 % — ABNORMAL HIGH (ref 3.0–12.0)
Neutro Abs: 2.4 10*3/uL (ref 1.4–7.7)
Neutrophils Relative %: 55.6 % (ref 43.0–77.0)
Platelets: 223 10*3/uL (ref 150.0–400.0)
RBC: 5.27 Mil/uL (ref 4.22–5.81)
RDW: 13 % (ref 11.5–15.5)
WBC: 4.3 10*3/uL (ref 4.0–10.5)

## 2015-10-09 LAB — BASIC METABOLIC PANEL
BUN: 19 mg/dL (ref 6–23)
CO2: 32 mEq/L (ref 19–32)
Calcium: 10.3 mg/dL (ref 8.4–10.5)
Chloride: 99 mEq/L (ref 96–112)
Creatinine, Ser: 0.98 mg/dL (ref 0.40–1.50)
GFR: 83.99 mL/min (ref >60.00)
Glucose, Bld: 108 mg/dL — ABNORMAL HIGH (ref 70–99)
POTASSIUM: 4.5 meq/L (ref 3.5–5.1)
SODIUM: 138 meq/L (ref 135–145)

## 2015-10-09 LAB — PSA: PSA: 4.12 ng/mL — AB (ref 0.10–4.00)

## 2015-10-09 LAB — TSH: TSH: 1.07 u[IU]/mL (ref 0.35–4.50)

## 2015-10-09 MED ORDER — TAMSULOSIN HCL 0.4 MG PO CAPS: 0.4000 mg | ORAL_CAPSULE | Freq: Every day | ORAL | Status: DC

## 2015-10-09 MED ORDER — ATORVASTATIN CALCIUM 80 MG PO TABS
ORAL_TABLET | ORAL | Status: DC
Start: 2015-10-09 — End: 2016-10-13

## 2015-10-09 MED ORDER — CHLORTHALIDONE 25 MG PO TABS: 25.0000 mg | ORAL_TABLET | Freq: Every day | ORAL | Status: DC

## 2015-10-09 MED ORDER — LISINOPRIL 40 MG PO TABS: ORAL_TABLET | ORAL | Status: DC

## 2015-10-09 NOTE — Patient Instructions (Signed)
Benign Prostatic Hypertrophy The prostate gland is part of the reproductive system of men. A normal prostate is about the size and shape of a walnut. The prostate gland produces a fluid that is mixed with sperm to make semen. This gland surrounds the urethra and is located in front of the rectum and just below the bladder. The bladder is where urine is stored. The urethra is the tube through which urine passes from the bladder to get out of the body. The prostate grows as a man ages. An enlarged prostate not caused by cancer is called benign prostatic hypertrophy (BPH). An enlarged prostate can press on the urethra. This can make it harder to pass urine. In the early stages of enlargement, the bladder can get by with a narrowed urethra by forcing the urine through. If the problem gets worse, medical or surgical treatment may be required.  This condition should be followed by your health care provider. The accumulation of urine in the bladder can cause infection. Back pressure and infection can progress to bladder damage and kidney (renal) failure. If needed, your health care provider may refer you to a specialist in kidney and prostate disease (urologist). CAUSES  BPH is a common health problem in men older than 50 years. This condition is a normal part of aging. However, not all men will develop problems from this condition. If the enlargement grows away from the urethra, then there will not be any compression of the urethra and resistance to urine flow.If the growth is toward the urethra and compresses it, you will experience difficulty urinating.  SYMPTOMS   Not able to completely empty your bladder.  Getting up often during the night to urinate.  Need to urinate frequently during the day.  Difficultly starting urine flow.  Decrease in size and strength of your urine stream.  Dribbling after urination.  Pain on urination (more common with infection).  Inability to pass urine. This needs  immediate treatment.  The development of a urinary tract infection. DIAGNOSIS  These tests will help your health care provider understand your problem:  A thorough history and physical examination.  A urination history, with the number of times you urinate, the amounts of urine, the strength of the urine stream, and the feeling of emptiness or fullness after urinating.  A postvoid bladder scan that measures any amount of urine that may remain in your bladder after you finish urinating.  Digital rectal exam. In a rectal exam, your health care provider checks your prostate by putting a gloved, lubricated finger into your rectum to feel the back of your prostate gland. This exam detects the size of your gland and abnormal lumps or growths.  Exam of your urine (urinalysis).  Prostate specific antigen (PSA) screening. This is a blood test used to screen for prostate cancer.  Rectal ultrasonography. This test uses sound waves to electronically produce a picture of your prostate gland. TREATMENT  Once symptoms begin, your health care provider will monitor your condition. Of the men with this condition, one third will have symptoms that stabilize, one third will have symptoms that improve, and one third will have symptoms that progress in the first year. Mild symptoms may not need treatment. Simple observation and yearly exams may be all that is required. Medicines and surgery are options for more severe problems. Your health care provider can help you make an informed decision for what is best. Two classes of medicines are available for relief of prostate symptoms:  Medicines   that shrink the prostate. This helps relieve symptoms. These medicines take time to work, and it may be months before any improvement is seen.  Uncommon side effects include problems with sexual function.  Medicines to relax the muscle of the prostate. This also relieves the obstruction by reducing any compression on the  urethra.This group of medicines work much faster than those that reduce the size of the prostate gland. Usually, one can experience improvement in days to weeks..  Side effects can include dizziness, fatigue, lightheadedness, and retrograde ejaculation (diminished volume of ejaculate). Several types of surgical treatments are available for relief of prostate symptoms:  Transurethral resection of the prostate (TURP)--In this treatment, an instrument is inserted through opening at the tip of the penis. It is used to cut away pieces of the inner core of the prostate. The pieces are removed through the same opening of the penis. This removes the obstruction and helps get rid of the symptoms.  Transurethral incision (TUIP)--In this procedure, small cuts are made in the prostate. This lessens the prostates pressure on the urethra.  Transurethral microwave thermotherapy (TUMT)--This procedure uses microwaves to create heat. The heat destroys and removes a small amount of prostate tissue.  Transurethral needle ablation (TUNA)--This is a procedure that uses radio frequencies to do the same as TUMT.  Interstitial laser coagulation (ILC)--This is a procedure that uses a laser to do the same as TUMT and TUNA.  Transurethral electrovaporization (TUVP)--This is a procedure that uses electrodes to do the same as the procedures listed above. SEEK MEDICAL CARE IF:   You develop a fever.  There is unexplained back pain.  Symptoms are not helped by medicines prescribed.  You develop side effects from the medicine you are taking.  Your urine becomes very dark or has a bad smell.  Your lower abdomen becomes distended and you have difficulty passing your urine. SEEK IMMEDIATE MEDICAL CARE IF:   You are suddenly unable to urinate. This is an emergency. You should be seen immediately.  There are large amounts of blood or clots in the urine.  Your urinary problems become unmanageable.  You develop  lightheadedness, severe dizziness, or you feel faint.  You develop moderate to severe low back or flank pain.  You develop chills or fever.   This information is not intended to replace advice given to you by your health care provider. Make sure you discuss any questions you have with your health care provider.   Document Released: 08/19/2005 Document Revised: 08/24/2013 Document Reviewed: 03/04/2013 Elsevier Interactive Patient Education 2016 [employer]vier Inc.  

## 2015-10-09 NOTE — Progress Notes (Signed)
Pre visit review using our clinic review tool, if applicable. No additional management support is needed unless otherwise documented below in the visit note. 

## 2015-10-09 NOTE — Progress Notes (Signed)
Subjective:    Patient ID: Jesse Pacheco, male    DOB: 08/18/1961, 55 y.o.   MRN: [id number]  HPI  Patient seen for complete physical. chronic problems include history of CVA , elevated PSA, hypertension, hyperlipidemia from osteoarthritis. His elevated PSA has been followed by urology in past but not for about one year.. He had ultrasound but no biopsy. He had some initial high levels couple years ago but these subsequently came down. He does describe some nocturia recently and feels he has slow urinary stream and incomplete emptying at times. No burning with urination.   Progressive left foot pain MTP joint. X-rays 2 years ago showed osteoarthritis changes. No history of reported injury. Progressively worsening pain with ambulation. Frequent stiffness. He has no history of gout and has not noted any redness or warmth.   Patient is nonsmoker. Colonoscopy up-to-date. Still needs flu vaccine  Past Medical History  Diagnosis Date  . HYPERTENSION 01/09/2009  . VIRAL URI 03/22/2010  . 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 Nocturia 01/09/2009  . Depression    Past Surgical History  Procedure Laterality Date  . Appendectomy    . Spine surgery  2006, 2009    laminectomy  . Loop recorder implant N/A 11/29/2014    Procedure: LOOP RECORDER IMPLANT;  Surgeon: Hillis Range, MD;  Location: [facility] CATH LAB;  Service: Cardiovascular;  Laterality: N/A;  . Tee without cardioversion N/A 11/29/2014    Procedure: TRANSESOPHAGEAL ECHOCARDIOGRAM (TEE);  Surgeon: Jake Bathe, MD;  Location: [facility] ENDOSCOPY;  Service: Cardiovascular;  Laterality: N/A;    reports that he has never smoked. He does not have any smokeless tobacco history on file. He reports that he drinks alcohol. He reports that he does not use illicit drugs. family history includes Heart disease (age of onset: 55) in his father; Hypertension in his mother; Stroke in his mother. No Known Allergies   Review of Systems  Constitutional:  Negative for fever, activity change, appetite change, fatigue and unexpected weight change.  HENT: Negative for congestion, ear pain and trouble swallowing.   Eyes: Negative for pain and visual disturbance.  Respiratory: Negative for cough, shortness of breath and wheezing.   Cardiovascular: Negative for chest pain and palpitations.  Gastrointestinal: Negative for nausea, vomiting, abdominal pain, diarrhea, constipation, blood in stool, abdominal distention and rectal pain.  Genitourinary: Negative for dysuria, hematuria and testicular pain.  Musculoskeletal: Positive for arthralgias. Negative for joint swelling.  Skin: Negative for rash.  Neurological: Negative for dizziness, syncope and headaches.  Hematological: Negative for adenopathy.  Psychiatric/Behavioral: Negative for confusion and dysphoric mood.       Objective:   Physical Exam  Constitutional: He is oriented to person, place, and time. He appears well-developed and well-nourished. No distress.  HENT:  Head: Normocephalic and atraumatic.  Right Ear: External ear normal.  Left Ear: External ear normal.  Mouth/Throat: Oropharynx is clear and moist.  Eyes: Conjunctivae and EOM are normal. Pupils are equal, round, and reactive to light.  Neck: Normal range of motion. Neck supple. No thyromegaly present.  Cardiovascular: Normal rate, regular rhythm and normal heart sounds.   No murmur heard. Pulmonary/Chest: No respiratory distress. He has no wheezes. He has no rales.  Abdominal: Soft. Bowel sounds are normal. He exhibits no distension and no mass. There is no tenderness. There is no rebound and no guarding.  Genitourinary:  Prostate is moderately enlarged. No nodules. Nontender. No rectal mass  Musculoskeletal: He exhibits no edema.  Lymphadenopathy:  He has no cervical adenopathy.  Neurological: He is alert and oriented to person, place, and time. He displays normal reflexes. No cranial nerve deficit.  Skin: No rash  noted.  Psychiatric: He has a normal mood and affect.          Assessment & Plan:  Physical exam. For some reason, only lipid panel and hepatic panel were done. We'll obtain other screening labs including PSA. His lipids were slightly elevated but he was not taking Lipitor at the time but these were done. We've refilled his Lipitor for one year along with his blood pressure medication. Flu vaccine given. Colonoscopy up-to-date  Left foot pain-metatarsophalangeal joint with known osteoarthritis. Progressively painful. Set up orthopedic referral  BPH. Patient has had some recent increased symptomatology. Start Flomax 0.4 mg daily at bedtime. Touch base in 2-3 weeks if not seeing symptomatic improvement. We discussed long-term may need to be on medication such as finasteride or Avodart but will start with Flomax

## 2015-11-17 ENCOUNTER — Telehealth: Payer: Self-pay | Admitting: Cardiology

## 2015-11-17 NOTE — Telephone Encounter (Signed)
LMOVM for pt return call in regards to home monitor.   

## 2015-11-28 ENCOUNTER — Encounter: Payer: Self-pay | Admitting: Cardiology

## 2015-12-12 ENCOUNTER — Encounter: Payer: Self-pay | Admitting: Cardiology

## 2016-01-08 ENCOUNTER — Telehealth: Payer: Self-pay | Admitting: Internal Medicine

## 2016-01-08 NOTE — Telephone Encounter (Signed)
Patient states that he wants his ILR removed bc it is now causing him some discomfort.   Patient has already been removed from carelink due to delinquent follow up.   Plan to have patient scheduled for an appt with Dr.Allred first available to discuss ILR explant. [facility]

## 2016-01-08 NOTE — Telephone Encounter (Signed)
New Message  Pt calling to discuss possibly removing loop recorder. Please call back and discuss.

## 2016-02-05 ENCOUNTER — Encounter: Payer: BLUE CROSS/BLUE SHIELD | Admitting: Internal Medicine

## 2016-03-13 ENCOUNTER — Encounter: Payer: BLUE CROSS/BLUE SHIELD | Admitting: Internal Medicine

## 2016-03-21 ENCOUNTER — Other Ambulatory Visit: Payer: Self-pay | Admitting: General Practice

## 2016-03-21 MED ORDER — TAMSULOSIN HCL 0.4 MG PO CAPS: 0.4000 mg | ORAL_CAPSULE | Freq: Every day | ORAL | Status: DC

## 2016-05-15 ENCOUNTER — Ambulatory Visit (INDEPENDENT_AMBULATORY_CARE_PROVIDER_SITE_OTHER): Payer: BLUE CROSS/BLUE SHIELD | Admitting: Family Medicine

## 2016-05-15 VITALS — BP 120/68 | HR 93 | Ht 70.0 in | Wt 198.4 lb

## 2016-05-15 DIAGNOSIS — N400 Enlarged prostate without lower urinary tract symptoms: Secondary | ICD-10-CM

## 2016-05-15 DIAGNOSIS — E785 Hyperlipidemia, unspecified: Secondary | ICD-10-CM

## 2016-05-15 DIAGNOSIS — I10 Essential (primary) hypertension: Secondary | ICD-10-CM

## 2016-05-15 DIAGNOSIS — R351 Nocturia: Secondary | ICD-10-CM | POA: Diagnosis not present

## 2016-05-15 DIAGNOSIS — G4700 Insomnia, unspecified: Secondary | ICD-10-CM

## 2016-05-15 MED ORDER — TRAZODONE HCL 50 MG PO TABS: 50.0000 mg | ORAL_TABLET | Freq: Every day | ORAL | 5 refills | Status: DC

## 2016-05-15 MED ORDER — FINASTERIDE 5 MG PO TABS: 5.0000 mg | ORAL_TABLET | Freq: Every day | ORAL | 5 refills | Status: DC

## 2016-05-15 NOTE — Progress Notes (Signed)
Subjective:     Patient ID: Jesse Pacheco, male   DOB: 1961/06/22, 55 y.o.   MRN: [id number]  HPI Patient had a stroke over year ago. He states since that time has had difficulty with sleep and also sometimes has difficult defining the right word. He has not had any slurred speech or any focal weakness.  Insomnia has been a chronic issue. He usually goes to bed around 10 or 11 PM frequently does not fall sleep until around midnight and usually wakes up around 3 AM and cannot get back to sleep. He's tried Tylenol PM without relief. He does not drink alcohol regularly. No late day use of caffeine. He does sometimes get up frequently to urinate. He has known history of BPH and has had chronic elevated PSAs and then followed by urology in the past. Denies depression symptoms  He takes Flomax regularly but even with that sometimes has slow stream. No recent burning with urination.  He is taking his Lipitor as well as aspirin and is also on lisinopril and chlorthalidone. He takes chlorthalidone in the morning.  Past Medical History:  Diagnosis Date  . Depression   . HYPERTENSION 01/09/2009  . 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 Nocturia 01/09/2009  . VIRAL URI 03/22/2010   Past Surgical History:  Procedure Laterality Date  . APPENDECTOMY    . LOOP RECORDER IMPLANT N/A 11/29/2014   Procedure: LOOP RECORDER IMPLANT;  Surgeon: Hillis Range, MD;  Location: [facility] CATH LAB;  Service: Cardiovascular;  Laterality: N/A;  . SPINE SURGERY  2006, 2009   laminectomy  . TEE WITHOUT CARDIOVERSION N/A 11/29/2014   Procedure: TRANSESOPHAGEAL ECHOCARDIOGRAM (TEE);  Surgeon: Jake Bathe, MD;  Location: [facility] ENDOSCOPY;  Service: Cardiovascular;  Laterality: N/A;    reports that he has never smoked. He does not have any smokeless tobacco history on file. He reports that he drinks alcohol. He reports that he does not use drugs. family history includes Heart disease (age of onset: 1) in his father; Hypertension in  his mother; Stroke in his mother. No Known Allergies   Review of Systems  Constitutional: Negative for appetite change and unexpected weight change.  Respiratory: Negative for shortness of breath.   Cardiovascular: Negative for chest pain, palpitations and leg swelling.  Genitourinary: Positive for decreased urine volume, difficulty urinating and frequency.  Neurological: Negative for headaches.  Psychiatric/Behavioral: Positive for sleep disturbance. Negative for agitation and dysphoric mood.       Objective:   Physical Exam  Constitutional: He is oriented to person, place, and time. He appears well-developed and well-nourished.  Neck: Neck supple.  Cardiovascular: Normal rate and regular rhythm.   Pulmonary/Chest: Effort normal and breath sounds normal. No respiratory distress. He has no wheezes. He has no rales.  Musculoskeletal: He exhibits no edema.  Lymphadenopathy:    He has no cervical adenopathy.  Neurological: He is alert and oriented to person, place, and time. No cranial nerve deficit. Coordination normal.  Psychiatric: He has a normal mood and affect. His behavior is normal. Judgment and thought content normal.       Assessment:     #1 past history of CVA  #2 history of BPH with ongoing symptoms on Flomax  #3 chronic insomnia  #4 hyperlipidemia  #5 hypertension stable and well controlled    Plan:     -Sleep hygiene discussed with handout given. Continue to avoid alcohol and caffeine at night -Consider trial of trazodone 50 mg daily at bedtime -Continue Flomax  and add finasteride 5 mg daily at bedtime -Reassess in 2 months  Kristian CoveyBruce W Shamela Haydon MD [facility]

## 2016-05-15 NOTE — Progress Notes (Signed)
Pre visit review using our clinic review tool, if applicable. No additional management support is needed unless otherwise documented below in the visit note. 

## 2016-05-15 NOTE — Patient Instructions (Signed)
Insomnia Insomnia is a sleep disorder that makes it difficult to fall asleep or to stay asleep. Insomnia can cause tiredness (fatigue), low energy, difficulty concentrating, mood swings, and poor performance at work or school.  There are three different ways to classify insomnia:  Difficulty falling asleep.  Difficulty staying asleep.  Waking up too early in the morning. Any type of insomnia can be long-term (chronic) or short-term (acute). Both are common. Short-term insomnia usually lasts for three months or less. Chronic insomnia occurs at least three times a week for longer than three months. CAUSES  Insomnia may be caused by another condition, situation, or substance, such as:  Anxiety.  Certain medicines.  Gastroesophageal reflux disease (GERD) or other gastrointestinal conditions.  Asthma or other breathing conditions.  Restless legs syndrome, sleep apnea, or other sleep disorders.  Chronic pain.  Menopause. This may include hot flashes.  Stroke.  Abuse of alcohol, tobacco, or illegal drugs.  Depression.  Caffeine.   Neurological disorders, such as Alzheimer disease.  An overactive thyroid (hyperthyroidism). The cause of insomnia may not be known. RISK FACTORS Risk factors for insomnia include:  Gender. Women are more commonly affected than men.  Age. Insomnia is more common as you get older.  Stress. This may involve your professional or personal life.  Income. Insomnia is more common in people with lower income.  Lack of exercise.   Irregular work schedule or night shifts.  Traveling between different time zones. SIGNS AND SYMPTOMS If you have insomnia, trouble falling asleep or trouble staying asleep is the main symptom. This may lead to other symptoms, such as:  Feeling fatigued.  Feeling nervous about going to sleep.  Not feeling rested in the morning.  Having trouble concentrating.  Feeling irritable, anxious, or depressed. TREATMENT   Treatment for insomnia depends on the cause. If your insomnia is caused by an underlying condition, treatment will focus on addressing the condition. Treatment may also include:   Medicines to help you sleep.  Counseling or therapy.  Lifestyle adjustments. HOME CARE INSTRUCTIONS   Take medicines only as directed by your health care provider.  Keep regular sleeping and waking hours. Avoid naps.  Keep a sleep diary to help you and your health care provider figure out what could be causing your insomnia. Include:   When you sleep.  When you wake up during the night.  How well you sleep.   How rested you feel the next day.  Any side effects of medicines you are taking.  What you eat and drink.   Make your bedroom a comfortable place where it is easy to fall asleep:  Put up shades or special blackout curtains to block light from outside.  Use a white noise machine to block noise.  Keep the temperature cool.   Exercise regularly as directed by your health care provider. Avoid exercising right before bedtime.  Use relaxation techniques to manage stress. Ask your health care provider to suggest some techniques that may work well for you. These may include:  Breathing exercises.  Routines to release muscle tension.  Visualizing peaceful scenes.  Cut back on alcohol, caffeinated beverages, and cigarettes, especially close to bedtime. These can disrupt your sleep.  Do not overeat or eat spicy foods right before bedtime. This can lead to digestive discomfort that can make it hard for you to sleep.  Limit screen use before bedtime. This includes:  Watching TV.  Using your smartphone, tablet, and computer.  Stick to a routine. This   can help you fall asleep faster. Try to do a quiet activity, brush your teeth, and go to bed at the same time each night.  Get out of bed if you are still awake after 15 minutes of trying to sleep. Keep the lights down, but try reading or  doing a quiet activity. When you feel sleepy, go back to bed.  Make sure that you drive carefully. Avoid driving if you feel very sleepy.  Keep all follow-up appointments as directed by your health care provider. This is important. SEEK MEDICAL CARE IF:   You are tired throughout the day or have trouble in your daily routine due to sleepiness.  You continue to have sleep problems or your sleep problems get worse. SEEK IMMEDIATE MEDICAL CARE IF:   You have serious thoughts about hurting yourself or someone else.   This information is not intended to replace advice given to you by your health care provider. Make sure you discuss any questions you have with your health care provider.   Document Released: 08/16/2000 Document Revised: 05/10/2015 Document Reviewed: 05/20/2014 Elsevier Interactive Patient Education 2016 [employer] Inc.  

## 2016-06-18 ENCOUNTER — Encounter: Payer: Self-pay | Admitting: Family Medicine

## 2016-06-18 ENCOUNTER — Ambulatory Visit (INDEPENDENT_AMBULATORY_CARE_PROVIDER_SITE_OTHER): Payer: BLUE CROSS/BLUE SHIELD | Admitting: Family Medicine

## 2016-06-18 VITALS — BP 110/80 | HR 102 | Temp 98.6°F | Ht 70.0 in | Wt 197.0 lb

## 2016-06-18 DIAGNOSIS — I959 Hypotension, unspecified: Secondary | ICD-10-CM

## 2016-06-18 DIAGNOSIS — Z8673 Personal history of transient ischemic attack (TIA), and cerebral infarction without residual deficits: Secondary | ICD-10-CM

## 2016-06-18 DIAGNOSIS — R5383 Other fatigue: Secondary | ICD-10-CM

## 2016-06-18 DIAGNOSIS — Z23 Encounter for immunization: Secondary | ICD-10-CM

## 2016-06-18 NOTE — Patient Instructions (Signed)
Go ahead and reduce the chlorthalidone to one half tablet per day.

## 2016-06-18 NOTE — Progress Notes (Signed)
Subjective:     Patient ID: Jesse Pacheco, male   DOB: 06-10-1961, 55 y.o.   MRN: [id number]  HPI Patient seen with increased fatigue issues. He had acute ischemic stroke last year and states he felt somewhat fatigued ever since then. He has other medical problems including hypertension, BPH, hyperlipidemia.  He had some right-sided weakness with a stroke and feels he has never fully recovered. He generally gets about 6-7 hour sleep at night. His appetite is okay and weight is stable. Denies any fevers or chills. Occasional lightheadedness with standing. His current medications include aspirin, Lipitor, chlorthalidone, Proscar, Prinivil, Flomax, and trazodone. He states he was having fatigue well before starting the finasteride. He had thyroid functions several months ago which were normal.  He does have some depressed mood. Denies any suicidal ideation. His sleep is improved after starting trazodone  Past Medical History:  Diagnosis Date  . Depression   . HYPERTENSION 01/09/2009  . 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 Nocturia 01/09/2009  . VIRAL URI 03/22/2010   Past Surgical History:  Procedure Laterality Date  . APPENDECTOMY    . LOOP RECORDER IMPLANT N/A 11/29/2014   Procedure: LOOP RECORDER IMPLANT;  Surgeon: Hillis RangeJames Allred, MD;  [facility];  Service: Cardiovascular;  Laterality: N/A;  . SPINE SURGERY  2006, 2009   laminectomy  . TEE WITHOUT CARDIOVERSION N/A 11/29/2014   Procedure: TRANSESOPHAGEAL ECHOCARDIOGRAM (TEE);  Surgeon: Jake BatheMark C Skains, MD;  [facility];  Service: Cardiovascular;  Laterality: N/A;    reports that he has never smoked. He does not have any smokeless tobacco history on file. He reports that he drinks alcohol. He reports that he does not use drugs. family history includes Heart disease (age of onset: 5058) in his father; Hypertension in his mother; Stroke in his mother. No Known Allergies    Review of Systems  Constitutional: Positive  for fatigue. Negative for appetite change, chills, fever and unexpected weight change.  HENT: Negative for sore throat and trouble swallowing.   Respiratory: Negative for cough and shortness of breath.   Cardiovascular: Negative for chest pain, palpitations and leg swelling.  Gastrointestinal: Negative for abdominal pain.  Genitourinary: Negative for dysuria.  Neurological: Negative for dizziness and weakness.  Psychiatric/Behavioral: Positive for dysphoric mood. Negative for suicidal ideas.       Objective:   Physical Exam  Constitutional: He is oriented to person, place, and time. He appears well-developed and well-nourished. No distress.  HENT:  Mouth/Throat: Oropharynx is clear and moist.  Neck: Neck supple. No thyromegaly present.  Cardiovascular: Normal rate and regular rhythm.   Pulmonary/Chest: Effort normal and breath sounds normal. No respiratory distress. He has no wheezes. He has no rales.  Abdominal: Soft. He exhibits no mass. There is no tenderness. There is no rebound and no guarding.  Musculoskeletal: He exhibits no edema.  Lymphadenopathy:    He has no cervical adenopathy.  Neurological: He is alert and oriented to person, place, and time. No cranial nerve deficit.  Psychiatric:  PHQ-9  Score of 17       Assessment:     #1 fatigue. Likely multifactorial. He states he has felt somewhat down and fatigued ever since his stroke. Poor sleep, depressed mood,medications may be contributing.  #2 low blood pressure. He has not had any true orthostatic drop but did have blood pressure 110/80 seated and standing 98/68  #3 history of CVA  #4 hypertension very well controlled as above  #5 BPH  #6  depressed mood with pH Q9 score 17    Plan:     -Check labs with basic metabolic panel, CBC, and total testosterone -Reduce chlorthalidone 25 mg to one half tablet daily -Consider trial of antidepressant medication-but will wait on labs above first. -He has follow-up  already scheduled for middle of November.  Kristian Covey MD [facility] at [facility]

## 2016-06-28 ENCOUNTER — Other Ambulatory Visit: Payer: BLUE CROSS/BLUE SHIELD

## 2016-07-01 ENCOUNTER — Other Ambulatory Visit (INDEPENDENT_AMBULATORY_CARE_PROVIDER_SITE_OTHER): Payer: BLUE CROSS/BLUE SHIELD

## 2016-07-01 DIAGNOSIS — R5383 Other fatigue: Secondary | ICD-10-CM | POA: Diagnosis not present

## 2016-07-01 LAB — CBC WITH DIFFERENTIAL/PLATELET
BASOS ABS: 0 10*3/uL (ref 0.0–0.1)
Basophils Relative: 0.5 % (ref 0.0–3.0)
EOS PCT: 2.8 % (ref 0.0–5.0)
Eosinophils Absolute: 0.2 10*3/uL (ref 0.0–0.7)
HCT: 44 % (ref 39.0–52.0)
Hemoglobin: 15.3 g/dL (ref 13.0–17.0)
LYMPHS ABS: 1.3 10*3/uL (ref 0.7–4.0)
Lymphocytes Relative: 24.1 % (ref 12.0–46.0)
MCHC: 34.8 g/dL (ref 30.0–36.0)
MCV: 90.3 fl (ref 78.0–100.0)
MONOS PCT: 9.7 % (ref 3.0–12.0)
Monocytes Absolute: 0.5 10*3/uL (ref 0.1–1.0)
NEUTROS ABS: 3.4 10*3/uL (ref 1.4–7.7)
NEUTROS PCT: 62.9 % (ref 43.0–77.0)
PLATELETS: 203 10*3/uL (ref 150.0–400.0)
RBC: 4.87 Mil/uL (ref 4.22–5.81)
RDW: 13.1 % (ref 11.5–15.5)
WBC: 5.5 10*3/uL (ref 4.0–10.5)

## 2016-07-01 LAB — BASIC METABOLIC PANEL
BUN: 16 mg/dL (ref 6–23)
CALCIUM: 9.5 mg/dL (ref 8.4–10.5)
CO2: 29 meq/L (ref 19–32)
Chloride: 101 mEq/L (ref 96–112)
Creatinine, Ser: 1.04 mg/dL (ref 0.40–1.50)
GFR: 78.22 mL/min (ref >60.00)
GLUCOSE: 149 mg/dL — AB (ref 70–99)
Potassium: 3.7 mEq/L (ref 3.5–5.1)
SODIUM: 140 meq/L (ref 135–145)

## 2016-07-01 LAB — TESTOSTERONE: Testosterone: 385.34 ng/dL (ref 300.00–890.00)

## 2016-07-15 ENCOUNTER — Ambulatory Visit: Payer: BLUE CROSS/BLUE SHIELD | Admitting: Family Medicine

## 2016-07-16 ENCOUNTER — Ambulatory Visit (INDEPENDENT_AMBULATORY_CARE_PROVIDER_SITE_OTHER): Payer: BLUE CROSS/BLUE SHIELD | Admitting: Family Medicine

## 2016-07-16 ENCOUNTER — Encounter: Payer: Self-pay | Admitting: Family Medicine

## 2016-07-16 VITALS — BP 124/72 | HR 88 | Temp 97.8°F | Ht 70.0 in | Wt 205.0 lb

## 2016-07-16 DIAGNOSIS — R5383 Other fatigue: Secondary | ICD-10-CM | POA: Diagnosis not present

## 2016-07-16 DIAGNOSIS — M79672 Pain in left foot: Secondary | ICD-10-CM | POA: Diagnosis not present

## 2016-07-16 DIAGNOSIS — M19072 Primary osteoarthritis, left ankle and foot: Secondary | ICD-10-CM | POA: Diagnosis not present

## 2016-07-16 DIAGNOSIS — F439 Reaction to severe stress, unspecified: Secondary | ICD-10-CM | POA: Diagnosis not present

## 2016-07-16 NOTE — Progress Notes (Signed)
Subjective:     Patient ID: Jesse Pacheco, male   DOB: 1961/05/02, 55 y.o.   MRN: [id number]  HPI Patient seen for several issues as follows  Recent fatigue issues. We obtained testosterone level, CBC, and basic chemistries and these were all basically normal. We reduced his chlorthalidone to half tablet and blood pressures have been stable. No orthostasis. He had pH Q-9 score of 17. He declined antidepressants. He feels a lot of his stress and fatigue are wrapped up together. He quit working as an Teacher, [employer] personnel couple years ago after some neck difficulties. Also had acute stroke. 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 He feels that the physical demands of his work is becoming more challenging over time.  He's had financial stressors which contribute to his overall stress levels. Poor sleep at times. He is using some trazodone which seems to help somewhat.  Progressive left foot pains. Previous x-rays have shown degenerative arthritis metatarsophalangeal joint. Requesting orthopedic referral.  Past Medical History:  Diagnosis Date  . Depression   . HYPERTENSION 01/09/2009  . 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 Nocturia 01/09/2009  . VIRAL URI 03/22/2010   Past Surgical History:  Procedure Laterality Date  . APPENDECTOMY    . LOOP RECORDER IMPLANT N/A 11/29/2014   Procedure: LOOP RECORDER IMPLANT;  Surgeon: Hillis RangeJames Allred, MD;  [facility];  Service: Cardiovascular;  Laterality: N/A;  . SPINE SURGERY  2006, 2009   laminectomy  . TEE WITHOUT CARDIOVERSION N/A 11/29/2014   Procedure: TRANSESOPHAGEAL ECHOCARDIOGRAM (TEE);  Surgeon: Jake BatheMark C Skains, MD;  [facility];  Service: Cardiovascular;  Laterality: N/A;    reports that he has never smoked. He does not have any smokeless tobacco history on file. He reports that he drinks alcohol. He reports that he does not use drugs. family history includes Heart disease (age of onset: 6758) in his father; Hypertension in  his mother; Stroke in his mother. No Known Allergies   Review of Systems  Constitutional: Positive for fatigue. Negative for appetite change and unexpected weight change.  Eyes: Negative for visual disturbance.  Respiratory: Negative for cough, chest tightness and shortness of breath.   Cardiovascular: Negative for chest pain, palpitations and leg swelling.  Neurological: Negative for dizziness, syncope, weakness, light-headedness and headaches.       Objective:   Physical Exam  Constitutional: He is oriented to person, place, and time. He appears well-developed and well-nourished.  HENT:  Right Ear: External ear normal.  Left Ear: External ear normal.  Mouth/Throat: Oropharynx is clear and moist.  Eyes: Pupils are equal, round, and reactive to light.  Neck: Neck supple. No thyromegaly present.  Cardiovascular: Normal rate and regular rhythm.   Pulmonary/Chest: Effort normal and breath sounds normal. No respiratory distress. He has no wheezes. He has no rales.  Musculoskeletal: He exhibits no edema.  Left foot very limited mobility metatarsophalangeal joint. No erythema. No warmth.  Neurological: He is alert and oriented to person, place, and time.       Assessment:     #1 fatigue. 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 Starting to impair ambulation more  #3 situational stress    Plan:     -Set up referral to orthopedist regarding his foot pain -Recommend counseling for his stress issues and he will consider -We discussed possible use of antidepressant such as Wellbutrin given his low energy and low motivation but he is reluctant at  this time.  Kristian CoveyBruce W Layan Zalenski MD [facility]

## 2016-10-08 ENCOUNTER — Encounter: Payer: Self-pay | Admitting: Family Medicine

## 2016-10-08 ENCOUNTER — Ambulatory Visit (INDEPENDENT_AMBULATORY_CARE_PROVIDER_SITE_OTHER): Payer: BLUE CROSS/BLUE SHIELD | Admitting: Family Medicine

## 2016-10-08 VITALS — BP 120/80 | HR 103 | Ht 70.0 in | Wt 208.5 lb

## 2016-10-08 DIAGNOSIS — F322 Major depressive disorder, single episode, severe without psychotic features: Secondary | ICD-10-CM | POA: Diagnosis not present

## 2016-10-08 DIAGNOSIS — R7303 Prediabetes: Secondary | ICD-10-CM | POA: Insufficient documentation

## 2016-10-08 LAB — POCT GLYCOSYLATED HEMOGLOBIN (HGB A1C): HEMOGLOBIN A1C: 4.9

## 2016-10-08 MED ORDER — SERTRALINE HCL 50 MG PO TABS: 50.0000 mg | ORAL_TABLET | Freq: Every day | ORAL | 6 refills | Status: DC

## 2016-10-08 NOTE — Progress Notes (Signed)
Pre visit review using our clinic review tool, if applicable. No additional management support is needed unless otherwise documented below in the visit note. 

## 2016-10-08 NOTE — Patient Instructions (Addendum)
Major Depressive Disorder, Adult Major depressive disorder (MDD) is a mental health condition. It may also be called clinical depression or unipolar depression. MDD usually causes feelings of sadness, hopelessness, or helplessness. MDD can also cause physical symptoms. It can interfere with work, school, relationships, and other everyday activities. MDD may be mild, moderate, or severe. It may occur once (single episode major depressive disorder) or it may occur multiple times (recurrent major depressive disorder). What are the causes? The exact cause of this condition is not known. MDD is most likely caused by a combination of things, which may include:  Genetic factors. These are traits that are passed along from parent to child.  Individual factors. Your personality, your behavior, and the way you handle your thoughts and feelings may contribute to MDD. This includes personality traits and behaviors learned from others.  Physical factors, such as:  Differences in the part of your brain that controls emotion. This part of your brain may be different than it is in people who do not have MDD.  Long-term (chronic) medical or psychiatric illnesses.  Social factors. Traumatic experiences or major life changes may play a role in the development of MDD. What increases the risk? This condition is more likely to develop in women. The following factors may also make you more likely to develop MDD:  A family history of depression.  Troubled family relationships.  Abnormally low levels of certain brain chemicals.  Traumatic events in childhood, especially abuse or the loss of a parent.  Being under a lot of stress, or long-term stress, especially from upsetting life experiences or losses.  A history of:  Chronic physical illness.  Other mental health disorders.  Substance abuse.  Poor living conditions.  Experiencing social exclusion or discrimination on a regular basis. What are the  signs or symptoms? The main symptoms of MDD typically include:  Constant depressed or irritable mood.  Loss of interest in things and activities. MDD symptoms may also include:  Sleeping or eating too much or too little.  Unexplained weight change.  Fatigue or low energy.  Feelings of worthlessness or guilt.  Difficulty thinking clearly or making decisions.  Thoughts of suicide or of harming others.  Physical agitation or weakness.  Isolation. Severe cases of MDD may also occur with other symptoms, such as:  Delusions or hallucinations, in which you imagine things that are not real (psychotic depression).  Low-level depression that lasts at least a year (chronic depression or persistent depressive disorder).  Extreme sadness and hopelessness (melancholic depression).  Trouble speaking and moving (catatonic depression). How is this diagnosed? This condition may be diagnosed based on:  Your symptoms.  Your medical history, including your mental health history. This may involve tests to evaluate your mental health. You may be asked questions about your lifestyle, including any drug and alcohol use, and how long you have had symptoms of MDD.  A physical exam.  Blood tests to rule out other conditions. You must have a depressed mood and at least four other MDD symptoms most of the day, nearly every day in the same 2-week timeframe before your health care provider can confirm a diagnosis of MDD. How is this treated? This condition is usually treated by mental health professionals, such as psychologists, psychiatrists, and clinical social workers. You may need more than one type of treatment. Treatment may include:  Psychotherapy. This is also called talk therapy or counseling. Types of psychotherapy include:  Cognitive behavioral therapy (CBT). This type of therapy  teaches you to recognize unhealthy feelings, thoughts, and behaviors, and replace them with positive thoughts  and actions.  Interpersonal therapy (IPT). This helps you to improve the way you relate to and communicate with others.  Family therapy. This treatment includes members of your family.  Medicine to treat anxiety and depression, or to help you control certain emotions and behaviors.  Lifestyle changes, such as:  Limiting alcohol and drug use.  Exercising regularly.  Getting plenty of sleep.  Making healthy eating choices.  Spending more time outdoors. Treatments involving stimulation of the brain can be used in situations with extremely severe symptoms, or when medicine or other therapies do not work over time. These treatments include electroconvulsive therapy, transcranial magnetic stimulation, and vagal nerve stimulation. Follow these instructions at home: Activity  Return to your normal activities as told by your health care provider.  Exercise regularly and spend time outdoors as told by your health care provider. General instructions  Take over-the-counter and prescription medicines only as told by your health care provider.  Do not drink alcohol. If you drink alcohol, limit your alcohol intake to no more than 1 drink a day for nonpregnant women and 2 drinks a day for men. One drink equals 12 oz of beer, 5 oz of wine, or 1 oz of hard liquor. Alcohol can affect any antidepressant medicines you are taking. Talk to your health care provider about your alcohol use.  Eat a healthy diet and get plenty of sleep.  Find activities that you enjoy doing, and make time to do them.  Consider joining a support group. Your health care provider may be able to recommend a support group.  Keep all follow-up visits as told by your health care provider. This is important. Where to find more information: [employer] Mental Illness  www.nami.org [employer] of Mental Health  http://www.maynard.net/www.nimh.nih.gov National Suicide Prevention Lifeline  1-800-273-TALK 551-859-7575(8255). This is  free, 24-hour help. Contact a health care provider if:  Your symptoms get worse.  You develop new symptoms. Get help right away if:  You self-harm.  You have serious thoughts about hurting yourself or others.  You see, hear, taste, smell, or feel things that are not present (hallucinate). This information is not intended to replace advice given to you by your health care provider. Make sure you discuss any questions you have with your health care provider. Document Released: 12/14/2012 Document Revised: 04/25/2016 Document Reviewed: 02/28/2016 Elsevier Interactive Patient Education  2017 [employer]  Neurologist you saw was Dr Roda ShuttersXu with [facility].

## 2016-10-08 NOTE — Progress Notes (Signed)
Subjective:     Patient ID: Jesse Pacheco, male   DOB: 1960-12-24, 56 y.o.   MRN: [id number]  HPI Patient seen today with some progressive depression symptoms. He was seen here back in October with some depression symptoms. PH Q-9 score of 17. We discussed counseling and possible medication and he declined both at that time. Since that time he's had progressive depression symptoms which are daily. He has depressed mood decreased concentration, anhedonia, feelings of hopelessness. He has occasional fleeting suicidal thoughts but states he does not have any concern for imminent intent.  He has early morning insomnia. Appetite has been stable.   Patient had CVA back in 2016. Since that time he states he has sometimes difficulty finding the appropriate word. He's also had some recent difficulties concentrating which may be related to his depression issues above. He is requesting going back to neurologist at this time. He has not had any recent visual changes, slurred speech, or any focal weakness.  History of prediabetes. Last blood sugar was nonfasting 149. No recent A1c. He has occasional nocturia but no polydipsia.  Past Medical History:  Diagnosis Date  . Depression   . HYPERTENSION 01/09/2009  . 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 Nocturia 01/09/2009  . VIRAL URI 03/22/2010   Past Surgical History:  Procedure Laterality Date  . APPENDECTOMY    . LOOP RECORDER IMPLANT N/A 11/29/2014   Procedure: LOOP RECORDER IMPLANT;  Surgeon: Hillis RangeJames Allred, MD;  [facility];  Service: Cardiovascular;  Laterality: N/A;  . SPINE SURGERY  2006, 2009   laminectomy  . TEE WITHOUT CARDIOVERSION N/A 11/29/2014   Procedure: TRANSESOPHAGEAL ECHOCARDIOGRAM (TEE);  Surgeon: Jake BatheMark C Skains, MD;  [facility];  Service: Cardiovascular;  Laterality: N/A;    reports that he has never smoked. He has never used smokeless tobacco. He reports that he drinks alcohol. He reports that he does not use  drugs. family history includes Heart disease (age of onset: 9558) in his father; Hypertension in his mother; Stroke in his mother. No Known Allergies   Review of Systems  Constitutional: Positive for fatigue. Negative for unexpected weight change.  Eyes: Negative for visual disturbance.  Respiratory: Negative for cough, chest tightness and shortness of breath.   Cardiovascular: Negative for chest pain, palpitations and leg swelling.  Neurological: Negative for dizziness, syncope, weakness, light-headedness and headaches.  Psychiatric/Behavioral: Positive for dysphoric mood and sleep disturbance. Negative for agitation, confusion and self-injury. The patient is nervous/anxious.        Objective:   Physical Exam  Constitutional: He is oriented to person, place, and time.  Patient is alert and cooperative but has depressed mood  Cardiovascular: Normal rate and regular rhythm.   Pulmonary/Chest: Effort normal and breath sounds normal. No respiratory distress. He has no wheezes. He has no rales.  Neurological: He is alert and oriented to person, place, and time. No cranial nerve deficit.  Psychiatric:  Mood depressed. PH Q-9 score of 24       Assessment:     #1 major depression-severe.   Patient's had fleeting suicidal ideations but no imminent thoughts or plans. He does not feel any threat at this point.   #2 history of CVA  #3 history prediabetes  #4 hypertension stable and at goal  #5 dyslipidemia  # 6 Fatigue probably related to #1        Plan:     -Check hemoglobin A1c=4.9% -Referral back to neurology per patient request -We have given  him brochure for [facility] and number to call if he decides to pursue counseling -Start Zoloft 50 mg once daily and reassess in 2-3 weeks -Follow-up immediately for any worsening depressive symptoms or suicidal ideation  Kristian Covey MD [facility] at [facility]

## 2016-10-13 ENCOUNTER — Other Ambulatory Visit: Payer: Self-pay | Admitting: Family Medicine

## 2016-10-21 ENCOUNTER — Other Ambulatory Visit (INDEPENDENT_AMBULATORY_CARE_PROVIDER_SITE_OTHER): Payer: BLUE CROSS/BLUE SHIELD

## 2016-10-21 ENCOUNTER — Other Ambulatory Visit: Payer: BLUE CROSS/BLUE SHIELD

## 2016-10-21 DIAGNOSIS — Z0000 Encounter for general adult medical examination without abnormal findings: Secondary | ICD-10-CM

## 2016-10-21 LAB — LIPID PANEL
CHOLESTEROL: 121 mg/dL (ref 0–200)
HDL: 37.4 mg/dL — AB (ref >39.00)
LDL Cholesterol: 63 mg/dL (ref 0–99)
NonHDL: 83.5
TRIGLYCERIDES: 104 mg/dL (ref 0.0–149.0)
Total CHOL/HDL Ratio: 3
VLDL: 20.8 mg/dL (ref 0.0–40.0)

## 2016-10-21 LAB — HEPATIC FUNCTION PANEL
ALBUMIN: 4.4 g/dL (ref 3.5–5.2)
ALT: 31 U/L (ref 0–53)
AST: 23 U/L (ref 0–37)
Alkaline Phosphatase: 54 U/L (ref 39–117)
Bilirubin, Direct: 0.2 mg/dL (ref 0.0–0.3)
TOTAL PROTEIN: 6.6 g/dL (ref 6.0–8.3)
Total Bilirubin: 1.2 mg/dL (ref 0.2–1.2)

## 2016-10-21 LAB — BASIC METABOLIC PANEL
BUN: 15 mg/dL (ref 6–23)
CO2: 31 mEq/L (ref 19–32)
CREATININE: 1.02 mg/dL (ref 0.40–1.50)
Calcium: 9.6 mg/dL (ref 8.4–10.5)
Chloride: 99 mEq/L (ref 96–112)
GFR: 79.91 mL/min (ref >60.00)
Glucose, Bld: 89 mg/dL (ref 70–99)
POTASSIUM: 4 meq/L (ref 3.5–5.1)
Sodium: 137 mEq/L (ref 135–145)

## 2016-10-21 LAB — CBC WITH DIFFERENTIAL/PLATELET
BASOS ABS: 0 10*3/uL (ref 0.0–0.1)
Basophils Relative: 0.6 % (ref 0.0–3.0)
EOS ABS: 0.2 10*3/uL (ref 0.0–0.7)
EOS PCT: 3.3 % (ref 0.0–5.0)
HEMATOCRIT: 46.6 % (ref 39.0–52.0)
Hemoglobin: 16.1 g/dL (ref 13.0–17.0)
Lymphocytes Relative: 23.3 % (ref 12.0–46.0)
Lymphs Abs: 1.3 10*3/uL (ref 0.7–4.0)
MCHC: 34.6 g/dL (ref 30.0–36.0)
MCV: 90.9 fl (ref 78.0–100.0)
MONO ABS: 0.6 10*3/uL (ref 0.1–1.0)
Monocytes Relative: 11.9 % (ref 3.0–12.0)
NEUTROS PCT: 60.9 % (ref 43.0–77.0)
Neutro Abs: 3.3 10*3/uL (ref 1.4–7.7)
PLATELETS: 221 10*3/uL (ref 150.0–400.0)
RBC: 5.13 Mil/uL (ref 4.22–5.81)
RDW: 13.3 % (ref 11.5–15.5)
WBC: 5.4 10*3/uL (ref 4.0–10.5)

## 2016-10-21 LAB — HEMOGLOBIN A1C: Hgb A1c MFr Bld: 5.4 % (ref 4.6–6.5)

## 2016-10-21 LAB — TSH: TSH: 1.12 u[IU]/mL (ref 0.35–4.50)

## 2016-10-21 LAB — PSA: PSA: 1.73 ng/mL (ref 0.10–4.00)

## 2016-10-22 ENCOUNTER — Other Ambulatory Visit: Payer: BLUE CROSS/BLUE SHIELD

## 2016-10-29 ENCOUNTER — Ambulatory Visit (INDEPENDENT_AMBULATORY_CARE_PROVIDER_SITE_OTHER): Payer: BLUE CROSS/BLUE SHIELD | Admitting: Family Medicine

## 2016-10-29 ENCOUNTER — Encounter: Payer: Self-pay | Admitting: Family Medicine

## 2016-10-29 VITALS — BP 104/70 | HR 78 | Ht 70.0 in | Wt 203.6 lb

## 2016-10-29 DIAGNOSIS — Z0000 Encounter for general adult medical examination without abnormal findings: Secondary | ICD-10-CM | POA: Diagnosis not present

## 2016-10-29 DIAGNOSIS — Z8673 Personal history of transient ischemic attack (TIA), and cerebral infarction without residual deficits: Secondary | ICD-10-CM

## 2016-10-29 MED ORDER — SERTRALINE HCL 100 MG PO TABS: 100.0000 mg | ORAL_TABLET | Freq: Every day | ORAL | 3 refills | Status: DC

## 2016-10-29 NOTE — Progress Notes (Signed)
Subjective:     Patient ID: Jesse Pacheco, male   DOB: 01-Apr-1961, 56 y.o.   MRN: [id number]  HPI  Patient seen for complete physical. His past medical history significant for hyperlipidemia, prediabetes, hypertension, BPH, history of elevated PSA, and history of acute left ACA/posterior MCA infarct.  He's done well since that time on aspirin, Lipitor, antihypertensive medications. Recent A1c 5.4%.  Battling with some depression issues recently. Recent PHQ-9 score of 24. Restarted Zoloft 50 mg once daily. He's not seen much improvement yet. Denies any suicidal ideation. He has not set up counseling yet.  He's starting to exercise more with walking.  Past Medical History:  Diagnosis Date  . Depression   . HYPERTENSION 01/09/2009  . 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 Nocturia 01/09/2009  . VIRAL URI 03/22/2010   Past Surgical History:  Procedure Laterality Date  . APPENDECTOMY    . LOOP RECORDER IMPLANT N/A 11/29/2014   Procedure: LOOP RECORDER IMPLANT;  Surgeon: Hillis RangeJames Allred, MD;  [facility];  Service: Cardiovascular;  Laterality: N/A;  . SPINE SURGERY  2006, 2009   laminectomy  . TEE WITHOUT CARDIOVERSION N/A 11/29/2014   Procedure: TRANSESOPHAGEAL ECHOCARDIOGRAM (TEE);  Surgeon: Jake BatheMark C Skains, MD;  [facility];  Service: Cardiovascular;  Laterality: N/A;    reports that he has never smoked. He has never used smokeless tobacco. He reports that he drinks alcohol. He reports that he does not use drugs. family history includes Heart disease (age of onset: 6058) in his father; Hypertension in his mother; Stroke in his mother. No Known Allergies  Review of Systems  Constitutional: Negative for activity change, appetite change, fatigue and fever.  HENT: Negative for congestion, ear pain and trouble swallowing.   Eyes: Negative for pain and visual disturbance.  Respiratory: Negative for cough, shortness of breath and wheezing.   Cardiovascular: Negative for chest  pain and palpitations.  Gastrointestinal: Negative for abdominal distention, abdominal pain, blood in stool, constipation, diarrhea, nausea, rectal pain and vomiting.  Endocrine: Negative for polydipsia and polyuria.  Genitourinary: Negative for dysuria, hematuria and testicular pain.  Musculoskeletal: Negative for arthralgias and joint swelling.  Skin: Negative for rash.  Neurological: Negative for dizziness, syncope and headaches.  Hematological: Negative for adenopathy.  Psychiatric/Behavioral: Negative for confusion and dysphoric mood.       Objective:   Physical Exam  Constitutional: He is oriented to person, place, and time. He appears well-developed and well-nourished. No distress.  HENT:  Head: Normocephalic and atraumatic.  Right Ear: External ear normal.  Left Ear: External ear normal.  Mouth/Throat: Oropharynx is clear and moist.  Eyes: Conjunctivae and EOM are normal. Pupils are equal, round, and reactive to light.  Neck: Normal range of motion. Neck supple. No thyromegaly present.  Cardiovascular: Normal rate, regular rhythm and normal heart sounds.   No murmur heard. Pulmonary/Chest: No respiratory distress. He has no wheezes. He has no rales.  Abdominal: Soft. Bowel sounds are normal. He exhibits no distension and no mass. There is no tenderness. There is no rebound and no guarding.  Musculoskeletal: He exhibits no edema.  Lymphadenopathy:    He has no cervical adenopathy.  Neurological: He is alert and oriented to person, place, and time. He displays normal reflexes. No cranial nerve deficit.  Skin: No rash noted.  Psychiatric: He has a normal mood and affect.       Assessment:     Physical exam.  Labs reviewed. He has mildly elevated PSA. He's had  previous negative biopsies per urology  Depression not in remission    Plan:     -Increase sertraline to 100 mg daily -We have again suggested he consider counseling which he will consider -Routine follow-up in  one month reassess -Set up neurology referral. He has several questions regarding his previous stroke couple years ago and wishes to consult neurologist. We discussed secondary prevention in detail today.  Kristian Covey MD [facility] at [facility]

## 2016-10-29 NOTE — Patient Instructions (Signed)
Go ahead and increase the Zoloft to 100 mg daily Let's plan on one month follow up.

## 2016-10-29 NOTE — Progress Notes (Signed)
Pre visit review using our clinic review tool, if applicable. No additional management support is needed unless otherwise documented below in the visit note. 

## 2016-11-26 ENCOUNTER — Ambulatory Visit: Payer: BLUE CROSS/BLUE SHIELD | Admitting: Family Medicine

## 2016-12-02 ENCOUNTER — Ambulatory Visit (INDEPENDENT_AMBULATORY_CARE_PROVIDER_SITE_OTHER): Payer: BLUE CROSS/BLUE SHIELD | Admitting: Neurology

## 2016-12-02 ENCOUNTER — Encounter: Payer: Self-pay | Admitting: Neurology

## 2016-12-02 VITALS — BP 102/64 | HR 73 | Ht 70.0 in | Wt 204.0 lb

## 2016-12-02 DIAGNOSIS — I639 Cerebral infarction, unspecified: Secondary | ICD-10-CM

## 2016-12-02 DIAGNOSIS — R4702 Dysphasia: Secondary | ICD-10-CM

## 2016-12-02 DIAGNOSIS — I10 Essential (primary) hypertension: Secondary | ICD-10-CM

## 2016-12-02 DIAGNOSIS — R413 Other amnesia: Secondary | ICD-10-CM

## 2016-12-02 DIAGNOSIS — E785 Hyperlipidemia, unspecified: Secondary | ICD-10-CM

## 2016-12-02 MED ORDER — CLOPIDOGREL BISULFATE 75 MG PO TABS: 75.0000 mg | ORAL_TABLET | Freq: Every day | ORAL | 0 refills | Status: DC

## 2016-12-02 NOTE — Patient Instructions (Signed)
1.  Stop aspirin.  Instead, start Plavix (clopidogrel)  daily for secondary stroke prevention.  I will prescribe it for you, but further refills can be made by your PCP. 2.  Continue Lipitor 3.  Continue blood pressure control 4.  Routine exercise 5.  Mediterranean diet  Mediterranean Diet A Mediterranean diet refers to food and lifestyle choices that are based on the traditions of countries located on the [employer]. This way of eating has been shown to help prevent certain conditions and improve outcomes for people who have chronic diseases, like kidney disease and heart disease. What are tips for following this plan? Lifestyle   Cook and eat meals together with your family, when possible.  Drink enough fluid to keep your urine clear or pale yellow.  Be physically active every day. This includes:  Aerobic exercise like running or swimming.  Leisure activities like gardening, walking, or housework.  Get 7-8 hours of sleep each night.  If recommended by your health care provider, drink red wine in moderation. This means 1 glass a day for nonpregnant women and 2 glasses a day for men. A glass of wine equals 5 oz (150 mL). Reading food labels   Check the serving size of packaged foods. For foods such as rice and pasta, the serving size refers to the amount of cooked product, not dry.  Check the total fat in packaged foods. Avoid foods that have saturated fat or trans fats.  Check the ingredients list for added sugars, such as corn syrup. Shopping   At the grocery store, buy most of your food from the areas near the walls of the store. This includes:  Fresh fruits and vegetables (produce).  Grains, beans, nuts, and seeds. Some of these may be available in unpackaged forms or large amounts (in bulk).  Fresh seafood.  Poultry and eggs.  Low-fat dairy products.  Buy whole ingredients instead of prepackaged foods.  Buy fresh fruits and vegetables in-season from  local farmers markets.  Buy frozen fruits and vegetables in resealable bags.  If you do not have access to quality fresh seafood, buy precooked frozen shrimp or canned fish, such as tuna, salmon, or sardines.  Buy small amounts of raw or cooked vegetables, salads, or olives from the deli or salad bar at your store.  Stock your pantry so you always have certain foods on hand, such as olive oil, canned tuna, canned tomatoes, rice, pasta, and beans. Cooking   Cook foods with extra-virgin olive oil instead of using butter or other vegetable oils.  Have meat as a side dish, and have vegetables or grains as your main dish. This means having meat in small portions or adding small amounts of meat to foods like pasta or stew.  Use beans or vegetables instead of meat in common dishes like chili or lasagna.  Experiment with different cooking methods. Try roasting or broiling vegetables instead of steaming or sauteing them.  Add frozen vegetables to soups, stews, pasta, or rice.  Add nuts or seeds for added healthy fat at each meal. You can add these to yogurt, salads, or vegetable dishes.  Marinate fish or vegetables using olive oil, lemon juice, garlic, and fresh herbs. Meal planning   Plan to eat 1 vegetarian meal one day each week. Try to work up to 2 vegetarian meals, if possible.  Eat seafood 2 or more times a week.  Have healthy snacks readily available, such as:  Vegetable sticks with hummus.  Greek yogurt.  Fruit and nut trail mix.  Eat balanced meals throughout the week. This includes:  Fruit: 2-3 servings a day  Vegetables: 4-5 servings a day  Low-fat dairy: 2 servings a day  Fish, poultry, or lean meat: 1 serving a day  Beans and legumes: 2 or more servings a week  Nuts and seeds: 1-2 servings a day  Whole grains: 6-8 servings a day  Extra-virgin olive oil: 3-4 servings a day  Limit red meat and sweets to only a few servings a month What are my food  choices?  Mediterranean diet  Recommended  Grains: Whole-grain pasta. Brown rice. Bulgar wheat. Polenta. Couscous. Whole-wheat bread. Orpah Cobb.  Vegetables: Artichokes. Beets. Broccoli. Cabbage. Carrots. Eggplant. Green beans. Chard. Kale. Spinach. Onions. Leeks. Peas. Squash. Tomatoes. Peppers. Radishes.  Fruits: Apples. Apricots. Avocado. Berries. Bananas. Cherries. Dates. Figs. Grapes. Lemons. Melon. Oranges. Peaches. Plums. Pomegranate.  Meats and other protein foods: Beans. Almonds. Sunflower seeds. Pine nuts. Peanuts. Cod. Salmon. Scallops. Shrimp. Tuna. Tilapia. Clams. Oysters. Eggs.  Dairy: Low-fat milk. Cheese. Greek yogurt.  Beverages: Water. Red wine. Herbal tea.  Fats and oils: Extra virgin olive oil. Avocado oil. Grape seed oil.  Sweets and desserts: [place] yogurt with honey. Baked apples. Poached pears. Trail mix.  Seasoning and other foods: Basil. Cilantro. Coriander. Cumin. Mint. Parsley. Sage. Rosemary. Tarragon. Garlic. Oregano. Thyme. Pepper. Balsalmic vinegar. Tahini. Hummus. Tomato sauce. Olives. Mushrooms.  Limit these  Grains: Prepackaged pasta or rice dishes. Prepackaged cereal with added sugar.  Vegetables: Deep fried potatoes (french fries).  Fruits: Fruit canned in syrup.  Meats and other protein foods: Beef. Pork. Lamb. Poultry with skin. Hot dogs. Tomasa Blase.  Dairy: Ice cream. Sour cream. Whole milk.  Beverages: Juice. Sugar-sweetened soft drinks. Beer. Liquor and spirits.  Fats and oils: Butter. Canola oil. Vegetable oil. Beef fat (tallow). Lard.  Sweets and desserts: Cookies. Cakes. Pies. Candy.  Seasoning and other foods: Mayonnaise. Premade sauces and marinades.  The items listed may not be a complete list. Talk with your dietitian about what dietary choices are right for you. Summary  The Mediterranean diet includes both food and lifestyle choices.  Eat a variety of fresh fruits and vegetables, beans, nuts, seeds, and whole  grains.  Limit the amount of red meat and sweets that you eat.  Talk with your health care provider about whether it is safe for you to drink red wine in moderation. This means 1 glass a day for nonpregnant women and 2 glasses a day for men. A glass of wine equals 5 oz (150 mL). This information is not intended to replace advice given to you by your health care provider. Make sure you discuss any questions you have with your health care provider. Document Released: 04/11/2016 Document Revised: 05/14/2016 Document Reviewed: 04/11/2016 Elsevier Interactive Patient Education  2017 Elsevier Inc. 6.  Follow up as needed.

## 2016-12-02 NOTE — Progress Notes (Signed)
NEUROLOGY CONSULTATION NOTE  Jesse Pacheco MRN: [id number] DOB: 07/06/1961  Referring provider: Dr. Caryl Never Primary care provider: Dr. Caryl Never  Reason for consult:  stroke  HISTORY OF PRESENT ILLNESS: Jesse Pacheco is a 56 year old right-handed male with hypertension and hyperlipidemia who presents for history of stroke.  He is accompanied by his girlfriend who supplements history.  History supplemented by hospital records of his admission.  He was admitted to [facility] from 11/27/14 to 11/29/14 with right arm and leg weakness and confusion.  TPA was not used due to mild and resolving symptoms.  CT of head was negative for acute stroke, however MRI of the brain revealed acute infarcts in the left ACA and left posterior MCA territories.  MRA of head showed occlusion of the A2 segment of the left ACA.  Carotid doppler did not reveal hemodynamically significant ICA stenosis.  Echocardiogram showed EF 55-60% with no cardiac source of embolism.  Hgb A1c was 5.5 and LDL was 135.  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  Lower extremity doppler revealed no DVT.  He was advised to continue antiplatelet therapy and was started on atorvastatin  daily.  He had a loop recorder implanted, which has not demonstrated atrial fibrillation.  He was on ASA at the time of his stroke.  He subsequently did not remain on Plavix and continues to take ASA  daily.  For a little while after the stroke, he went to PT/OT and speech therapy.  Weakness has resolved.  He still reports language difficulty.  He sometimes stutters, has word-finding difficulties or makes paraphasic errors.  He also reports short-term memory problems such as forgetting what errands he needs to accomplish.  He has history of vocal tics, which he says have gotten worse after the stroke.  He works in home improvement.  10/21/16:  BMP with Na 137, K 4, glucose 89, BUN 15, Cr 1.02; Hgb A1c 5.4; lipid  panel with total cholesterol 121, TG 104, HDL 37.40, and LDL 63.  PAST MEDICAL HISTORY: Past Medical History:  Diagnosis Date  . Depression   . HYPERTENSION 01/09/2009  . 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 Nocturia 01/09/2009  . VIRAL URI 03/22/2010    PAST SURGICAL HISTORY: Past Surgical History:  Procedure Laterality Date  . APPENDECTOMY    . LOOP RECORDER IMPLANT N/A 11/29/2014   Procedure: LOOP RECORDER IMPLANT;  Surgeon: Hillis Range, MD;  Location: [facility] CATH LAB;  Service: Cardiovascular;  Laterality: N/A;  . SPINE SURGERY  2006, 2009   laminectomy  . TEE WITHOUT CARDIOVERSION N/A 11/29/2014   Procedure: TRANSESOPHAGEAL ECHOCARDIOGRAM (TEE);  Surgeon: Jake Bathe, MD;  Location: [facility] ENDOSCOPY;  Service: Cardiovascular;  Laterality: N/A;    MEDICATIONS: Current Outpatient Prescriptions on File Prior to Visit  Medication Sig Dispense Refill  . atorvastatin (LIPITOR) 80 MG tablet TAKE 1 TABLET BY MOUTH EVERY DAY 90 tablet 3  . chlorthalidone (HYGROTON) 25 MG tablet TAKE 1 TABLET (25 MG TOTAL) BY MOUTH DAILY. 90 tablet 3  . finasteride (PROSCAR) 5 MG tablet Take 1 tablet (5 mg total) by mouth daily. 30 tablet 5  . ibuprofen (ADVIL,MOTRIN) 200 MG tablet Take 200 mg by mouth every 6 (six) hours as needed for mild pain or moderate pain.    Marland Kitchen lisinopril (PRINIVIL,ZESTRIL) 40 MG tablet TAKE 1 TABLET BY MOUTH EVERY DAY. PT NEEDS AN APPT FOR FURTHER REFILLS. 90 tablet 3  . Multiple Vitamin (MULITIVITAMIN WITH MINERALS)  TABS Take 1 tablet by mouth daily.    . sertraline (ZOLOFT) 100 MG tablet Take 1 tablet (100 mg total) by mouth daily. 90 tablet 3  . tamsulosin (FLOMAX) 0.4 MG CAPS capsule TAKE 1 CAPSULE DAILY 90 capsule 3  . traZODone (DESYREL) 50 MG tablet Take 1 tablet (50 mg total) by mouth at bedtime. 30 tablet 5   No current facility-administered medications on file prior to visit.     ALLERGIES: No Known Allergies  FAMILY HISTORY: Family History  Problem Relation Age of  Onset  . Hypertension Mother   . Stroke Mother   . Heart disease Father 56    CABG    SOCIAL HISTORY: Social History   Social History  . Marital status: Single    Spouse name: N/A  . Number of children: N/A  . Years of education: N/A   Occupational History  . Not on file.   Social History Main Topics  . Smoking status: Never Smoker  . Smokeless tobacco: Never Used  . Alcohol use Yes     Comment: drinks 3-4 times a week  . Drug use: No  . Sexual activity: Not on file   Other Topics Concern  . Not on file   Social History Narrative  . No narrative on file    REVIEW OF SYSTEMS: Constitutional: No fevers, chills, or sweats, no generalized fatigue, change in appetite Eyes: No visual changes, double vision, eye pain Ear, nose and throat: No hearing loss, ear pain, nasal congestion, sore throat Cardiovascular: No chest pain, palpitations Respiratory:  No shortness of breath at rest or with exertion, wheezes GastrointestinaI: No nausea, vomiting, diarrhea, abdominal pain, fecal incontinence Genitourinary:  No dysuria, urinary retention or frequency Musculoskeletal:  No neck pain, back pain Integumentary: No rash, pruritus, skin lesions Neurological: as above Psychiatric: No depression, insomnia, anxiety Endocrine: No palpitations, fatigue, diaphoresis, mood swings, change in appetite, change in weight, increased thirst Hematologic/Lymphatic:  No purpura, petechiae. Allergic/Immunologic: no itchy/runny eyes, nasal congestion, recent allergic reactions, rashes  PHYSICAL EXAM: Vitals:   12/02/16 1256  BP: 102/64  Pulse: 73   General: No acute distress.  Patient appears well-groomed.  Head:  Normocephalic/atraumatic Eyes:  fundi examined but not visualized Neck: supple, no paraspinal tenderness, full range of motion Back: No paraspinal tenderness Heart: regular rate and rhythm Lungs: Clear to auscultation bilaterally. Vascular: No carotid bruits. Neurological  Exam: Mental status: alert and oriented to person, place, and time, delayed recall 1 of 3 words, remote memory intact, fund of knowledge intact, attention and concentration intact, speech fluent and not dysarthric, language intact.  Incorrectly copied intersecting pentagons. MMSE - Mini Mental State Exam 12/02/2016  Orientation to time 5  Orientation to Place 5  Registration 3  Attention/ Calculation 5  Recall 1  Language- name 2 objects 2  Language- repeat 1  Language- follow 3 step command 3  Language- read & follow direction 1  Write a sentence 1  Copy design 0  Total score 27   Cranial nerves: CN I: not tested CN II: pupils equal, round and reactive to light, visual fields intact CN III, IV, VI:  full range of motion, no nystagmus, no ptosis CN V: facial sensation intact CN VII: upper and lower face symmetric CN VIII: hearing intact CN IX, X: gag intact, uvula midline CN XI: sternocleidomastoid and trapezius muscles intact CN XII: tongue midline Bulk & Tone: normal, no fasciculations. Motor:  5/5 throughout  Sensation: temperature and vibration sensation intact. Deep  Tendon Reflexes:  2+ throughout, toes downgoing.  Finger to nose testing:  Without dysmetria.  Heel to shin:  Without dysmetria.  Gait:  Normal station and stride.  Able to turn and tandem walk. Romberg negative.  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  Recommended switching antiplatelet therapy from ASA to Plavix  daily (as he failed ASA therapy).  I will send in a prescription.  Future prescriptions may be filled by Dr. Caryl Never. 2.  Continue Lipitor  daily (LDL at goal of less than 70) 3.  Continue blood pressure management 4.  Routine exercise 5.  Mediterranean diet 6.  Follow up  as needed.  Thank you for allowing me to take part in the care of this patient.  Shon Millet, DO  CC:  Evelena Peat, MD

## 2016-12-13 ENCOUNTER — Telehealth: Payer: Self-pay | Admitting: Family Medicine

## 2016-12-13 MED ORDER — TRAZODONE HCL 50 MG PO TABS: 50.0000 mg | ORAL_TABLET | Freq: Every day | ORAL | 3 refills | Status: DC

## 2016-12-13 NOTE — Telephone Encounter (Signed)
° ° ° °  Pt request refill of the following:  traZODone (DESYREL) 50 MG tablet   Phamacy:  [facility]

## 2016-12-13 NOTE — Telephone Encounter (Signed)
traZODone (DESYREL) 50 MG tablet Last refill 05/15/16 last office visit 10/29/16.  Okay to fill?

## 2016-12-13 NOTE — Telephone Encounter (Signed)
Rx sent 

## 2016-12-13 NOTE — Telephone Encounter (Signed)
Refill for one year 

## 2016-12-14 ENCOUNTER — Other Ambulatory Visit: Payer: Self-pay | Admitting: Family Medicine

## 2016-12-28 ENCOUNTER — Other Ambulatory Visit: Payer: Self-pay | Admitting: Family Medicine

## 2016-12-29 ENCOUNTER — Other Ambulatory Visit: Payer: Self-pay | Admitting: Neurology

## 2016-12-30 ENCOUNTER — Other Ambulatory Visit: Payer: Self-pay | Admitting: Family Medicine

## 2017-06-24 ENCOUNTER — Ambulatory Visit (INDEPENDENT_AMBULATORY_CARE_PROVIDER_SITE_OTHER): Payer: BLUE CROSS/BLUE SHIELD | Admitting: Family Medicine

## 2017-06-24 ENCOUNTER — Encounter: Payer: Self-pay | Admitting: Family Medicine

## 2017-06-24 VITALS — BP 110/80 | HR 96 | Temp 98.1°F | Wt 201.3 lb

## 2017-06-24 DIAGNOSIS — Z23 Encounter for immunization: Secondary | ICD-10-CM

## 2017-06-24 DIAGNOSIS — I10 Essential (primary) hypertension: Secondary | ICD-10-CM

## 2017-06-24 DIAGNOSIS — E785 Hyperlipidemia, unspecified: Secondary | ICD-10-CM | POA: Diagnosis not present

## 2017-06-24 DIAGNOSIS — Z8673 Personal history of transient ischemic attack (TIA), and cerebral infarction without residual deficits: Secondary | ICD-10-CM

## 2017-06-24 MED ORDER — LISINOPRIL 40 MG PO TABS: ORAL_TABLET | ORAL | 3 refills | Status: DC

## 2017-06-24 MED ORDER — ATORVASTATIN CALCIUM 80 MG PO TABS: 80.0000 mg | ORAL_TABLET | Freq: Every day | ORAL | 3 refills | Status: DC

## 2017-06-24 MED ORDER — CHLORTHALIDONE 25 MG PO TABS: 25.0000 mg | ORAL_TABLET | Freq: Every day | ORAL | 3 refills | Status: DC

## 2017-06-24 MED ORDER — TAMSULOSIN HCL 0.4 MG PO CAPS: 0.4000 mg | ORAL_CAPSULE | Freq: Every day | ORAL | 3 refills | Status: DC

## 2017-06-24 MED ORDER — CLOPIDOGREL BISULFATE 75 MG PO TABS: 75.0000 mg | ORAL_TABLET | Freq: Every day | ORAL | 3 refills | Status: DC

## 2017-06-24 MED ORDER — SERTRALINE HCL 100 MG PO TABS: 100.0000 mg | ORAL_TABLET | Freq: Every day | ORAL | 3 refills | Status: DC

## 2017-06-24 MED ORDER — FINASTERIDE 5 MG PO TABS: 5.0000 mg | ORAL_TABLET | Freq: Every day | ORAL | 3 refills | Status: DC

## 2017-06-24 MED ORDER — TRAZODONE HCL 50 MG PO TABS: 50.0000 mg | ORAL_TABLET | Freq: Every day | ORAL | 3 refills | Status: DC

## 2017-06-24 NOTE — Progress Notes (Signed)
Subjective:     Patient ID: Jesse Pacheco, male   DOB: [birth year], 56 y.o.   MRN: [id number]  HPI  Patient seen for medical follow-up. He has history of stroke, prediabetes, hyperlipidemia, hypertension, BPH. Needs refills basically all medications today. Saw neurologist last year and switched from aspirin to Plavix. No recent chest pains. No new symptoms. Urine flow is strong.  Patient had labs last February and LDL is at goal. Last A1c 5.4%. Blood pressures been stable. No dizziness. No headaches.  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  Past Medical History:  Diagnosis Date  . Depression   . HYPERTENSION 01/09/2009  . 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 Nocturia 01/09/2009  . VIRAL URI 03/22/2010   Past Surgical History:  Procedure Laterality Date  . APPENDECTOMY    . LOOP RECORDER IMPLANT N/A 11/29/2014   Procedure: LOOP RECORDER IMPLANT;  Surgeon: Hillis RangeJames Allred, MD;  [facility];  Service: Cardiovascular;  Laterality: N/A;  . SPINE SURGERY  2006, 2009   laminectomy  . TEE WITHOUT CARDIOVERSION N/A 11/29/2014   Procedure: TRANSESOPHAGEAL ECHOCARDIOGRAM (TEE);  Surgeon: Jake BatheMark C Skains, MD;  [facility];  Service: Cardiovascular;  Laterality: N/A;    reports that he has never smoked. He has never used smokeless tobacco. He reports that he drinks alcohol. He reports that he does not use drugs. family history includes Heart disease (age of onset: 6058) in his father; Hypertension in his mother; Stroke in his mother. No Known Allergies  Review of Systems  Constitutional: Negative for fatigue and unexpected weight change.  Eyes: Negative for visual disturbance.  Respiratory: Negative for cough, chest tightness and shortness of breath.   Cardiovascular: Negative for chest pain, palpitations and leg swelling.  Endocrine: Negative for polydipsia and polyuria.  Genitourinary: Negative for dysuria.   Neurological: Negative for dizziness, syncope, weakness, light-headedness and headaches.       Objective:   Physical Exam  Constitutional: He is oriented to person, place, and time. He appears well-developed and well-nourished.  HENT:  Right Ear: External ear normal.  Left Ear: External ear normal.  Mouth/Throat: Oropharynx is clear and moist.  Eyes: Pupils are equal, round, and reactive to light.  Neck: Neck supple. No thyromegaly present.  Cardiovascular: Normal rate and regular rhythm.   Pulmonary/Chest: Effort normal and breath sounds normal. No respiratory distress. He has no wheezes. He has no rales.  Musculoskeletal: He exhibits no edema.  Neurological: He is alert and oriented to person, place, and time.       Assessment:     #1 past history of CVA  #2 hypertension stable and at goal  #3 dyslipidemia  #4 BPH symptomatically stable on finasteride    Plan:     -Flu vaccine given -Refill all medications today for one year -Plan routine follow-up in 6 months and recheck labs that point including lipid panel, basic metabolic panel, hepatic panel  Kristian CoveyBruce W Zailey Audia MD [facility]

## 2018-02-17 ENCOUNTER — Telehealth: Payer: Self-pay | Admitting: Family Medicine

## 2018-02-17 ENCOUNTER — Encounter: Payer: Self-pay | Admitting: Family Medicine

## 2018-02-17 ENCOUNTER — Ambulatory Visit: Payer: BLUE CROSS/BLUE SHIELD | Admitting: Family Medicine

## 2018-02-17 VITALS — BP 114/74 | HR 94 | Temp 98.2°F | Wt 191.6 lb

## 2018-02-17 DIAGNOSIS — R351 Nocturia: Secondary | ICD-10-CM | POA: Diagnosis not present

## 2018-02-17 DIAGNOSIS — N529 Male erectile dysfunction, unspecified: Secondary | ICD-10-CM

## 2018-02-17 DIAGNOSIS — N400 Enlarged prostate without lower urinary tract symptoms: Secondary | ICD-10-CM | POA: Diagnosis not present

## 2018-02-17 DIAGNOSIS — R358 Other polyuria: Secondary | ICD-10-CM | POA: Diagnosis not present

## 2018-02-17 DIAGNOSIS — R3589 Other polyuria: Secondary | ICD-10-CM

## 2018-02-17 LAB — POCT URINALYSIS DIPSTICK
BILIRUBIN UA: NEGATIVE
Blood, UA: NEGATIVE
Glucose, UA: NEGATIVE
KETONES UA: NEGATIVE
Leukocytes, UA: NEGATIVE
Nitrite, UA: NEGATIVE
PROTEIN UA: NEGATIVE
Spec Grav, UA: 1.01 (ref 1.010–1.025)
Urobilinogen, UA: 0.2 E.U./dL
pH, UA: 6.5 (ref 5.0–8.0)

## 2018-02-17 MED ORDER — TADALAFIL 5 MG PO TABS: 5.0000 mg | ORAL_TABLET | Freq: Every day | ORAL | 11 refills | Status: DC | PRN

## 2018-02-17 NOTE — Progress Notes (Signed)
  Subjective:     Patient ID: Jesse Pacheco, male   DOB: 1961-02-20, 57 y.o.   MRN: [id number]  HPI Patient seen with chief complaint of frequent urination for the past couple weeks or so. Sometimes gets up about 4 times a night to urinate. He does have history of BPH and is on finasteride and Flomax. His stream is fairly good. Denies any weight loss or polydipsia. No history of diabetes. No burning with urination.  Other issue is erectile dysfunction. He has difficulty with quality of erection and maintaining erection. Libido is fair. No recent change in medications  Past Medical History:  Diagnosis Date  . Depression   . HYPERTENSION 01/09/2009  . 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 Nocturia 01/09/2009  . VIRAL URI 03/22/2010   Past Surgical History:  Procedure Laterality Date  . APPENDECTOMY    . LOOP RECORDER IMPLANT N/A 11/29/2014   Procedure: LOOP RECORDER IMPLANT;  Surgeon: Hillis RangeJames Allred, MD;  [facility];  Service: Cardiovascular;  Laterality: N/A;  . SPINE SURGERY  2006, 2009   laminectomy  . TEE WITHOUT CARDIOVERSION N/A 11/29/2014   Procedure: TRANSESOPHAGEAL ECHOCARDIOGRAM (TEE);  Surgeon: Jake BatheMark C Skains, MD;  [facility];  Service: Cardiovascular;  Laterality: N/A;    reports that he has never smoked. He has never used smokeless tobacco. He reports that he drinks alcohol. He reports that he does not use drugs. family history includes Heart disease (age of onset: 3058) in his father; Hypertension in his mother; Stroke in his mother. No Known Allergies   Review of Systems  Constitutional: Negative for chills and fever.  Cardiovascular: Negative for chest pain.  Genitourinary: Positive for frequency. Negative for decreased urine volume, difficulty urinating, discharge, dysuria and hematuria.       Objective:   Physical Exam  Constitutional: He appears well-developed and well-nourished.  Cardiovascular: Normal rate and regular rhythm.   Pulmonary/Chest: Effort normal and breath sounds normal.  Musculoskeletal: He exhibits no edema.       Assessment:     #1 urine frequency. Urine dipstick completely normal. No evidence for infection. No glucosuria. Question element of urinary urgency. He does have history of BPH but fairly strong stream and is not aware of any incomplete emptying  #2 erectile dysfunction    Plan:     -Strict avoidance of caffeine after about 2 PM -We discussed trial of Cialis 5 mg daily-if he can get insurance coverage.    Jesse CoveyBruce W Nnaemeka Samson MD  [facility]

## 2018-02-17 NOTE — Telephone Encounter (Signed)
OK to take sertraline off list- if he is no longer taking.  I am confused why he would want us to send actual office notes to employer.  We discussed erectile dysfunction and urine frequency symptoms and his work should not need these.  Does he just need documentation he was here?   If so, could send short letter verifying he was seen by us.

## 2018-02-17 NOTE — Telephone Encounter (Signed)
Left message on machine for patient.  Does he need a doctor's note or a copy of today's office note?

## 2018-02-17 NOTE — Telephone Encounter (Signed)
°  Pt would like todays notes and  also he weaned himself off anxiety medication sertraline (ZOLOFT) 100 MG tablet  And needs a note stating he is no longer taking this.  He said to take off medication list too, please.   Fax [phone]  Attn Liborio NixonJanice

## 2018-02-17 NOTE — Telephone Encounter (Signed)
Copied from CRM [id number]. Topic: Quick Communication - Rx Refill/Question >> Feb 17, 2018  4:03 PM Crist InfanteHarrald, Kathy J wrote: Medication: tadalafil (CIALIS) 5 MG tablet  pharmacy called to advise they never received this rx. Pt was there to pick up. Would like you to resend please  [id number] [place], [place] - [address] [phone] (Phone) [phone] (Fax)

## 2018-02-17 NOTE — Telephone Encounter (Signed)
Copied from CRM [id number]. Topic: Quick Communication - See Telephone Encounter >> Feb 17, 2018  1:19 PM Burchel, Abbi R wrote:  See Telephone encounter for: 02/17/18.  Pt called to request office notes from 02/17/2018 to be faxed to his employer [phone] Attn: Liborio NixonJanice  Pt also wants to notify Dr. Caryl NeverBurchette that he is no longer taking the sertraline (ZOLOFT) 100 MG tablet.

## 2018-02-17 NOTE — Telephone Encounter (Signed)
Okay to give notes for work?  Okay to take zoloft off medication list?

## 2018-02-17 NOTE — Patient Instructions (Signed)
Set up complete physical.   

## 2018-02-18 MED ORDER — TADALAFIL 5 MG PO TABS: 5.0000 mg | ORAL_TABLET | Freq: Every day | ORAL | 11 refills | Status: DC | PRN

## 2018-02-18 NOTE — Telephone Encounter (Signed)
Rx resent.

## 2018-02-18 NOTE — Progress Notes (Signed)
Patient is no longer taking sertraline, per patient

## 2018-02-18 NOTE — Telephone Encounter (Signed)
Spoke with patient and explained that his diagnosis will be on the office visit note.  Patient insists on having the office visit note faxed. Zoloft taking of med list and add to the office note .

## 2018-02-18 NOTE — Telephone Encounter (Signed)
Office note fax and confirmed per patient request

## 2018-02-19 ENCOUNTER — Telehealth: Payer: Self-pay | Admitting: Family Medicine

## 2018-02-19 NOTE — Telephone Encounter (Unsigned)
Copied from CRM [id number]. Topic: Quick Communication - See Telephone Encounter >> Feb 19, 2018 12:24 PM Raquel SarnaHayes, Teresa G wrote: Pt is asking for the updated and most recent medication list be faxed to his employer.  [employer] - [phone]  Att:  Lequita HaltMorgan

## 2018-02-19 NOTE — Telephone Encounter (Signed)
Information has been faxed  

## 2018-03-20 ENCOUNTER — Telehealth: Payer: Self-pay | Admitting: *Deleted

## 2018-03-20 MED ORDER — SILDENAFIL CITRATE 100 MG PO TABS: ORAL_TABLET | ORAL | 99 refills | Status: DC

## 2018-03-20 NOTE — Telephone Encounter (Signed)
Copied from CRM [id number]. Topic: General - Other >> Mar 20, 2018  1:03 PM Tamela OddiHarris, Brenda J wrote: Reason for CRM: Patient called to request a alternative medication to Cialis.  His insurance will not pay for the Cialis.  Patient stated that the doctor suggested a different viagra that the patient would be able to take and the patient now wants to discuss.  Please advise.  CB# [phone].

## 2018-03-20 NOTE — Telephone Encounter (Signed)
New rx sent

## 2018-03-20 NOTE — Telephone Encounter (Signed)
Let's try Viagra 100 mg one half to one tablet qd prn erectile dysfunction #6 with prn refills.

## 2018-04-06 ENCOUNTER — Telehealth: Payer: Self-pay | Admitting: Family Medicine

## 2018-04-06 NOTE — Telephone Encounter (Signed)
Copied from CRM [id number]. 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

## 2018-04-06 NOTE — Telephone Encounter (Signed)
Patient came by the office and picked up a copy

## 2018-05-27 ENCOUNTER — Other Ambulatory Visit: Payer: Self-pay | Admitting: Family Medicine

## 2018-06-29 ENCOUNTER — Other Ambulatory Visit: Payer: Self-pay | Admitting: Family Medicine

## 2018-07-04 ENCOUNTER — Other Ambulatory Visit: Payer: Self-pay | Admitting: Family Medicine

## 2018-07-06 ENCOUNTER — Other Ambulatory Visit: Payer: Self-pay | Admitting: Family Medicine

## 2018-07-06 ENCOUNTER — Telehealth: Payer: Self-pay | Admitting: Family Medicine

## 2018-07-06 NOTE — Telephone Encounter (Signed)
Last OV 02/17/18 (Nocturia), No future OV  Last labs 10/21/2016 ( Last Year!!)  The medications filled today were only 30 day with a note to schedule an OV with labs needed.  Please advise if okay for Sertraline (Not currently on med list), Hygroton, and Trazodone are okay to fill?

## 2018-07-06 NOTE — Telephone Encounter (Signed)
Copied from CRM [id number]. Topic: Quick Communication - Rx Refill/Question >> Jul 06, 2018 12:05 PM Laural Benes, [place] C wrote: Medication: sertraline (ZOLOFT) 100 MG tablet [[id number]]   Has the patient contacted their pharmacy? Yes  (Agent: If no, request that the patient contact the pharmacy for the refill.) (Agent: If yes, when and what did the pharmacy advise?)  Preferred Pharmacy (with phone number or street name): [id number] [place], [place] - [address] [phone] (Phone) [phone] (Fax)    Agent: Please be advised that RX refills may take up to 3 business days. We ask that you follow-up with your pharmacy.

## 2018-07-06 NOTE — Telephone Encounter (Signed)
OK to go ahead and refill for 3 months.

## 2018-07-06 NOTE — Telephone Encounter (Signed)
Relation to pt: self  Call back number: [phone]  Pharmacy[id number] [place], [place] - [address] [phone] (Phone) [phone] (Fax)     Patient requesting the following medication, stating he's completely out. Patient states pharmacy sent in Rx on 07/02/18. Patient states sertraline was d/c and he would like to continue. 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  atorvastatin (LIPITOR) 80 MG tablet  chlorthalidone (HYGROTON) 25 MG tablet  clopidogrel (PLAVIX) 75 MG tablet  finasteride (PROSCAR) 5 MG tablet  tamsulosin (FLOMAX) 0.4 MG CAPS capsule  traZODone (DESYREL) 50 MG tablet    New Insurance :  [employer] Member ID # [id number] Group [id number] effective today.

## 2018-07-06 NOTE — Telephone Encounter (Signed)
Attempted to call pt regarding refill request for Sertraline 100 mg but no answer at this time. Mailbox is full and unable to accept messages at this time. Sertraline is not on pt's current medication list.

## 2018-07-07 ENCOUNTER — Other Ambulatory Visit: Payer: Self-pay

## 2018-07-07 MED ORDER — CHLORTHALIDONE 25 MG PO TABS: 25.0000 mg | ORAL_TABLET | Freq: Every day | ORAL | 0 refills | Status: DC

## 2018-07-07 MED ORDER — SERTRALINE HCL 100 MG PO TABS: 100.0000 mg | ORAL_TABLET | Freq: Every day | ORAL | 0 refills | Status: DC

## 2018-07-07 NOTE — Telephone Encounter (Signed)
Discussed with Dr. Caryl Never and let him know that the patients last labs were in 2018 and Dr. Caryl Never gave the OK to refill for 30 days with a note to the pharmacy that patient needs an appointment for further refills.

## 2018-07-29 ENCOUNTER — Other Ambulatory Visit: Payer: Self-pay | Admitting: Family Medicine

## 2018-07-29 NOTE — Telephone Encounter (Signed)
Last Rx for Trazodone given on 11/4 for #30 with no ref

## 2018-07-29 NOTE — Telephone Encounter (Signed)
Rx done. 

## 2018-07-29 NOTE — Telephone Encounter (Signed)
Refill OK

## 2018-07-30 ENCOUNTER — Other Ambulatory Visit: Payer: Self-pay | Admitting: Family Medicine

## 2018-08-16 ENCOUNTER — Other Ambulatory Visit: Payer: Self-pay | Admitting: Family Medicine

## 2018-09-09 NOTE — Telephone Encounter (Signed)
Patient scheduled for physical appointment with PCP for

## 2018-09-10 ENCOUNTER — Other Ambulatory Visit: Payer: Self-pay | Admitting: Family Medicine

## 2018-09-11 ENCOUNTER — Other Ambulatory Visit: Payer: Self-pay | Admitting: Family Medicine

## 2018-09-19 ENCOUNTER — Other Ambulatory Visit: Payer: Self-pay | Admitting: Family Medicine

## 2018-09-21 NOTE — Telephone Encounter (Signed)
Refilled of 14 caps. Courtesy refilled  until his appointment on 10/05/2018.  Requested Prescriptions  Pending Prescriptions Disp Refills  . tamsulosin (FLOMAX) 0.4 MG CAPS capsule [Pharmacy Med Name: TAMSULOSIN HCL 0.4 MG CAPSULE] 14 capsule 0    Sig: TAKE 1 CAPSULE BY MOUTH EVERY DAY     Urology: Alpha-Adrenergic Blocker Passed - 09/21/2018  8:59 AM      Passed - Last BP in normal range    BP Readings from Last 1 Encounters:  02/17/18 114/74         Passed - Valid encounter within last 12 months    Recent Outpatient Visits          7 months ago [facility]cturia   [facility] Elberta FortisBruce W, MD   1 year ago Essential hypertension   [facility], Elberta FortisBruce W, MD   1 year ago Physical exam   [facility], Elberta FortisBruce W, MD   1 year ago Depression, major, single episode, severe (HCC)   [facility], Elberta FortisBruce W, MD   2 years ago Left foot pain   [facility], Elberta FortisBruce W, MD      Future Appointments            In 2 weeks Burchette, Elberta FortisBruce W, [facility]         . clopidogrel (PLAVIX) 75 MG tablet [Pharmacy Med Name: CLOPIDOGREL 75 MG TABLET] 90 tablet 1    Sig: TAKE 1 TABLET BY MOUTH EVERY DAY     Hematology: Antiplatelets - clopidogrel Failed - 09/21/2018  8:59 AM      Failed - Evaluate AST, ALT within 2 months of therapy initiation.      Failed - ALT in normal range and within 360 days    ALT  Date Value Ref Range Status  10/21/2016 31 0 - 53 U/L Final         Failed - AST in normal range and within 360 days    AST  Date Value Ref Range Status  10/21/2016 23 0 - 37 U/L Final         Failed - HCT in normal range and within 180 days    HCT  Date Value Ref Range Status  10/21/2016 46.6 39.0 - 52.0 % Final         Failed - HGB in normal range and within 180 days    Hemoglobin  Date Value Ref Range Status  10/21/2016  16.1 13.0 - 17.0 g/dL Final         Failed - PLT in normal range and within 180 days    Platelets  Date Value Ref Range Status  10/21/2016 221.0 150.0 - 400.0 K/uL Final         Failed - Valid encounter within last 6 months    Recent Outpatient Visits          7 months ago Nocturia   [facility], Elberta FortisBruce W, MD   1 year ago Essential hypertension   [facility], Elberta FortisBruce W, MD   1 year ago Physical exam   [place], Elberta FortisBruce W, MD   1 year ago Depression, major, single episode, severe (HCC)   [facility], Elberta FortisBruce W, MD   2 years ago Left foot pain   [facility], Elberta FortisBruce W, MD  Future Appointments            In 2 weeks Burchette, Elberta FortisBruce W, [facility]         . chlorthalidone (HYGROTON) 25 MG tablet [Pharmacy Med Name: CHLORTHALIDONE 25 MG TABLET] 90 tablet 1    Sig: TAKE 1 TABLET BY MOUTH EVERY DAY. PLEASE SCHEDULE PHYSICAL FOR REFILLS     Cardiovascular: Diuretics - Thiazide Failed - 09/21/2018  8:59 AM      Failed - Ca in normal range and within 360 days    Calcium  Date Value Ref Range Status  10/21/2016 9.6 8.4 - 10.5 mg/dL Final         Failed - Cr in normal range and within 360 days    Creatinine, Ser  Date Value Ref Range Status  10/21/2016 1.02 0.40 - 1.50 mg/dL Final         Failed - K in normal range and within 360 days    Potassium  Date Value Ref Range Status  10/21/2016 4.0 3.5 - 5.1 mEq/L Final         Failed - Na in normal range and within 360 days    Sodium  Date Value Ref Range Status  10/21/2016 137 135 - 145 mEq/L Final         Failed - Valid encounter within last 6 months    Recent Outpatient Visits          7 months ago Nocturia   [facility], Elberta FortisBruce W, MD   1 year ago Essential hypertension   [facility]  Burchette, Elberta FortisBruce W, MD   1 year ago Physical exam   [facility], Elberta FortisBruce W, MD   1 year ago Depression, major, single episode, severe (HCC)   [facility], Elberta FortisBruce W, MD   2 years ago Left foot pain   [facility], Elberta FortisBruce W, MD      Future Appointments            In 2 weeks Burchette, Elberta FortisBruce W, [facility] [place]           Passed - Last BP in normal range    BP Readings from Last 1 Encounters:  02/17/18 114/74       . atorvastatin (LIPITOR) 80 MG tablet [Pharmacy Med Name: ATORVASTATIN 80 MG TABLET] 90 tablet 1    Sig: TAKE 1 TABLET BY MOUTH EVERY DAY     Cardiovascular:  Antilipid - Statins Failed - 09/21/2018  8:59 AM      Failed - Total Cholesterol in normal range and within 360 days    Cholesterol  Date Value Ref Range Status  10/21/2016 121 0 - 200 mg/dL Final    Comment:    ATP III Classification       Desirable:  < 200 mg/dL               Borderline High:  200 - 239 mg/dL          High:  > = 161240 mg/dL         Failed - LDL in normal range and within 360 days    LDL Cholesterol  Date Value Ref Range Status  10/21/2016 63 0 - 99 mg/dL Final         Failed - HDL in normal range and within 360 days    HDL  Date Value  Ref Range Status  10/21/2016 37.40 (L) >39.00 mg/dL Final         Failed - Triglycerides in normal range and within 360 days    Triglycerides  Date Value Ref Range Status  10/21/2016 104.0 0.0 - 149.0 mg/dL Final    Comment:    Normal:  <150 mg/dLBorderline High:  150 - 199 mg/dL         Passed - Patient is not pregnant      Passed - Valid encounter within last 12 months    Recent Outpatient Visits          7 months ago Nocturia   [facility], Elberta Fortis, MD   1 year ago Essential hypertension   [facility], Elberta Fortis, MD   1 year ago Physical exam   [facility] at  [facility] [facility], Elberta Fortis, MD   1 year ago Depression, major, single episode, severe (HCC)   [facility], Elberta Fortis, MD   2 years ago Left foot pain   [facility], Elberta Fortis, MD      Future Appointments            In 2 weeks Burchette, Elberta Fortis, MD [facility] [place], [facility]         . sertraline (ZOLOFT) 100 MG tablet [Pharmacy Med Name: SERTRALINE HCL 100 MG TABLET] 90 tablet 1    Sig: TAKE 1 TABLET BY MOUTH EVERY DAY. PLEASE SCHEDULE AN APPOINTMENT FOR REFILLS     Psychiatry:  Antidepressants - SSRI Failed - 09/21/2018  8:59 AM      Failed - Completed PHQ-2 or PHQ-9 in the last 360 days.      Failed - Valid encounter within last 6 months    Recent Outpatient Visits          7 months ago Nocturia   [facility], Elberta Fortis, MD   1 year ago Essential hypertension   [facility], Elberta Fortis, MD   1 year ago Physical exam   [facility], Elberta Fortis, MD   1 year ago Depression, major, single episode, severe (HCC)   [facility], Elberta Fortis, MD   2 years ago Left foot pain   [facility], Elberta Fortis, MD      Future Appointments            In 2 weeks Burchette, Elberta Fortis, MD [facility] [place], [facility]

## 2018-09-21 NOTE — Telephone Encounter (Signed)
Copied from CRM [id number]. Topic: Quick Communication - Rx Refill/Question >> Sep 21, 2018  8:38 AM Jesse Pacheco wrote: Medication: chlorthalidone (HYGROTON) 25 MG tablet atorvastatin (LIPITOR) 80 MG tablet clopidogrel (PLAVIX) 75 MG tablet tamsulosin (FLOMAX) 0.4 MG CAPS capsule  Jesse Pacheco has scheduled his cpe for 10/05/2018, but states he has none of these meds left.  Asking for enough to get him through to this appt. Pt has moved to [place] and unable to get here until this date [phone] [place], [place] - [address] [place] [address] [phone] (Phone) [phone] (Fax)

## 2018-09-21 NOTE — Telephone Encounter (Signed)
Requested medication (s) are due for refill today: yes for clopidogrel, hygroton, Lipitor but not for Zoloft.  Requested medication (s) are on the active medication list: yes  Last refill:  08/03/18 for clopidogrel, hygroton, Lipitor. 09/11/2018 for Zoloft.  Future visit scheduled: yes  10/05/2018  Notes to clinic:  Clopidogrel failed, thiazide failed, statins failed and SSRI failed.  Requested Prescriptions  Pending Prescriptions Disp Refills   clopidogrel (PLAVIX) 75 MG tablet [Pharmacy Med Name: CLOPIDOGREL 75 MG TABLET] 90 tablet 1    Sig: TAKE 1 TABLET BY MOUTH EVERY DAY     Hematology: Antiplatelets - clopidogrel Failed - 09/21/2018  8:59 AM      Failed - Evaluate AST, ALT within 2 months of therapy initiation.      Failed - ALT in normal range and within 360 days    ALT  Date Value Ref Range Status  10/21/2016 31 0 - 53 U/L Final         Failed - AST in normal range and within 360 days    AST  Date Value Ref Range Status  10/21/2016 23 0 - 37 U/L Final         Failed - HCT in normal range and within 180 days    HCT  Date Value Ref Range Status  10/21/2016 46.6 39.0 - 52.0 % Final         Failed - HGB in normal range and within 180 days    Hemoglobin  Date Value Ref Range Status  10/21/2016 16.1 13.0 - 17.0 g/dL Final         Failed - PLT in normal range and within 180 days    Platelets  Date Value Ref Range Status  10/21/2016 221.0 150.0 - 400.0 K/uL Final         Failed - Valid encounter within last 6 months    Recent Outpatient Visits          7 months ago Nocturia   [facility], Elberta FortisBruce W, MD   1 year ago Essential hypertension   [facility], Elberta FortisBruce W, MD   1 year ago Physical exam   [facility], Elberta FortisBruce W, MD   1 year ago Depression, major, single episode, severe (HCC)   [facility], Elberta FortisBruce W, MD   2 years ago Left foot pain   [facility] Bur[facility]te, Elberta FortisBruce W, MD      Future Appointments            In 2 weeks Burchette, Elberta FortisBruce W, MD [facility]          chlorthalidone (HYGROTON) 25 MG tablet [Pharmacy Med Name: CHLORTHALIDONE 25 MG TABLET] 90 tablet 1    Sig: TAKE 1 TABLET BY MOUTH EVERY DAY. PLEASE SCHEDULE PHYSICAL FOR REFILLS     Cardiovascular: Diuretics - Thiazide Failed - 09/21/2018  8:59 AM      Failed - Ca in normal range and within 360 days    Calcium  Date Value Ref Range Status  10/21/2016 9.6 8.4 - 10.5 mg/dL Final         Failed - Cr in normal range and within 360 days    Creatinine, Ser  Date Value Ref Range Status  10/21/2016 1.02 0.40 - 1.50 mg/dL Final         Failed - K in normal range and within 360 days    Potassium  Date  Value Ref Range Status  10/21/2016 4.0 3.5 - 5.1 mEq/L Final         Failed - Na in normal range and within 360 days    Sodium  Date Value Ref Range Status  10/21/2016 137 135 - 145 mEq/L Final         Failed - Valid encounter within last 6 months    Recent Outpatient Visits          7 months ago Nocturia   [facility], Elberta Fortis, MD   1 year ago Essential hypertension   [facility] at [facility] [employer], Elberta Fortis, MD   1 year ago Physical exam   [facility], Elberta Fortis, MD   1 year ago Depression, major, single episode, severe (HCC)   [facility] [employer], Elberta Fortis, MD   2 years ago Left foot pain   [facility] [employer], Elberta Fortis, MD      Future Appointments            In 2 weeks Burchette, Elberta Fortis, MD [facility] [place], [facility]           Passed - Last BP in normal range    BP Readings from Last 1 Encounters:  02/17/18 114/74        atorvastatin (LIPITOR) 80 MG tablet [Pharmacy Med Name: ATORVASTATIN 80 MG TABLET] 90 tablet 1    Sig: TAKE 1 TABLET BY MOUTH EVERY DAY      Cardiovascular:  Antilipid - Statins Failed - 09/21/2018  8:59 AM      Failed - Total Cholesterol in normal range and within 360 days    Cholesterol  Date Value Ref Range Status  10/21/2016 121 0 - 200 mg/dL Final    Comment:    ATP III Classification       Desirable:  < 200 mg/dL               Borderline High:  200 - 239 mg/dL          High:  > = 026 mg/dL         Failed - LDL in normal range and within 360 days    LDL Cholesterol  Date Value Ref Range Status  10/21/2016 63 0 - 99 mg/dL Final         Failed - HDL in normal range and within 360 days    HDL  Date Value Ref Range Status  10/21/2016 37.40 (L) >39.00 mg/dL Final         Failed - Triglycerides in normal range and within 360 days    Triglycerides  Date Value Ref Range Status  10/21/2016 104.0 0.0 - 149.0 mg/dL Final    Comment:    Normal:  <150 mg/dLBorderline High:  150 - 199 mg/dL         Passed - Patient is not pregnant      Passed - Valid encounter within last 12 months    Recent Outpatient Visits          7 months ago Nocturia   [facility], Elberta Fortis, MD   1 year ago Essential hypertension   [facility], Elberta Fortis, MD   1 year ago Physical exam   [facility], Elberta Fortis, MD   1 year ago Depression, major, single episode, severe (HCC)   [facility], [place],  MD   2 years ago Left foot pain   [facility], Elberta Fortis, MD      Future Appointments            In 2 weeks Burchette, Elberta Fortis, MD [facility] [place], [facility]          sertraline (ZOLOFT) 100 MG tablet [Pharmacy Med Name: SERTRALINE HCL 100 MG TABLET] 90 tablet 1    Sig: TAKE 1 TABLET BY MOUTH EVERY DAY. PLEASE SCHEDULE AN APPOINTMENT FOR REFILLS     Psychiatry:  Antidepressants - SSRI Failed - 09/21/2018  8:59 AM      Failed - Completed PHQ-2 or PHQ-9 in the last 360 days.      Failed  - Valid encounter within last 6 months    Recent Outpatient Visits          7 months ago Nocturia   [facility], Elberta Fortis, MD   1 year ago Essential hypertension   [facility] [employer], Elberta Fortis, MD   1 year ago Physical exam   [place], Elberta Fortis, MD   1 year ago Depression, major, single episode, severe (HCC)   [facility], Elberta Fortis, MD   2 years ago Left foot pain   [facility] [employer], Elberta Fortis, MD      Future Appointments            In 2 weeks Burchette, Elberta Fortis, MD [facility] [place], [facility]         Signed Prescriptions Disp Refills   tamsulosin (FLOMAX) 0.4 MG CAPS capsule 14 capsule 0    Sig: TAKE 1 CAPSULE BY MOUTH EVERY DAY     Urology: Alpha-Adrenergic Blocker Passed - 09/21/2018  8:59 AM      Passed - Last BP in normal range    BP Readings from Last 1 Encounters:  02/17/18 114/74         Passed - Valid encounter within last 12 months    Recent Outpatient Visits          7 months ago Nocturia   [facility], Elberta Fortis, MD   1 year ago Essential hypertension   [facility] [employer], Elberta Fortis, MD   1 year ago Physical exam   [place] [facility], Elberta Fortis, MD   1 year ago Depression, major, single episode, severe (HCC)   [facility] [employer], Elberta Fortis, MD   2 years ago Left foot pain   [facility] at [employer], Elberta Fortis, MD      Future Appointments            In 2 weeks Burchette, Elberta Fortis, MD [facility] [place], [facility]

## 2018-10-05 ENCOUNTER — Other Ambulatory Visit: Payer: Self-pay

## 2018-10-05 ENCOUNTER — Ambulatory Visit (INDEPENDENT_AMBULATORY_CARE_PROVIDER_SITE_OTHER): Payer: Commercial Managed Care - PPO | Admitting: Family Medicine

## 2018-10-05 ENCOUNTER — Encounter: Payer: Self-pay | Admitting: Family Medicine

## 2018-10-05 VITALS — BP 122/82 | HR 89 | Temp 98.3°F | Ht 66.25 in | Wt 197.0 lb

## 2018-10-05 DIAGNOSIS — Z23 Encounter for immunization: Secondary | ICD-10-CM

## 2018-10-05 DIAGNOSIS — Z0000 Encounter for general adult medical examination without abnormal findings: Secondary | ICD-10-CM | POA: Diagnosis not present

## 2018-10-05 LAB — CBC WITH DIFFERENTIAL/PLATELET
Basophils Absolute: 0.1 10*3/uL (ref 0.0–0.1)
Basophils Relative: 1 % (ref 0.0–3.0)
Eosinophils Absolute: 0.4 10*3/uL (ref 0.0–0.7)
Eosinophils Relative: 6.4 % — ABNORMAL HIGH (ref 0.0–5.0)
HCT: 48.1 % (ref 39.0–52.0)
HEMOGLOBIN: 16.7 g/dL (ref 13.0–17.0)
Lymphocytes Relative: 18.6 % (ref 12.0–46.0)
Lymphs Abs: 1 10*3/uL (ref 0.7–4.0)
MCHC: 34.8 g/dL (ref 30.0–36.0)
MCV: 91.3 fl (ref 78.0–100.0)
MONO ABS: 0.7 10*3/uL (ref 0.1–1.0)
Monocytes Relative: 12.3 % — ABNORMAL HIGH (ref 3.0–12.0)
Neutro Abs: 3.4 10*3/uL (ref 1.4–7.7)
Neutrophils Relative %: 61.7 % (ref 43.0–77.0)
Platelets: 234 10*3/uL (ref 150.0–400.0)
RBC: 5.27 Mil/uL (ref 4.22–5.81)
RDW: 13.1 % (ref 11.5–15.5)
WBC: 5.5 10*3/uL (ref 4.0–10.5)

## 2018-10-05 LAB — HEPATIC FUNCTION PANEL
ALT: 26 U/L (ref 0–53)
AST: 22 U/L (ref 0–37)
Albumin: 4.6 g/dL (ref 3.5–5.2)
Alkaline Phosphatase: 60 U/L (ref 39–117)
Bilirubin, Direct: 0.2 mg/dL (ref 0.0–0.3)
Total Bilirubin: 0.7 mg/dL (ref 0.2–1.2)
Total Protein: 7.1 g/dL (ref 6.0–8.3)

## 2018-10-05 LAB — BASIC METABOLIC PANEL
BUN: 16 mg/dL (ref 6–23)
CO2: 31 mEq/L (ref 19–32)
Calcium: 9.9 mg/dL (ref 8.4–10.5)
Chloride: 97 mEq/L (ref 96–112)
Creatinine, Ser: 0.96 mg/dL (ref 0.40–1.50)
GFR: 80.08 mL/min (ref >60.00)
Glucose, Bld: 92 mg/dL (ref 70–99)
Potassium: 4.2 mEq/L (ref 3.5–5.1)
Sodium: 136 mEq/L (ref 135–145)

## 2018-10-05 LAB — TSH: TSH: 1.42 u[IU]/mL (ref 0.35–4.50)

## 2018-10-05 LAB — LIPID PANEL
CHOLESTEROL: 131 mg/dL (ref 0–200)
HDL: 46.1 mg/dL (ref >39.00)
LDL Cholesterol: 70 mg/dL (ref 0–99)
NonHDL: 84.99
Total CHOL/HDL Ratio: 3
Triglycerides: 75 mg/dL (ref 0.0–149.0)
VLDL: 15 mg/dL (ref 0.0–40.0)

## 2018-10-05 LAB — PSA: PSA: 2.33 ng/mL (ref 0.10–4.00)

## 2018-10-05 MED ORDER — CLOPIDOGREL BISULFATE 75 MG PO TABS: 75.0000 mg | ORAL_TABLET | Freq: Every day | ORAL | 3 refills | Status: DC

## 2018-10-05 MED ORDER — LISINOPRIL 40 MG PO TABS: 40.0000 mg | ORAL_TABLET | Freq: Every day | ORAL | 3 refills | Status: DC

## 2018-10-05 MED ORDER — TAMSULOSIN HCL 0.4 MG PO CAPS: 0.4000 mg | ORAL_CAPSULE | Freq: Every day | ORAL | 3 refills | Status: DC

## 2018-10-05 MED ORDER — ATORVASTATIN CALCIUM 80 MG PO TABS: 80.0000 mg | ORAL_TABLET | Freq: Every day | ORAL | 3 refills | Status: DC

## 2018-10-05 MED ORDER — CHLORTHALIDONE 25 MG PO TABS: ORAL_TABLET | ORAL | 3 refills | Status: DC

## 2018-10-05 MED ORDER — FINASTERIDE 5 MG PO TABS: 5.0000 mg | ORAL_TABLET | Freq: Every day | ORAL | 3 refills | Status: DC

## 2018-10-05 NOTE — Addendum Note (Signed)
Addended by: Ronita Hipps on: 10/05/2018 10:28 AM   Modules accepted: Orders

## 2018-10-05 NOTE — Progress Notes (Signed)
Subjective:     Patient ID: Jesse Pacheco, male   DOB: 01-02-1961, 58 y.o.   MRN: [id number]  HPI Patient is seen for physical exam  Chronic problems include history of hypertension, past history of CVA, osteoarthritis, BPH, hyperlipidemia.  He has also had past history of recurrent depression.  He took himself off sertraline and feels stable at this time.  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  [place].  Most of his work is in that area.  He will be establishing with new primary at some point this year there  Needs follow-up colonoscopy this year.  Also no history of shingles vaccine.  Tetanus is due.  He does not recall getting flu vaccine yet.  No history of hepatitis C screening.  Needs refills of all medications today  Past Medical History:  Diagnosis Date  . Depression   . HYPERTENSION 01/09/2009  . 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 Nocturia 01/09/2009  . VIRAL URI 03/22/2010   Past Surgical History:  Procedure Laterality Date  . APPENDECTOMY    . LOOP RECORDER IMPLANT N/A 11/29/2014   Procedure: LOOP RECORDER IMPLANT;  Surgeon: Hillis RangeJames Allred, MD;  [facility];  Service: Cardiovascular;  Laterality: N/A;  . SPINE SURGERY  2006, 2009   laminectomy  . TEE WITHOUT CARDIOVERSION N/A 11/29/2014   Procedure: TRANSESOPHAGEAL ECHOCARDIOGRAM (TEE);  Surgeon: Jake BatheMark C Skains, MD;  [facility];  Service: Cardiovascular;  Laterality: N/A;    reports that he has never smoked. He has never used smokeless tobacco. He reports current alcohol use. He reports that he does not use drugs. family history includes Heart disease (age of onset: 3258) in his father; Hypertension in his mother; Stroke in his mother. No Known Allergies   Review of Systems  Constitutional: Negative for activity change, appetite change, fatigue and fever.  HENT: Negative for congestion, ear pain and trouble swallowing.   Eyes: Negative for  pain and visual disturbance.  Respiratory: Negative for cough, shortness of breath and wheezing.   Cardiovascular: Negative for chest pain and palpitations.  Gastrointestinal: Negative for abdominal distention, abdominal pain, blood in stool, constipation, diarrhea, nausea, rectal pain and vomiting.  Endocrine: Negative for polydipsia and polyuria.  Genitourinary: Negative for dysuria, hematuria and testicular pain.  Musculoskeletal: Negative for arthralgias and joint swelling.  Skin: Negative for rash.  Neurological: Negative for dizziness, syncope and headaches.  Hematological: Negative for adenopathy.  Psychiatric/Behavioral: Negative for confusion and dysphoric mood.       Objective:   Physical Exam Constitutional:      General: He is not in acute distress.    Appearance: He is well-developed.  HENT:     Head: Normocephalic and atraumatic.     Right Ear: External ear normal.     Left Ear: External ear normal.  Eyes:     Conjunctiva/sclera: Conjunctivae normal.     Pupils: Pupils are equal, round, and reactive to light.  Neck:     Musculoskeletal: Normal range of motion and neck supple.     Thyroid: No thyromegaly.  Cardiovascular:     Rate and Rhythm: Normal rate and regular rhythm.     Heart sounds: Normal heart sounds. No murmur.  Pulmonary:     Effort: No respiratory distress.     Breath sounds: No wheezing or rales.  Abdominal:     General: Bowel sounds are normal. There is no distension.  Palpations: Abdomen is soft. There is no mass.     Tenderness: There is no abdominal tenderness. There is no guarding or rebound.  Lymphadenopathy:     Cervical: No cervical adenopathy.  Skin:    Findings: No rash.  Neurological:     Mental Status: He is alert and oriented to person, place, and time.     Cranial Nerves: No cranial nerve deficit.     Deep Tendon Reflexes: Reflexes normal.        Assessment:     Physical exam.  Patient has several chronic medical  problems as above but overall stable.  We discussed several health maintenance issues as below    Plan:     -Flu vaccine and tetanus booster given -Check hepatitis C antibody -Obtain screening labs -Refill all medications for 1 year -He will need follow-up colonoscopy this year.  He wishes to wait until he settles in his new practice before he pursues that -We discussed Shingrix vaccine and he will also check with his new primary care provider once he gets established in [place]  Jesse Rutan W Keirstan Iannello MD [facility]

## 2018-10-05 NOTE — Patient Instructions (Signed)
Recommend follow up colonoscopy when you get settled in [place] , [place]  Consider Shingrix vaccine at some point this year.

## 2018-10-09 ENCOUNTER — Encounter: Payer: BLUE CROSS/BLUE SHIELD | Admitting: Family Medicine

## 2018-10-09 LAB — HEPATITIS C ANTIBODY
HEP C AB: NONREACTIVE
SIGNAL TO CUT-OFF: 0.01 (ref <1.00)

## 2018-10-11 ENCOUNTER — Other Ambulatory Visit: Payer: Self-pay | Admitting: Family Medicine

## 2018-11-16 ENCOUNTER — Other Ambulatory Visit: Payer: Self-pay | Admitting: Family Medicine

## 2018-11-16 NOTE — Telephone Encounter (Signed)
Copied from CRM [id number]. Topic: Quick Communication - Rx Refill/Question >> Nov 16, 2018 12:50 PM Maia Petties wrote: Medication: sildenafil (VIAGRA) 100 MG tablet - pt is out of medication  Has the patient contacted their pharmacy? Yes - RX expired Preferred Pharmacy (with phone number or street name): [phone] [place], [place] - [address] [phone] (Phone) [phone] (Fax)

## 2018-11-17 MED ORDER — SILDENAFIL CITRATE 100 MG PO TABS: ORAL_TABLET | ORAL | 99 refills | Status: DC

## 2019-02-06 ENCOUNTER — Other Ambulatory Visit: Payer: Self-pay | Admitting: Family Medicine

## 2019-05-10 ENCOUNTER — Other Ambulatory Visit: Payer: Self-pay | Admitting: Family Medicine

## 2019-05-21 ENCOUNTER — Other Ambulatory Visit: Payer: Self-pay

## 2019-05-21 ENCOUNTER — Telehealth: Payer: Self-pay | Admitting: Family Medicine

## 2019-05-21 MED ORDER — TRAZODONE HCL 50 MG PO TABS: 50.0000 mg | ORAL_TABLET | Freq: Every day | ORAL | 0 refills | Status: DC

## 2019-05-21 NOTE — Telephone Encounter (Signed)
Pt had his Rx for traZODone (DESYREL) 50 MG tablet Put on hold and now he would like it renewed and sent to  [id number] [place], [place] - [address] [phone] (Phone) [phone] (Fax)   Please advise

## 2019-05-21 NOTE — Telephone Encounter (Signed)
Medication has been sent to pharmacy requested. °

## 2019-07-20 ENCOUNTER — Ambulatory Visit: Payer: Commercial Managed Care - PPO | Admitting: Family Medicine

## 2019-07-23 ENCOUNTER — Other Ambulatory Visit: Payer: Self-pay

## 2019-07-26 ENCOUNTER — Ambulatory Visit: Payer: Self-pay | Admitting: Family Medicine

## 2019-07-26 ENCOUNTER — Other Ambulatory Visit: Payer: Self-pay

## 2019-07-27 ENCOUNTER — Encounter: Payer: Self-pay | Admitting: Family Medicine

## 2019-07-27 ENCOUNTER — Ambulatory Visit (INDEPENDENT_AMBULATORY_CARE_PROVIDER_SITE_OTHER): Payer: 59 | Admitting: Family Medicine

## 2019-07-27 VITALS — BP 108/70 | HR 78 | Temp 97.5°F | Ht 66.25 in | Wt 211.9 lb

## 2019-07-27 DIAGNOSIS — F339 Major depressive disorder, recurrent, unspecified: Secondary | ICD-10-CM | POA: Diagnosis not present

## 2019-07-27 DIAGNOSIS — Z23 Encounter for immunization: Secondary | ICD-10-CM | POA: Diagnosis not present

## 2019-07-27 DIAGNOSIS — Z9889 Other specified postprocedural states: Secondary | ICD-10-CM | POA: Diagnosis not present

## 2019-07-27 MED ORDER — BUPROPION HCL ER (XL) 300 MG PO TB24: 300.0000 mg | ORAL_TABLET | Freq: Every day | ORAL | 5 refills | Status: DC

## 2019-07-27 NOTE — Progress Notes (Signed)
  Subjective:     Patient ID: Jesse Pacheco, male   DOB: 1961/08/28, 58 y.o.   MRN: [id number]  HPI  History of hypertension, hyperlipidemia, CVA.  He has had past history of depression and has taken Zoloft in the past which did help his depression but he had decreased libido and sexual dysfunction.  He stopped this by weaning off himself several weeks ago.  He has seen increase in depression symptoms since then.  He has had several bouts of recurrent depression.  Denies any suicidal ideation.  He would like to explore other options.  He does not recall other antidepressant use previously.  He has noted manifestations in terms of decreased motivation, decreased energy, loss of interest in hobbies.  He had loop recorder placed following his stroke several years ago.  He would like to consult with cardiology about getting this removed.  Past Medical History:  Diagnosis Date  . Depression   . HYPERTENSION 01/09/2009  . 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 Nocturia 01/09/2009  . VIRAL URI 03/22/2010   Past Surgical History:  Procedure Laterality Date  . APPENDECTOMY    . LOOP RECORDER IMPLANT N/A 11/29/2014   Procedure: LOOP RECORDER IMPLANT;  Surgeon: Thompson Grayer, MD;  Location: [facility] CATH LAB;  Service: Cardiovascular;  Laterality: N/A;  . SPINE SURGERY  2006, 2009   laminectomy  . TEE WITHOUT CARDIOVERSION N/A 11/29/2014   Procedure: TRANSESOPHAGEAL ECHOCARDIOGRAM (TEE);  Surgeon: Jerline Pain, MD;  [facility];  Service: Cardiovascular;  Laterality: N/A;    reports that he has never smoked. He has never used smokeless tobacco. He reports current alcohol use. He reports that he does not use drugs. family history includes Heart disease (age of onset: 65) in his father; Hypertension in his mother; Stroke in his mother. No Known Allergies   Review of Systems  Constitutional: Negative for appetite change and unexpected weight change.  Respiratory: Negative for shortness of  breath.   Cardiovascular: Negative for chest pain.  Neurological: Negative for seizures.  Psychiatric/Behavioral: Positive for dysphoric mood. Negative for agitation, confusion and suicidal ideas. The patient is nervous/anxious.        Objective:   Physical Exam Vitals signs reviewed.  Constitutional:      Appearance: Normal appearance.  Cardiovascular:     Rate and Rhythm: Normal rate and regular rhythm.  Pulmonary:     Effort: Pulmonary effort is normal.     Breath sounds: Normal breath sounds.  Neurological:     General: No focal deficit present.     Mental Status: He is alert and oriented to person, place, and time.  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  Patient has loop recorder in place and is requesting consultation with cardiology for removal    Plan:     -Set up cardiology referral -We discussed trial of Wellbutrin XL 300 mg once daily.  We reviewed potential side effects.  Recommend 3-week follow-up to further assess. -Flu vaccine given  [name] MD [facility] at [facility]

## 2019-07-27 NOTE — Patient Instructions (Addendum)
Bupropion extended-release tablets (Depression/Mood Disorders) What is this medicine? BUPROPION (byoo PROE pee on) is used to treat depression. This medicine may be used for other purposes; ask your health care provider or pharmacist if you have questions. COMMON BRAND NAME(S): Aplenzin, Budeprion XL, Forfivo XL, Wellbutrin XL What should I tell my health care provider before I take this medicine? They need to know if you have any of these conditions:  an eating disorder, such as anorexia or bulimia  bipolar disorder or psychosis  diabetes or high blood sugar, treated with medication  glaucoma  head injury or brain tumor  heart disease, previous heart attack, or irregular heart beat  high blood pressure  kidney or liver disease  seizures (convulsions)  suicidal thoughts or a previous suicide attempt  Tourette's syndrome  weight loss  an unusual or allergic reaction to bupropion, other medicines, foods, dyes, or preservatives  breast-feeding  pregnant or trying to become pregnant How should I use this medicine? Take this medicine by mouth with a glass of water. Follow the directions on the prescription label. You can take it with or without food. If it upsets your stomach, take it with food. Do not crush, chew, or cut these tablets. This medicine is taken once daily at the same time each day. Do not take your medicine more often than directed. Do not stop taking this medicine suddenly except upon the advice of your doctor. Stopping this medicine too quickly may cause serious side effects or your condition may worsen. A special MedGuide will be given to you by the pharmacist with each prescription and refill. Be sure to read this information carefully each time. Talk to your pediatrician regarding the use of this medicine in children. Special care may be needed. Overdosage: If you think you have taken too much of this medicine contact a poison control center or emergency room  at once. NOTE: This medicine is only for you. Do not share this medicine with others. What if I miss a dose? If you miss a dose, skip the missed dose and take your next tablet at the regular time. Do not take double or extra doses. What may interact with this medicine? Do not take this medicine with any of the following medications:  linezolid  MAOIs like Azilect, Carbex, Eldepryl, Marplan, Nardil, and Parnate  methylene blue (injected into a vein)  other medicines that contain bupropion like Zyban This medicine may also interact with the following medications:  alcohol  certain medicines for anxiety or sleep  certain medicines for blood pressure like metoprolol, propranolol  certain medicines for depression or psychotic disturbances  certain medicines for HIV or AIDS like efavirenz, lopinavir, nelfinavir, ritonavir  certain medicines for irregular heart beat like propafenone, flecainide  certain medicines for Parkinson's disease like amantadine, levodopa  certain medicines for seizures like carbamazepine, phenytoin, phenobarbital  cimetidine  clopidogrel  cyclophosphamide  digoxin  furazolidone  isoniazid  nicotine  orphenadrine  procarbazine  steroid medicines like prednisone or cortisone  stimulant medicines for attention disorders, weight loss, or to stay awake  tamoxifen  theophylline  thiotepa  ticlopidine  tramadol  warfarin This list may not describe all possible interactions. Give your health care provider a list of all the medicines, herbs, non-prescription drugs, or dietary supplements you use. Also tell them if you smoke, drink alcohol, or use illegal drugs. Some items may interact with your medicine. What should I watch for while using this medicine? Tell your doctor if your symptoms do   not get better or if they get worse. Visit your doctor or healthcare provider for regular checks on your progress. Because it may take several weeks to  see the full effects of this medicine, it is important to continue your treatment as prescribed by your doctor. This medicine may cause serious skin reactions. They can happen weeks to months after starting the medicine. Contact your healthcare provider right away if you notice fevers or flu-like symptoms with a rash. The rash may be red or purple and then turn into blisters or peeling of the skin. Or, you might notice a red rash with swelling of the face, lips or lymph nodes in your neck or under your arms. Patients and their families should watch out for new or worsening thoughts of suicide or depression. Also watch out for sudden changes in feelings such as feeling anxious, agitated, panicky, irritable, hostile, aggressive, impulsive, severely restless, overly excited and hyperactive, or not being able to sleep. If this happens, especially at the beginning of treatment or after a change in dose, call your healthcare provider. Avoid alcoholic drinks while taking this medicine. Drinking large amounts of alcoholic beverages, using sleeping or anxiety medicines, or quickly stopping the use of these agents while taking this medicine may increase your risk for a seizure. Do not drive or use heavy machinery until you know how this medicine affects you. This medicine can impair your ability to perform these tasks. Do not take this medicine close to bedtime. It may prevent you from sleeping. Your mouth may get dry. Chewing sugarless gum or sucking hard candy, and drinking plenty of water may help. Contact your doctor if the problem does not go away or is severe. The tablet shell for some brands of this medicine does not dissolve. This is normal. The tablet shell may appear whole in the stool. This is not a cause for concern. What side effects may I notice from receiving this medicine? Side effects that you should report to your doctor or health care professional as soon as possible:  allergic reactions like  skin rash, itching or hives, swelling of the face, lips, or tongue  breathing problems  changes in vision  confusion  elevated mood, decreased need for sleep, racing thoughts, impulsive behavior  fast or irregular heartbeat  hallucinations, loss of contact with reality  increased blood pressure  rash, fever, and swollen lymph nodes  redness, blistering, peeling or loosening of the skin, including inside the mouth  seizures  suicidal thoughts or other mood changes  unusually weak or tired  vomiting Side effects that usually do not require medical attention (report to your doctor or health care professional if they continue or are bothersome):  constipation  headache  loss of appetite  nausea  tremors  weight loss This list may not describe all possible side effects. Call your doctor for medical advice about side effects. You may report side effects to FDA at 1-800-FDA-1088. Where should I keep my medicine? Keep out of the reach of children. Store at room temperature between 15 and 30 degrees C (59 and 86 degrees F). Throw away any unused medicine after the expiration date. NOTE: This sheet is a summary. It may not cover all possible information. If you have questions about this medicine, talk to your doctor, pharmacist, or health care provider.  2020 Elsevier/Gold Standard (2018-11-12 13:45:31) Persistent Depressive Disorder, Adult Persistent depressive disorder (PDD) is a mental health condition that causes symptoms of low-level depression for 2 years or  longer. It may also be called long-term (chronic) depression or dysthymia. PDD may include episodes of more severe depression that last for about 2 weeks (major depressive disorder or MDD). PDD can affect the way you think, feel, and sleep. This condition may also affect your relationships. You may be more likely to get sick if you have PDD. What are the causes? The exact cause of this condition is not known. PDD is  most likely caused by a combination of things, which may include:  Genetic factors. These are traits that are passed along from parent to child.  Individual factors. Your personality, your behavior, and the way you handle your thoughts and feelings may contribute to PDD. This includes personality traits and behaviors learned from others.  Physical factors, such as: ? Differences in the part of your brain that controls emotion. This part of your brain may be different than it is in people who do not have PDD. ? Long-term (chronic) medical or psychiatric illnesses.  Social factors. Traumatic experiences or major life changes may play a role in the development of PDD. What increases the risk? This condition is more likely to develop in women. The following factors may make you more likely to develop PDD:  A family history of depression.  Abnormally low levels of certain brain chemicals.  Traumatic events in childhood, especially abuse or the loss of a parent.  Being under a lot of stress, or long-term stress, especially from upsetting life experiences or losses.  A history of: ? Chronic physical illness. ? Other mental health disorders. ? Substance abuse.  Poor living conditions.  Experiencing social exclusion or discrimination on a regular basis. What are the signs or symptoms? Symptoms of this condition occur for most of the day, and may include:  Fatigue or low energy.  Eating too much or too little.  Sleeping too much or too little.  Restlessness or agitation.  Feelings of hopelessness.  Feeling worthless or guilty.  Anxiety.  Poor concentration or difficulty making decisions.  Low self-esteem.  Negative outlook.  Inability to have fun or experience pleasure.  Social withdrawal.  Unexplained physical complaints.  Irritability.  Aggressive behavior or anger. How is this diagnosed? This condition may be diagnosed based on:  Your symptoms.  Your  medical history, including your mental health history. This may involve tests to evaluate your mental health. You may be asked questions about your lifestyle, including any drug and alcohol use, and how long you have had symptoms of PDD.  A physical exam.  Blood tests to rule out other conditions. You may be diagnosed with PDD if you have had a depressed mood for 2 years or longer, as well as other symptoms of depression. How is this treated? This condition is usually treated by mental health professionals, such as psychologists, psychiatrists, and clinical social workers. You may need more than one type of treatment. Treatment may include:  Psychotherapy. This is also called talk therapy or counseling. Types of psychotherapy include: ? Cognitive behavioral therapy (CBT). This type of therapy teaches you to recognize unhealthy feelings, thoughts, and behaviors, and replace them with positive thoughts and actions. ? Interpersonal therapy (IPT). This helps you to improve the way you relate to and communicate with others. ? Family therapy. This treatment includes members of your family.  Medicine to treat anxiety and depression, or to help you control certain emotions and behaviors.  Lifestyle changes, such as: ? Limiting alcohol and drug use. ? Exercising regularly. ? Getting  plenty of sleep. ? Making healthy eating choices. ? Spending more time outdoors.  Follow these instructions at home: Activity  Return to your normal activities as told by your health care provider.  Exercise regularly and spend time outdoors as told by your health care provider. General instructions  Take over-the-counter and prescription medicines only as told by your health care provider.  Do not drink alcohol. If you drink alcohol, limit your alcohol intake to no more than 1 drink a day for nonpregnant women and 2 drinks a day for men. One drink equals 12 oz of beer, 5 oz of wine, or 1 oz of hard liquor.  Alcohol can affect any antidepressant medicines you are taking. Talk to your health care provider about your alcohol use.  Eat a healthy diet and get plenty of sleep.  Find activities that you enjoy doing, and make time to do them.  Consider joining a support group. Your health care provider may be able to recommend a support group.  Keep all follow-up visits as told by your health care provider. This is important. Where to find more information [employer] on Mental Illness  www.nami.org [place] of Mental Health  http://www.maynard.net/ National Suicide Prevention Lifeline  1-800-273-TALK 567-522-6775). This is free, 24-hour help. Contact a health care provider if:  Your symptoms get worse.  You develop new symptoms.  You have trouble sleeping or doing your daily activities. Get help right away if:  You self-harm.  You have serious thoughts about hurting yourself or others.  You see, hear, taste, smell, or feel things that are not present (hallucinate). This information is not intended to replace advice given to you by your health care provider. Make sure you discuss any questions you have with your health care provider. Document Released: 08/05/2012 Document Revised: 08/01/2017 Document Reviewed: 03/02/2016 Elsevier Patient Education  2020 [employer].

## 2019-08-03 ENCOUNTER — Ambulatory Visit (INDEPENDENT_AMBULATORY_CARE_PROVIDER_SITE_OTHER): Payer: 59 | Admitting: Family Medicine

## 2019-08-03 ENCOUNTER — Encounter: Payer: Self-pay | Admitting: Family Medicine

## 2019-08-03 ENCOUNTER — Other Ambulatory Visit: Payer: Self-pay

## 2019-08-03 VITALS — BP 116/78 | HR 81 | Temp 98.0°F | Ht 66.25 in | Wt 208.0 lb

## 2019-08-03 DIAGNOSIS — F339 Major depressive disorder, recurrent, unspecified: Secondary | ICD-10-CM | POA: Diagnosis not present

## 2019-08-03 MED ORDER — FLUOXETINE HCL 20 MG PO TABS: 20.0000 mg | ORAL_TABLET | Freq: Every day | ORAL | 3 refills | Status: DC

## 2019-08-03 NOTE — Progress Notes (Signed)
  Subjective:     Patient ID: Jesse Pacheco, male   DOB: 04/01/1961, 58 y.o.   MRN: [id number]  HPI   Jesse Pacheco here for follow-up regarding recent med changes.  He has history of recurrent depression.  He had been on sertraline and was having decreased libido.  We ended up trying Wellbutrin XL 300 mg daily.  However, he had severe anxiety symptoms and stopped this after just a few days.  He feels his depression is currently stable but is concerned about going off antidepressants altogether.  Currently has no suicidal ideation  Past Medical History:  Diagnosis Date  . Depression   . HYPERTENSION 01/09/2009  . 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 Nocturia 01/09/2009  . VIRAL URI 03/22/2010   Past Surgical History:  Procedure Laterality Date  . APPENDECTOMY    . LOOP RECORDER IMPLANT N/A 11/29/2014   Procedure: LOOP RECORDER IMPLANT;  Surgeon: Thompson Grayer, MD;  Location: [facility] CATH LAB;  Service: Cardiovascular;  Laterality: N/A;  . SPINE SURGERY  2006, 2009   laminectomy  . TEE WITHOUT CARDIOVERSION N/A 11/29/2014   Procedure: TRANSESOPHAGEAL ECHOCARDIOGRAM (TEE);  Surgeon: Jerline Pain, MD;  Location: [facility] ENDOSCOPY;  Service: Cardiovascular;  Laterality: N/A;    reports that he has never smoked. He has never used smokeless tobacco. He reports current alcohol use. He reports that he does not use drugs. family history includes Heart disease (age of onset: 36) in his father; Hypertension in his mother; Stroke in his mother. No Known Allergies   Review of Systems  Constitutional: Negative for appetite change and unexpected weight change.  Psychiatric/Behavioral: Positive for dysphoric mood. Negative for agitation and suicidal ideas.       Objective:   Physical Exam Vitals signs reviewed.  Constitutional:      Appearance: Normal appearance.  Cardiovascular:     Rate and Rhythm: Normal rate and regular rhythm.  Neurological:     General: No focal deficit present.     Mental Status:  He is alert.  Psychiatric:        Mood and Affect: Mood normal.        Behavior: Behavior normal.        Assessment:     History of recurrent depression.  Recent decreased libido with SSRI.  He had anxiety symptoms with Wellbutrin.  We have suggested the following    Plan:     -Stay off Wellbutrin -Start fluoxetine 20 mg once daily -Give some feedback in 3 weeks regarding how his depression is doing and also whether he is noticing decreased libido.  If having decreased libido again on SSRI consider low-dose Wellbutrin 150 mg once daily  Jesse Post MD [facility]

## 2019-08-03 NOTE — Patient Instructions (Signed)
Let me know in about 3 weeks if experiencing any decreased libido with the Prozac- or, if the Prozac is not working.

## 2019-08-30 ENCOUNTER — Other Ambulatory Visit: Payer: Self-pay | Admitting: Family Medicine

## 2019-08-30 ENCOUNTER — Ambulatory Visit: Payer: 59 | Admitting: Internal Medicine

## 2019-09-13 ENCOUNTER — Ambulatory Visit (INDEPENDENT_AMBULATORY_CARE_PROVIDER_SITE_OTHER): Payer: 59 | Admitting: Internal Medicine

## 2019-09-13 ENCOUNTER — Encounter: Payer: Self-pay | Admitting: Internal Medicine

## 2019-09-13 ENCOUNTER — Other Ambulatory Visit: Payer: Self-pay

## 2019-09-13 VITALS — BP 118/84 | HR 91 | Ht 68.0 in | Wt 205.8 lb

## 2019-09-13 DIAGNOSIS — I639 Cerebral infarction, unspecified: Secondary | ICD-10-CM

## 2019-09-13 HISTORY — PX: OTHER SURGICAL HISTORY: SHX169

## 2019-09-13 NOTE — Progress Notes (Signed)
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 male who presents today for routine electrophysiology followup.  Since last being seen in our clinic, the patient reports doing very well.  Today, he denies symptoms of palpitations, chest pain, shortness of breath,  lower extremity edema, dizziness, presyncope, or syncope.  The patient is otherwise without complaint today.   Past Medical History:  Diagnosis Date  . Depression   . HYPERTENSION 01/09/2009  . 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 Nocturia 01/09/2009  . VIRAL URI 03/22/2010   Past Surgical History:  Procedure Laterality Date  . APPENDECTOMY    . LOOP RECORDER IMPLANT N/A 11/29/2014   Procedure: LOOP RECORDER IMPLANT;  Surgeon: Hillis Range, MD;  Location: [facility] CATH LAB;  Service: Cardiovascular;  Laterality: N/A;  . SPINE SURGERY  2006, 2009   laminectomy  . TEE WITHOUT CARDIOVERSION N/A 11/29/2014   Procedure: TRANSESOPHAGEAL ECHOCARDIOGRAM (TEE);  Surgeon: Jake Bathe, MD;  Location: [facility] ENDOSCOPY;  Service: Cardiovascular;  Laterality: N/A;    ROS- all systems are reviewed and negatives except as per HPI above  Current Outpatient Medications  Medication Sig Dispense Refill  . atorvastatin (LIPITOR) 80 MG tablet TAKE 1 TABLET BY MOUTH EVERY DAY 30 tablet 11  . chlorthalidone (HYGROTON) 25 MG tablet TAKE 1 TABLET BY MOUTH EVERY DAY 30 tablet 11  . clopidogrel (PLAVIX) 75 MG tablet TAKE 1 TABLET BY MOUTH EVERY DAY 30 tablet 11  . finasteride (PROSCAR) 5 MG tablet TAKE 1 TABLET BY MOUTH EVERY DAY 30 tablet 5  . FLUoxetine (PROZAC) 20 MG tablet Take 1 tablet (20 mg total) by mouth daily. 90 tablet 3  . lisinopril (ZESTRIL) 40 MG tablet TAKE 1 TABLET BY MOUTH EVERY DAY 30 tablet 11  . Multiple Vitamin (MULITIVITAMIN WITH MINERALS) TABS Take 1 tablet by mouth daily.    . sildenafil (VIAGRA) 100 MG tablet TAKE 1/2 TO 1 TABLET BY MOUTH ONCE A DAY AS NEEDED FOR ERECTILE DYSFUNCTION 6 tablet  PRN  . tamsulosin (FLOMAX) 0.4 MG CAPS capsule TAKE 1 CAPSULE BY MOUTH EVERY DAY 30 capsule 5   No current facility-administered medications for this visit.    Physical Exam: Vitals:   09/13/19 1052  BP: 118/84  Pulse: 91  SpO2: 97%  Weight: 205 lb 12.8 oz (93.4 kg)  Height: 5\' 8"  (1.727 m)    GEN- The patient is well appearing, alert and oriented x 3 today.   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    I would therefore advise implantation of an implantable loop removal at this time. Risks and benefits to ILR explantation were discussed at length with the patient today, including but not limited to risks of bleeding and infection.  The patient understands and wishes to proceed.  We will proceed at this time with ILR explantation.   07/29/19 MD, [facility] 09/13/2019 11:26 AM    PROCEDURES:   1. Implantable loop recorder explantation  DESCRIPTION OF PROCEDURE:  Informed written consent was obtained.  The patient required no sedation for the procedure today.   The patients left chest was therefore prepped and draped in the usual sterile fashion.  The skin overlying the ILR monitor was infiltrated with lidocaine for local analgesia.  A 0.5-cm incision was made over the site.  The previously implanted ILR was exposed and removed using a combination of sharp and blunt dissection.  Steri- Strips and a sterile dressing were then applied. EBL<22ml.  There were no early apparent complications.     CONCLUSIONS:   1.  Successful explantation of a [employer] Reveal LINQ implantable loop recorder   2. No early apparent complications.        Thompson Grayer MD, [facility] 09/13/2019 11:28 AM

## 2019-09-13 NOTE — Patient Instructions (Addendum)
Medication Instructions:  Your physician recommends that you continue on your current medications as directed. Please refer to the Current Medication list given to you today.  Labwork: None ordered.  Testing/Procedures: None ordered.  Follow-Up:  Your physician wants you to follow-up in: as needed with Dr. Allred.     Implantable Loop Recorder Placement, Care After Refer to this sheet in the next few weeks. These instructions provide you with information about caring for yourself after your procedure. Your health care provider may also give you more specific instructions. Your treatment has been planned according to current medical practices, but problems sometimes occur. Call your health care provider if you have any problems or questions after your procedure. What can I expect after the procedure? After the procedure, it is common to have:  Soreness or pain near the cut from surgery (incision).  Some swelling or bruising near the incision.  Follow these instructions at home:  Medicines  Take over-the-counter and prescription medicines only as told by your health care provider.  If you were prescribed an antibiotic medicine, take it as told by your health care provider. Do not stop taking the antibiotic even if you start to feel better.  Bathing Do not take baths, swim, or use a hot tub until your health care provider approves. You may shower 24 hours after removal of your monitor.  Incision care  Follow instructions from your health care provider about how to take care of your incision. Make sure you: ? Remove your top dressing after 24 hours (before you shower) ? Leave stitches (sutures), skin glue, or adhesive strips in place. These skin closures may need to stay in place for 2 weeks or longer. If adhesive strip edges start to loosen and curl up, you may trim the loose edges. Do not remove adhesive strips completely unless your health care provider tells you to do  that.  Check your incision area every day for signs of infection. Check for: ? More redness, swelling, or pain. ? Fluid or blood. ? Warmth. ? Pus or a bad smell.  Contact a health care provider if:  You have more redness, swelling, or pain around your incision.  You have more fluid or blood coming from your incision.  Your incision feels warm to the touch.  You have pus or a bad smell coming from your incision.  You have a fever.  You have pain that is not relieved by your pain medicine.  You have triggered your device because of fainting (syncope) or because of a heartbeat that feels like it is racing, slow, fluttering, or skipping (palpitations).  

## 2019-09-29 ENCOUNTER — Other Ambulatory Visit: Payer: Self-pay | Admitting: Family Medicine

## 2019-09-29 NOTE — Telephone Encounter (Signed)
May change to Cialis 20 mg every other day as needed for erectile dysfunction, dispense #10 with as needed refills

## 2019-09-29 NOTE — Telephone Encounter (Signed)
Please see message on Rx

## 2019-10-26 ENCOUNTER — Other Ambulatory Visit: Payer: Self-pay | Admitting: Family Medicine

## 2019-10-26 NOTE — Telephone Encounter (Signed)
Refill for 3 months. 

## 2019-11-16 ENCOUNTER — Emergency Department (HOSPITAL_COMMUNITY)
Admission: EM | Admit: 2019-11-16 | Discharge: 2019-11-17 | Disposition: A | Discharge status: Home / Self Care | Payer: 59 | Attending: Emergency Medicine | Admitting: Emergency Medicine

## 2019-11-16 DIAGNOSIS — I10 Essential (primary) hypertension: Secondary | ICD-10-CM | POA: Diagnosis not present

## 2019-11-16 DIAGNOSIS — Z79899 Other long term (current) drug therapy: Secondary | ICD-10-CM | POA: Diagnosis not present

## 2019-11-16 DIAGNOSIS — R42 Dizziness and giddiness: Secondary | ICD-10-CM | POA: Diagnosis not present

## 2019-11-16 DIAGNOSIS — Z7901 Long term (current) use of anticoagulants: Secondary | ICD-10-CM | POA: Insufficient documentation

## 2019-11-16 HISTORY — DX: Cerebral infarction, unspecified: I63.9

## 2019-11-17 ENCOUNTER — Emergency Department (HOSPITAL_COMMUNITY): Payer: 59

## 2019-11-17 ENCOUNTER — Encounter (HOSPITAL_COMMUNITY): Payer: Self-pay | Admitting: Emergency Medicine

## 2019-11-17 ENCOUNTER — Other Ambulatory Visit: Payer: Self-pay

## 2019-11-17 DIAGNOSIS — R42 Dizziness and giddiness: Secondary | ICD-10-CM

## 2019-11-17 LAB — CBC
HCT: 47.6 % (ref 39.0–52.0)
Hemoglobin: 16.6 g/dL (ref 13.0–17.0)
MCH: 30.6 pg (ref 26.0–34.0)
MCHC: 34.9 g/dL (ref 30.0–36.0)
MCV: 87.7 fL (ref 80.0–100.0)
Platelets: 231 10*3/uL (ref 150–400)
RBC: 5.43 MIL/uL (ref 4.22–5.81)
RDW: 12.8 % (ref 11.5–15.5)
WBC: 6.7 10*3/uL (ref 4.0–10.5)
nRBC: 0 % (ref 0.0–0.2)

## 2019-11-17 LAB — DIFFERENTIAL
Abs Immature Granulocytes: 0.02 10*3/uL (ref 0.00–0.07)
Basophils Absolute: 0 10*3/uL (ref 0.0–0.1)
Basophils Relative: 1 %
Eosinophils Absolute: 0.2 10*3/uL (ref 0.0–0.5)
Eosinophils Relative: 3 %
Immature Granulocytes: 0 %
Lymphocytes Relative: 14 %
Lymphs Abs: 1 10*3/uL (ref 0.7–4.0)
Monocytes Absolute: 0.6 10*3/uL (ref 0.1–1.0)
Monocytes Relative: 9 %
Neutro Abs: 4.9 10*3/uL (ref 1.7–7.7)
Neutrophils Relative %: 73 %

## 2019-11-17 LAB — I-STAT CHEM 8, ED
BUN: 19 mg/dL (ref 6–20)
Calcium, Ion: 1.13 mmol/L — ABNORMAL LOW (ref 1.15–1.40)
Chloride: 97 mmol/L — ABNORMAL LOW (ref 98–111)
Creatinine, Ser: 1.4 mg/dL — ABNORMAL HIGH (ref 0.61–1.24)
Glucose, Bld: 115 mg/dL — ABNORMAL HIGH (ref 70–99)
HCT: 48 % (ref 39.0–52.0)
Hemoglobin: 16.3 g/dL (ref 13.0–17.0)
Potassium: 3.5 mmol/L (ref 3.5–5.1)
Sodium: 137 mmol/L (ref 135–145)
TCO2: 30 mmol/L (ref 22–32)

## 2019-11-17 LAB — COMPREHENSIVE METABOLIC PANEL
ALT: 39 U/L (ref 0–44)
AST: 27 U/L (ref 15–41)
Albumin: 4.2 g/dL (ref 3.5–5.0)
Alkaline Phosphatase: 63 U/L (ref 38–126)
Anion gap: 14 (ref 5–15)
BUN: 16 mg/dL (ref 6–20)
CO2: 27 mmol/L (ref 22–32)
Calcium: 9.4 mg/dL (ref 8.9–10.3)
Chloride: 97 mmol/L — ABNORMAL LOW (ref 98–111)
Creatinine, Ser: 1.5 mg/dL — ABNORMAL HIGH (ref 0.61–1.24)
GFR calc Af Amer: 58 mL/min — ABNORMAL LOW (ref >60)
GFR calc non Af Amer: 50 mL/min — ABNORMAL LOW (ref >60)
Glucose, Bld: 122 mg/dL — ABNORMAL HIGH (ref 70–99)
Potassium: 3.6 mmol/L (ref 3.5–5.1)
Sodium: 138 mmol/L (ref 135–145)
Total Bilirubin: 0.8 mg/dL (ref 0.3–1.2)
Total Protein: 7.3 g/dL (ref 6.5–8.1)

## 2019-11-17 LAB — APTT: aPTT: 29 seconds (ref 24–36)

## 2019-11-17 LAB — PROTIME-INR
INR: 1.1 (ref 0.8–1.2)
Prothrombin Time: 14 seconds (ref 11.4–15.2)

## 2019-11-17 MED ORDER — SODIUM CHLORIDE 0.9% FLUSH: 3.0000 mL | Freq: Once | INTRAVENOUS | Status: DC

## 2019-11-17 MED ORDER — MECLIZINE HCL 25 MG PO TABS
25.0000 mg | ORAL_TABLET | Freq: Once | ORAL | Status: AC
  Administered 2019-11-17: 25 mg via ORAL
  Filled 2019-11-17: qty 1

## 2019-11-17 MED ORDER — MECLIZINE HCL 25 MG PO TABS: 25.0000 mg | ORAL_TABLET | Freq: Three times a day (TID) | ORAL | 0 refills | Status: DC | PRN

## 2019-11-17 MED ORDER — IOHEXOL 350 MG/ML SOLN
75.0000 mL | Freq: Once | INTRAVENOUS | Status: AC | PRN
  Administered 2019-11-17: 75 mL via INTRAVENOUS

## 2019-11-17 NOTE — Consult Note (Signed)
Requesting Physician: Dr. Leonette Monarch    Chief Complaint: Dizziness and gait imbalance  History obtained from: Patient and Chart    HPI:                                                                                                                                       Jesse Pacheco is a 59 y.o. male with past medical history of hypertension, embolic CVA, depression presents to the emergency department after developed sudden onset dizziness, nausea and gait imbalance that began around 6 PM yesterday evening.  Patient was in his normal state of health when suddenly over 6 PM he noticed he felt dizzy.  He denies sensation of room spinning, rather they seemed off.  He denies having vomiting but is currently nauseous.  He has been having difficulty with walking and decided to present himself to the emergency department.  Per EDP , nystagmus was noted appeared to be more horizontal and test of skew was negative.  Head impulse was positive to the left.  However given his prior history of stroke, patient was immediately taken to CT scan.  CT head unremarkable for acute findings and CT angiogram negative for occlusion.  He is outside the window for TPA.   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   Nocturia 01/09/2009   VIRAL URI 03/22/2010    Past Surgical History:  Procedure Laterality Date   APPENDECTOMY     loop recorder explant  09/13/2019   MDT Reveal LINQ removal in office by Dr Rayann Heman for EOS   LOOP RECORDER IMPLANT N/A 11/29/2014   Procedure: LOOP RECORDER IMPLANT;  Surgeon: Thompson Grayer, MD;  Location: [facility] CATH LAB;  Service: Cardiovascular;  Laterality: N/A;   SPINE SURGERY  2006, 2009   laminectomy   TEE WITHOUT CARDIOVERSION N/A 11/29/2014   Procedure:  TRANSESOPHAGEAL ECHOCARDIOGRAM (TEE);  Surgeon: Jerline Pain, MD;  Location: [facility] ENDOSCOPY;  Service: Cardiovascular;  Laterality: N/A;    Family History  Problem Relation Age of Onset   Hypertension Mother    Stroke Mother    Heart disease Father 44       CABG   Social History:  reports that he has never smoked. He has never used smokeless tobacco. He reports current alcohol use. He reports that he does not use drugs.  Allergies: No Known Allergies  Medications:  I reviewed home medications   ROS:                                                                                                                                     14 systems reviewed and negative except above    Examination:                                                                                                      General: Appears well-developed  Psych: Affect appropriate to situation Eyes: No scleral injection HENT: No OP obstrucion Head: Normocephalic.  Cardiovascular: Normal rate and regular rhythm.  Respiratory: Effort normal and breath sounds normal to anterior ascultation GI: Soft.  No distension. There is no tenderness.  Skin: WDI    Neurological Examination Mental Status: Alert, oriented, thought content appropriate.  Speech fluent without evidence of aphasia. Able to follow 3 step commands without difficulty. Cranial Nerves: II: Visual fields grossly normal,  III,IV, VI: ptosis not present, extra-ocular motions intact bilaterally, mild horizontal and rotatory nystagamus with fast component to right side.  pupils equal, round, reactive to light and accommodation V,VII: smile symmetric, facial light touch sensation normal bilaterally VIII: hearing normal bilaterally IX,X: uvula rises symmetrically XI: bilateral shoulder shrug XII: midline tongue extension Motor: Right  : Upper extremity   5/5    Left:     Upper extremity   5/5  Lower extremity   5/5     Lower extremity   5/5 Tone and bulk:normal tone throughout; no atrophy noted Sensory: Pinprick and light touch intact throughout, bilaterally Deep Tendon Reflexes: 2+ and symmetric throughout Plantars: Right: downgoing   Left: downgoing Cerebellar: normal finger-to-nose, normal rapid alternating movements and normal heel-to-shin test Gait: normal gait and station     Lab Results: Basic Metabolic Panel: Recent Labs  Lab 11/17/19 0034 11/17/19 0050  NA 138 137  K 3.6 3.5  CL 97* 97*  CO2 27  --   GLUCOSE 122* 115*  BUN 16 19  CREATININE 1.50* 1.40*  CALCIUM 9.4  --     CBC: Recent Labs  Lab 11/17/19 0034 11/17/19 0050  WBC 6.7  --   NEUTROABS 4.9  --   HGB 16.6 16.3  HCT 47.6 48.0  MCV 87.7  --   PLT 231  --     Coagulation Studies: Recent Labs    11/17/19 0034  LABPROT 14.0  INR 1.1    Imaging: CT Angio Head W or Wo Contrast  Result  Date: 11/17/2019 CLINICAL DATA:  Dizziness, ataxia and nausea. EXAM: CT ANGIOGRAPHY HEAD AND NECK TECHNIQUE: Multidetector CT imaging of the head and neck was performed using the standard protocol during bolus administration of intravenous contrast. Multiplanar CT image reconstructions and MIPs were obtained to evaluate the vascular anatomy. Carotid stenosis measurements (when applicable) are obtained utilizing NASCET criteria, using the distal internal carotid diameter as the denominator. CONTRAST:  52mL OMNIPAQUE IOHEXOL 350 MG/ML SOLN COMPARISON:  None. FINDINGS: CT HEAD FINDINGS Brain: There is no mass, hemorrhage or extra-axial collection. The size and configuration of the ventricles and extra-axial CSF spaces are normal. There are old left frontal and parietal infarcts. No intracranial hemorrhage. The brain parenchyma is normal. Skull: The visualized skull base, calvarium and extracranial soft tissues are normal. Sinuses/Orbits: No fluid levels  or advanced mucosal thickening of the visualized paranasal sinuses. No mastoid or middle ear effusion. The orbits are normal. CTA NECK FINDINGS SKELETON: There is no bony spinal canal stenosis. No lytic or blastic lesion. C3-C6 ACDF. OTHER NECK: Normal pharynx, larynx and major salivary glands. No cervical lymphadenopathy. Unremarkable thyroid gland. UPPER CHEST: No pneumothorax or pleural effusion. No nodules or masses. AORTIC ARCH: There is no calcific atherosclerosis of the aortic arch. There is no aneurysm, dissection or hemodynamically significant stenosis of the visualized portion of the aorta. Conventional 3 vessel aortic branching pattern. The visualized proximal subclavian arteries are widely patent. RIGHT CAROTID SYSTEM: Normal without aneurysm, dissection or stenosis. LEFT CAROTID SYSTEM: Normal without aneurysm, dissection or stenosis. VERTEBRAL ARTERIES: Left dominant configuration. Both origins are clearly patent. There is no dissection, occlusion or flow-limiting stenosis to the skull base (V1-V3 segments). CTA HEAD FINDINGS POSTERIOR CIRCULATION: --Vertebral arteries: Normal V4 segments. --Posterior inferior cerebellar arteries (PICA): Patent origins from the vertebral arteries. --Anterior inferior cerebellar arteries (AICA): Patent origins from the basilar artery. --Basilar artery: Normal. --Superior cerebellar arteries: Normal. --Posterior cerebral arteries: Normal. The left PCA is predominantly supplied by the posterior communicating artery. ANTERIOR CIRCULATION: --Intracranial internal carotid arteries: Normal. --Anterior cerebral arteries (ACA): Normal. Absent right A1 segment, normal variant --Middle cerebral arteries (MCA): Normal. VENOUS SINUSES: As permitted by contrast timing, patent. ANATOMIC VARIANTS: None Review of the MIP images confirms the above findings. IMPRESSION: 1. No emergent large vessel occlusion or hemodynamically significant stenosis of the head or neck. 2. Old left frontal  and parietal infarcts. Electronically Signed   By: Deatra Robinson M.D.   On: 11/17/2019 01:18   CT Angio Neck W and/or Wo Contrast  Result Date: 11/17/2019 CLINICAL DATA:  Dizziness, ataxia and nausea. EXAM: CT ANGIOGRAPHY HEAD AND NECK TECHNIQUE: Multidetector CT imaging of the head and neck was performed using the standard protocol during bolus administration of intravenous contrast. Multiplanar CT image reconstructions and MIPs were obtained to evaluate the vascular anatomy. Carotid stenosis measurements (when applicable) are obtained utilizing NASCET criteria, using the distal internal carotid diameter as the denominator. CONTRAST:  70mL OMNIPAQUE IOHEXOL 350 MG/ML SOLN COMPARISON:  None. FINDINGS: CT HEAD FINDINGS Brain: There is no mass, hemorrhage or extra-axial collection. The size and configuration of the ventricles and extra-axial CSF spaces are normal. There are old left frontal and parietal infarcts. No intracranial hemorrhage. The brain parenchyma is normal. Skull: The visualized skull base, calvarium and extracranial soft tissues are normal. Sinuses/Orbits: No fluid levels or advanced mucosal thickening of the visualized paranasal sinuses. No mastoid or middle ear effusion. The orbits are normal. CTA NECK FINDINGS SKELETON: There is no bony spinal canal stenosis. No lytic or  blastic lesion. C3-C6 ACDF. OTHER NECK: Normal pharynx, larynx and major salivary glands. No cervical lymphadenopathy. Unremarkable thyroid gland. UPPER CHEST: No pneumothorax or pleural effusion. No nodules or masses. AORTIC ARCH: There is no calcific atherosclerosis of the aortic arch. There is no aneurysm, dissection or hemodynamically significant stenosis of the visualized portion of the aorta. Conventional 3 vessel aortic branching pattern. The visualized proximal subclavian arteries are widely patent. RIGHT CAROTID SYSTEM: Normal without aneurysm, dissection or stenosis. LEFT CAROTID SYSTEM: Normal without aneurysm,  dissection or stenosis. VERTEBRAL ARTERIES: Left dominant configuration. Both origins are clearly patent. There is no dissection, occlusion or flow-limiting stenosis to the skull base (V1-V3 segments). CTA HEAD FINDINGS POSTERIOR CIRCULATION: --Vertebral arteries: Normal V4 segments. --Posterior inferior cerebellar arteries (PICA): Patent origins from the vertebral arteries. --Anterior inferior cerebellar arteries (AICA): Patent origins from the basilar artery. --Basilar artery: Normal. --Superior cerebellar arteries: Normal. --Posterior cerebral arteries: Normal. The left PCA is predominantly supplied by the posterior communicating artery. ANTERIOR CIRCULATION: --Intracranial internal carotid arteries: Normal. --Anterior cerebral arteries (ACA): Normal. Absent right A1 segment, normal variant --Middle cerebral arteries (MCA): Normal. VENOUS SINUSES: As permitted by contrast timing, patent. ANATOMIC VARIANTS: None Review of the MIP images confirms the above findings. IMPRESSION: 1. No emergent large vessel occlusion or hemodynamically significant stenosis of the head or neck. 2. Old left frontal and parietal infarcts. Electronically Signed   By: Deatra Robinson M.D.   On: 11/17/2019 01:18     ASSESSMENT AND PLAN  59 y.o. male with past medical history of hypertension, embolic CVA, depression presents to the emergency department after developed sudden onset dizziness, nausea and gait imbalance that began around 6 PM yesterday evening.   Dizziness/gait imbalance D/D acute stroke vs peripheral vertigo   Recommendations - CTA head and neck -Stat MRI brain, if positive for acute stroke needs admission  Addendum - MRI negative for acute stroke. CTA negative for hemodynamically signficant stenois.  - Patient's symptoms improved after meclizine.  - continue secondary stroke prevention with antiplatelet, statin and BP control.  Recommend outpatient neurology follow up.    [facility] Pager Number [phone]

## 2019-11-17 NOTE — ED Notes (Signed)
Pt to CT at this time.

## 2019-11-17 NOTE — ED Provider Notes (Signed)
[facility] EMERGENCY DEPARTMENT Provider Note  CSN: [id number] Arrival date & time: 11/16/19 2229  Chief Complaint(s) Dizziness  HPI Jesse Pacheco is a 59 y.o. male with a history of hypertension and prior cryptogenic stroke in 2016 with no residual deficits who presents to the emergency department with sudden onset vertigo, nausea and ataxia.  This began 2 hours prior to arrival.  Patient also noted mildly elevated high blood pressures with systolics in the 160s.  Symptoms worse with position changes and ambulation.  He denies any visual disturbance, headache, weakness, loss of sensation, chest pain, shortness of breath, abdominal pain.  He denies any recent fevers or infections.  No prior history of vertigo.  No other physical complaints.  HPI  Past Medical History Past Medical History:  Diagnosis Date  . CVA (cerebrovascular accident) (HCC)   . Depression   . HYPERTENSION 01/09/2009  . 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 Nocturia 01/09/2009  . VIRAL URI 03/22/2010   Patient Active Problem List   Diagnosis Date Noted  . Depression, recurrent (HCC) 07/27/2019  . Prediabetes 10/08/2016  . Depression, major, single episode, severe (HCC) 10/08/2016  . 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 Benign prostatic hyperplasia 10/09/2015  . Cryptogenic stroke (HCC) 11/27/2014  . Hyperlipidemia 11/27/2014  . Acute ischemic stroke (HCC)   . Transient confusion   . 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 Shoulder bursitis 10/24/2014  . Elevated PSA 09/22/2012  . VIRAL URI 03/22/2010  . 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 Essential hypertension 01/09/2009  . NOCTURIA 01/09/2009   Home Medication(s) Prior to Admission medications   Medication Sig Start Date End Date Taking? Authorizing Provider  atorvastatin (LIPITOR) 80 MG tablet TAKE 1 TABLET BY MOUTH EVERY DAY 02/09/19  Yes Burchette, Elberta Fortis, MD  chlorthalidone (HYGROTON) 25 MG tablet TAKE 1 TABLET BY  MOUTH EVERY DAY 02/09/19  Yes Burchette, Elberta Fortis, MD  clopidogrel (PLAVIX) 75 MG tablet TAKE 1 TABLET BY MOUTH EVERY DAY 02/09/19  Yes Burchette, Elberta Fortis, MD  finasteride (PROSCAR) 5 MG tablet TAKE 1 TABLET BY MOUTH EVERY DAY 08/30/19  Yes Burchette, Elberta Fortis, MD  lisinopril (ZESTRIL) 40 MG tablet TAKE 1 TABLET BY MOUTH EVERY DAY 02/09/19  Yes Burchette, Elberta Fortis, MD  Multiple Vitamin (MULITIVITAMIN WITH MINERALS) TABS Take 1 tablet by mouth daily.   Yes [provider]  tamsulosin (FLOMAX) 0.4 MG CAPS capsule TAKE 1 CAPSULE BY MOUTH EVERY DAY 08/30/19  Yes Burchette, Elberta Fortis, MD  traZODone (DESYREL) 50 MG tablet TAKE 1 TABLET BY MOUTH EVERYDAY AT BEDTIME Patient taking differently: Take 50 mg by mouth at bedtime as needed for sleep.  10/27/19  Yes Burchette, Elberta Fortis, MD  meclizine (ANTIVERT) 25 MG tablet Take 1 tablet (25 mg total) by mouth 3 (three) times daily as needed for dizziness. 11/17/19   Nira Conn, MD  tadalafil (CIALIS) 20 MG tablet Take one every other day as needed for erectile dysfunction 09/29/19   Burchette, Elberta Fortis, MD  Past Surgical History Past Surgical History:  Procedure Laterality Date  . APPENDECTOMY    . loop recorder explant  09/13/2019   MDT Reveal LINQ removal in office by Dr Johney Frame for EOS  . LOOP RECORDER IMPLANT N/A 11/29/2014   Procedure: LOOP RECORDER IMPLANT;  Surgeon: Hillis Range, MD;  Location: [facility] CATH LAB;  Service: Cardiovascular;  Laterality: N/A;  . SPINE SURGERY  2006, 2009   laminectomy  . TEE WITHOUT CARDIOVERSION N/A 11/29/2014   Procedure: TRANSESOPHAGEAL ECHOCARDIOGRAM (TEE);  Surgeon: Jake Bathe, MD;  Location: [facility] ENDOSCOPY;  Service: Cardiovascular;  Laterality: N/A;   Family History Family History  Problem Relation Age of Onset  . Hypertension Mother   . Stroke Mother   . Heart disease Father 35        CABG    Social History Social History   Tobacco Use  . Smoking status: Never Smoker  . Smokeless tobacco: Never Used  Substance Use Topics  . Alcohol use: Yes    Comment: drinks 3-4 times a week  . Drug use: No   Allergies Patient has no known allergies.  Review of Systems Review of Systems All other systems are reviewed and are negative for acute change except as noted in the HPI  Physical Exam Vital Signs  I have reviewed the triage vital signs BP (!) 142/95   Pulse (!) 102   Temp 97.6 F (36.4 C) (Oral)   Resp 16   Ht 5\' 8"  (1.727 m)   Wt 88.5 kg   SpO2 99%   BMI 29.65 kg/m   Physical Exam Vitals reviewed.  Constitutional:      General: He is not in acute distress.    Appearance: He is well-developed. He is not diaphoretic.  HENT:     Head: Normocephalic and atraumatic.     Nose: Nose normal.  Eyes:     General: No scleral icterus.       Right eye: No discharge.        Left eye: No discharge.     Conjunctiva/sclera: Conjunctivae normal.     Pupils: Pupils are equal, round, and reactive to light.  Cardiovascular:     Rate and Rhythm: Normal rate and regular rhythm.     Heart sounds: No murmur. No friction rub. No gallop.   Pulmonary:     Effort: Pulmonary effort is normal. No respiratory distress.     Breath sounds: Normal breath sounds. No stridor. No rales.  Abdominal:     General: There is no distension.     Palpations: Abdomen is soft.     Tenderness: There is no abdominal tenderness.  Musculoskeletal:        General: No tenderness.     Cervical back: Normal range of motion and neck supple.  Skin:    General: Skin is warm and dry.     Findings: No erythema or rash.  Neurological:     Mental Status: He is alert and oriented to person, place, and time.     Comments: Mental Status:  Alert and oriented to person, place, and time.  Attention and concentration normal.  Speech clear.  Recent memory is intact  Cranial Nerves:  II Visual  Fields: Intact to confrontation. Visual fields intact. III, IV, VI: Pupils equal and reactive to light and near. Full eye movement V Facial Sensation: Normal. No weakness of masticatory muscles  VII: No facial weakness or asymmetry  VIII Auditory Acuity: Grossly normal  IX/X: The uvula  is midline; the palate elevates symmetrically  XI: Normal sternocleidomastoid and trapezius strength  XII: The tongue is midline. No atrophy or fasciculations.   Motor System: Muscle Strength: 5/5 and symmetric in the upper and lower extremities. No pronation or drift.  Muscle Tone: Tone and muscle bulk are normal in the upper and lower extremities.   Reflexes: DTRs: 1+ and symmetrical in all four extremities. No Clonus Coordination: Intact finger-to-nose, heel-to-shin. No tremor.  Sensation: Intact to light touch, and pinprick. Negative Romberg test.  Gait: ataxic  HINTS Plus: Nystagmus: right unilateraal Head impulse: abnormal Skew: normal Hearing: normal       ED Results and Treatments Labs (all labs ordered are listed, but only abnormal results are displayed) Labs Reviewed  COMPREHENSIVE METABOLIC PANEL - Abnormal; Notable for the following components:      Result Value   Chloride 97 (*)    Glucose, Bld 122 (*)    Creatinine, Ser 1.50 (*)    GFR calc non Af Amer 50 (*)    GFR calc Af Amer 58 (*)    All other components within normal limits  I-STAT CHEM 8, ED - Abnormal; Notable for the following components:   Chloride 97 (*)    Creatinine, Ser 1.40 (*)    Glucose, Bld 115 (*)    Calcium, Ion 1.13 (*)    All other components within normal limits  PROTIME-INR  APTT  CBC  DIFFERENTIAL  CBG MONITORING, ED                                                                                                                         EKG  EKG Interpretation  Date/Time:  Wednesday November 17 2019 00:06:52 EDT Ventricular Rate:  67 PR Interval:  190 QRS Duration: 100 QT Interval:  432 QTC  Calculation: 456 R Axis:   -55 Text Interpretation: Normal sinus rhythm Incomplete right bundle branch block Left anterior fascicular block Anterolateral infarct , age undetermined Abnormal ECG No acute changes Confirmed by Drema Pry [id number]) on 11/17/2019 2:23:47 AM      Radiology CT Angio Head W or Wo Contrast  Result Date: 11/17/2019 CLINICAL DATA:  Dizziness, ataxia and nausea. EXAM: CT ANGIOGRAPHY HEAD AND NECK TECHNIQUE: Multidetector CT imaging of the head and neck was performed using the standard protocol during bolus administration of intravenous contrast. Multiplanar CT image reconstructions and MIPs were obtained to evaluate the vascular anatomy. Carotid stenosis measurements (when applicable) are obtained utilizing NASCET criteria, using the distal internal carotid diameter as the denominator. CONTRAST:  75mL OMNIPAQUE IOHEXOL 350 MG/ML SOLN COMPARISON:  None. FINDINGS: CT HEAD FINDINGS Brain: There is no mass, hemorrhage or extra-axial collection. The size and configuration of the ventricles and extra-axial CSF spaces are normal. There are old left frontal and parietal infarcts. No intracranial hemorrhage. The brain parenchyma is normal. Skull: The visualized skull base, calvarium and extracranial soft tissues are normal. Sinuses/Orbits: No fluid levels or advanced mucosal thickening of the visualized  paranasal sinuses. No mastoid or middle ear effusion. The orbits are normal. CTA NECK FINDINGS SKELETON: There is no bony spinal canal stenosis. No lytic or blastic lesion. C3-C6 ACDF. OTHER NECK: Normal pharynx, larynx and major salivary glands. No cervical lymphadenopathy. Unremarkable thyroid gland. UPPER CHEST: No pneumothorax or pleural effusion. No nodules or masses. AORTIC ARCH: There is no calcific atherosclerosis of the aortic arch. There is no aneurysm, dissection or hemodynamically significant stenosis of the visualized portion of the aorta. Conventional 3 vessel aortic branching  pattern. The visualized proximal subclavian arteries are widely patent. RIGHT CAROTID SYSTEM: Normal without aneurysm, dissection or stenosis. LEFT CAROTID SYSTEM: Normal without aneurysm, dissection or stenosis. VERTEBRAL ARTERIES: Left dominant configuration. Both origins are clearly patent. There is no dissection, occlusion or flow-limiting stenosis to the skull base (V1-V3 segments). CTA HEAD FINDINGS POSTERIOR CIRCULATION: --Vertebral arteries: Normal V4 segments. --Posterior inferior cerebellar arteries (PICA): Patent origins from the vertebral arteries. --Anterior inferior cerebellar arteries (AICA): Patent origins from the basilar artery. --Basilar artery: Normal. --Superior cerebellar arteries: Normal. --Posterior cerebral arteries: Normal. The left PCA is predominantly supplied by the posterior communicating artery. ANTERIOR CIRCULATION: --Intracranial internal carotid arteries: Normal. --Anterior cerebral arteries (ACA): Normal. Absent right A1 segment, normal variant --Middle cerebral arteries (MCA): Normal. VENOUS SINUSES: As permitted by contrast timing, patent. ANATOMIC VARIANTS: None Review of the MIP images confirms the above findings. IMPRESSION: 1. No emergent large vessel occlusion or hemodynamically significant stenosis of the head or neck. 2. Old left frontal and parietal infarcts. Electronically Signed   By: Ulyses Jarred M.D.   On: 11/17/2019 01:18   CT Angio Neck W and/or Wo Contrast  Result Date: 11/17/2019 CLINICAL DATA:  Dizziness, ataxia and nausea. EXAM: CT ANGIOGRAPHY HEAD AND NECK TECHNIQUE: Multidetector CT imaging of the head and neck was performed using the standard protocol during bolus administration of intravenous contrast. Multiplanar CT image reconstructions and MIPs were obtained to evaluate the vascular anatomy. Carotid stenosis measurements (when applicable) are obtained utilizing NASCET criteria, using the distal internal carotid diameter as the denominator. CONTRAST:   73mL OMNIPAQUE IOHEXOL 350 MG/ML SOLN COMPARISON:  None. FINDINGS: CT HEAD FINDINGS Brain: There is no mass, hemorrhage or extra-axial collection. The size and configuration of the ventricles and extra-axial CSF spaces are normal. There are old left frontal and parietal infarcts. No intracranial hemorrhage. The brain parenchyma is normal. Skull: The visualized skull base, calvarium and extracranial soft tissues are normal. Sinuses/Orbits: No fluid levels or advanced mucosal thickening of the visualized paranasal sinuses. No mastoid or middle ear effusion. The orbits are normal. CTA NECK FINDINGS SKELETON: There is no bony spinal canal stenosis. No lytic or blastic lesion. C3-C6 ACDF. OTHER NECK: Normal pharynx, larynx and major salivary glands. No cervical lymphadenopathy. Unremarkable thyroid gland. UPPER CHEST: No pneumothorax or pleural effusion. No nodules or masses. AORTIC ARCH: There is no calcific atherosclerosis of the aortic arch. There is no aneurysm, dissection or hemodynamically significant stenosis of the visualized portion of the aorta. Conventional 3 vessel aortic branching pattern. The visualized proximal subclavian arteries are widely patent. RIGHT CAROTID SYSTEM: Normal without aneurysm, dissection or stenosis. LEFT CAROTID SYSTEM: Normal without aneurysm, dissection or stenosis. VERTEBRAL ARTERIES: Left dominant configuration. Both origins are clearly patent. There is no dissection, occlusion or flow-limiting stenosis to the skull base (V1-V3 segments). CTA HEAD FINDINGS POSTERIOR CIRCULATION: --Vertebral arteries: Normal V4 segments. --Posterior inferior cerebellar arteries (PICA): Patent origins from the vertebral arteries. --Anterior inferior cerebellar arteries (AICA): Patent origins from the basilar  artery. --Basilar artery: Normal. --Superior cerebellar arteries: Normal. --Posterior cerebral arteries: Normal. The left PCA is predominantly supplied by the posterior communicating artery.  ANTERIOR CIRCULATION: --Intracranial internal carotid arteries: Normal. --Anterior cerebral arteries (ACA): Normal. Absent right A1 segment, normal variant --Middle cerebral arteries (MCA): Normal. VENOUS SINUSES: As permitted by contrast timing, patent. ANATOMIC VARIANTS: None Review of the MIP images confirms the above findings. IMPRESSION: 1. No emergent large vessel occlusion or hemodynamically significant stenosis of the head or neck. 2. Old left frontal and parietal infarcts. Electronically Signed   By: Deatra RobinsonKevin  Herman M.D.   On: 11/17/2019 01:18   MR BRAIN WO CONTRAST  Result Date: 11/17/2019 CLINICAL DATA:  Sudden onset of dizziness and nausea EXAM: MRI HEAD WITHOUT CONTRAST TECHNIQUE: Multiplanar, multiecho pulse sequences of the brain and surrounding structures were obtained without intravenous contrast. COMPARISON:  01/27/2015 FINDINGS: Brain: Remote left parasagittal frontal and temporo-occipital infarcts which were seen in 2016. No acute or interval infarct. No acute hemorrhage, hydrocephalus, or masslike finding. Hemosiderin staining is associated with the remote infarcts. Brain volume is overall normal. Vascular: Normal arterial flow voids. There is absent flow related signal in the right transverse sigmoid dural sinus which is chronic based on prior MR Skull and upper cervical spine: No evidence of marrow lesion. Sinuses/Orbits: Negative IMPRESSION: 1. No acute finding, including infarct. 2. Remote left cerebral infarcts as seen in 2016. 3. 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 Electronically Signed   By: Marnee SpringJonathon  Watts M.D.   On: 11/17/2019 04:35    Pertinent labs & imaging results that were available during my care of the patient were reviewed by me and considered in my medical decision making (see chart for details).  Medications Ordered in ED Medications  sodium chloride flush (NS) 0.9 % injection 3 mL (3 mLs Intravenous Not Given 11/17/19 0044)  iohexol (OMNIPAQUE) 350  MG/ML injection 75 mL (75 mLs Intravenous Contrast Given 11/17/19 0105)  meclizine (ANTIVERT) tablet 25 mg (25 mg Oral Given 11/17/19 0446)                                                                                                                                    Procedures Procedures  (including critical care time)  Medical Decision Making / ED Course I have reviewed the nursing notes for this encounter and the patient's prior records (if available in EHR or on provided paperwork).   Drucie Opitzhomas Rallo was evaluated in Emergency Department on 11/17/2019 for the symptoms described in the history of present illness. He was evaluated in the context of the global COVID-19 pandemic, which necessitated consideration that the patient might be at risk for infection with the SARS-CoV-2 virus that causes COVID-19. Institutional protocols and algorithms that pertain to the evaluation of patients at risk for COVID-19 are in a state of rapid change based on information released by regulatory bodies including the CDC and federal and state organizations.  These policies and algorithms were followed during the patient's care in the ED.  Sudden onset vertiginous symptoms and ataxia.  Patient with a history of prior stroke including left PCA stroke.  Hints exam is reassuring, but patient is significantly ataxic.  Given his prior history Case was discussed with neurology who recommended stroke work-up.  Patient is not meet LVO/VAN for code stroke at this time.  Stroke work-up was reassuring without evidence of acute stroke on CT or MRI.  Patient provided with meclizine.  Patient able to ambulate without significant complication.      Final Clinical Impression(s) / ED Diagnoses Final diagnoses:  Vertigo   The patient appears reasonably screened and/or stabilized for discharge and I doubt any other medical condition or other [facility] requiring further screening, evaluation, or treatment in the ED at this time  prior to discharge. Safe for discharge with strict return precautions.  Disposition: Discharge  Condition: Good  I have discussed the results, Dx and Tx plan with the patient/family who expressed understanding and agree(s) with the plan. Discharge instructions discussed at length. The patient/family was given strict return precautions who verbalized understanding of the instructions. No further questions at time of discharge.    ED Discharge Orders         Ordered    meclizine (ANTIVERT) 25 MG tablet  3 times daily PRN     11/17/19 8592           Follow Up: Kristian Covey, MD [address] [place] [address] [phone]  Schedule an appointment as soon as possible for a visit  As needed       This chart was dictated using voice recognition software.  Despite best efforts to proofread,  errors can occur which can change the documentation meaning.   Nira Conn, MD 11/17/19 [phone]

## 2019-11-17 NOTE — ED Triage Notes (Signed)
Pt to ED with c/o sudden onset of dizziness with nausea while sitting on couch.  Pt st's he checked his B/P at home and it was high.  Pt alert and oriented x's 4.   Dr. Eudelia Bunch to triage to assess pt.

## 2019-11-22 ENCOUNTER — Other Ambulatory Visit: Payer: Self-pay

## 2019-11-22 ENCOUNTER — Ambulatory Visit (INDEPENDENT_AMBULATORY_CARE_PROVIDER_SITE_OTHER): Payer: 59 | Admitting: Family Medicine

## 2019-11-22 ENCOUNTER — Encounter: Payer: Self-pay | Admitting: Family Medicine

## 2019-11-22 VITALS — BP 122/78 | HR 97 | Temp 97.7°F | Wt 207.5 lb

## 2019-11-22 DIAGNOSIS — R42 Dizziness and giddiness: Secondary | ICD-10-CM | POA: Diagnosis not present

## 2019-11-22 DIAGNOSIS — N179 Acute kidney failure, unspecified: Secondary | ICD-10-CM | POA: Diagnosis not present

## 2019-11-22 DIAGNOSIS — F419 Anxiety disorder, unspecified: Secondary | ICD-10-CM | POA: Diagnosis not present

## 2019-11-22 LAB — BASIC METABOLIC PANEL
BUN: 21 mg/dL (ref 6–23)
CO2: 27 mEq/L (ref 19–32)
Calcium: 9.5 mg/dL (ref 8.4–10.5)
Chloride: 102 mEq/L (ref 96–112)
Creatinine, Ser: 1.11 mg/dL (ref 0.40–1.50)
GFR: 67.47 mL/min (ref >60.00)
Glucose, Bld: 117 mg/dL — ABNORMAL HIGH (ref 70–99)
Potassium: 3.9 mEq/L (ref 3.5–5.1)
Sodium: 137 mEq/L (ref 135–145)

## 2019-11-22 MED ORDER — ESCITALOPRAM OXALATE 10 MG PO TABS: 10.0000 mg | ORAL_TABLET | Freq: Every day | ORAL | 3 refills | Status: DC

## 2019-11-22 NOTE — Progress Notes (Signed)
Subjective:     Patient ID: Jesse Pacheco, male   DOB: [birth year], 59 y.o.   MRN: [id number]  HPI   Jesse Pacheco is seen for ER follow-up.  He states that he on Tuesday night was getting off the couch and had some dizziness.  He described this as feeling off balance and somewhat like vertigo.  He went to the ER because of having prior cerebrovascular disease and concerns he may have some type of neurologic event.  He did not describe any focal weakness.  He had work-up there with labs along with MRI of the brain and CT angiogram of the neck and head with no acute abnormality.  He did have creatinine around 1.5 which is atypical for him.  He thinks he may have been slightly dehydrated.  ER notes, labs and x-rays reviewed.   He was prescribed meclizine which caused sedation.  At half dose he tolerated well.  His dizziness is actually slightly improved.  This did sound more like vertigo  He has history of chronic daily anxiety.  He thinks he may have some generalized anxiety type symptoms.  No crowd avoidance.  No history of social anxiety or panic disorder.  He is currently on fluoxetine but does not feel this is helping.  He had previous sexual side effects on sertraline but felt like that did control his anxiety symptoms much better  Has occasional sleep disturbance.  Takes trazodone low dosage  Past Medical History:  Diagnosis Date  . CVA (cerebrovascular accident) ([place])   . Depression   . HYPERTENSION 01/09/2009  . 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 Nocturia 01/09/2009  . VIRAL URI 03/22/2010   Past Surgical History:  Procedure Laterality Date  . APPENDECTOMY    . loop recorder explant  09/13/2019   MDT Reveal LINQ removal in office by Dr Rayann Heman for EOS  . LOOP RECORDER IMPLANT N/A 11/29/2014   Procedure: LOOP RECORDER IMPLANT;  Surgeon: Thompson Grayer, MD;  Location: [facility] CATH LAB;  Service: Cardiovascular;  Laterality: N/A;  . SPINE SURGERY  2006, 2009   laminectomy  . TEE WITHOUT  CARDIOVERSION N/A 11/29/2014   Procedure: TRANSESOPHAGEAL ECHOCARDIOGRAM (TEE);  Surgeon: Jerline Pain, MD;  Location: [facility] ENDOSCOPY;  Service: Cardiovascular;  Laterality: N/A;    reports that he has never smoked. He has never used smokeless tobacco. He reports current alcohol use. He reports that he does not use drugs. family history includes Heart disease (age of onset: 29) in his father; Hypertension in his mother; Stroke in his mother. No Known Allergies   Review of Systems  Constitutional: Negative for chills and fever.  Eyes: Negative for visual disturbance.  Respiratory: Negative for shortness of breath.   Cardiovascular: Negative for chest pain.  Gastrointestinal: Negative for abdominal pain.  Genitourinary: Negative for flank pain.  Neurological: Positive for dizziness. Negative for tremors, seizures, syncope, speech difficulty, light-headedness and headaches.  Psychiatric/Behavioral: Negative for suicidal ideas. The patient is nervous/anxious.        Objective:   Physical Exam Vitals reviewed.  Constitutional:      Appearance: Normal appearance.  Musculoskeletal:     Cervical back: Neck supple.  Lymphadenopathy:     Cervical: No cervical adenopathy.  Neurological:     Mental Status: He is alert.        Assessment:     #1 recent dizziness.  This sounded like vertigo.  Improved.  CNS imaging as above no acute changes.  No evidence for stroke.  Symptoms  improved at this time with nonfocal neuro exam  #2 history of chronic anxiety.  Questional generalized anxiety.  Unimproved on fluoxetine  #3 acute kidney injury with recent creatinine 1.5 which is up from his baseline of around 0.9.  Possible dehydration    Plan:     -Discontinue fluoxetine and we recommended after 3 to 4 days starting escitalopram 10 mg once daily  -Recheck BMP today  -We have asked that he give [place] some follow-up via MyChart in 3 weeks regarding his anxiety symptoms  -Follow-up immediately  for any recurrent dizziness or other concerns  Kristian Covey MD [facility]

## 2019-11-22 NOTE — Patient Instructions (Signed)

## 2019-12-16 ENCOUNTER — Other Ambulatory Visit: Payer: Self-pay | Admitting: Family Medicine

## 2019-12-20 ENCOUNTER — Other Ambulatory Visit: Payer: Self-pay

## 2019-12-21 ENCOUNTER — Ambulatory Visit (INDEPENDENT_AMBULATORY_CARE_PROVIDER_SITE_OTHER): Payer: 59 | Admitting: Family Medicine

## 2019-12-21 ENCOUNTER — Encounter: Payer: Self-pay | Admitting: Family Medicine

## 2019-12-21 ENCOUNTER — Ambulatory Visit: Payer: 59 | Admitting: Family Medicine

## 2019-12-21 VITALS — BP 126/72 | HR 66 | Temp 98.1°F | Wt 205.5 lb

## 2019-12-21 DIAGNOSIS — F419 Anxiety disorder, unspecified: Secondary | ICD-10-CM | POA: Diagnosis not present

## 2019-12-21 MED ORDER — SERTRALINE HCL 50 MG PO TABS: 50.0000 mg | ORAL_TABLET | Freq: Every day | ORAL | 5 refills | Status: DC

## 2019-12-21 MED ORDER — BUPROPION HCL ER (XL) 150 MG PO TB24: 150.0000 mg | ORAL_TABLET | Freq: Every day | ORAL | 5 refills | Status: DC

## 2019-12-21 NOTE — Progress Notes (Signed)
Subjective:     Patient ID: Jesse Pacheco, male   DOB: 1960-12-17, 59 y.o.   MRN: [id number]  HPI   Jesse Pacheco is seen to discuss anxiety issues. Refer to previous note for details  "He has history of chronic daily anxiety.  He thinks he may have some generalized anxiety type symptoms.  No crowd avoidance.  No history of social anxiety or panic disorder.  He is currently on fluoxetine but does not feel this is helping.  He had previous sexual side effects on sertraline but felt like that did control his anxiety symptoms much better"  We started Lexapro 10 mg daily. His anxiety symptoms were stable but he had tremendous fatigue with that. He states the fluoxetine did not cause any fatigue or sexual side effects but did not particularly help with his anxiety. As above, he did have good relief of anxiety when taking sertraline but had sexual side effects with decreased libido. He stopped the Lexapro on his own few days ago He does not feel particularly depressed at this time  Past Medical History:  Diagnosis Date  . CVA (cerebrovascular accident) (HCC)   . Depression   . HYPERTENSION 01/09/2009  . 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 Nocturia 01/09/2009  . VIRAL URI 03/22/2010   Past Surgical History:  Procedure Laterality Date  . APPENDECTOMY    . loop recorder explant  09/13/2019   MDT Reveal LINQ removal in office by Dr Johney Frame for EOS  . LOOP RECORDER IMPLANT N/A 11/29/2014   Procedure: LOOP RECORDER IMPLANT;  Surgeon: Hillis Range, MD;  Location: [facility] CATH LAB;  Service: Cardiovascular;  Laterality: N/A;  . SPINE SURGERY  2006, 2009   laminectomy  . TEE WITHOUT CARDIOVERSION N/A 11/29/2014   Procedure: TRANSESOPHAGEAL ECHOCARDIOGRAM (TEE);  Surgeon: Jake Bathe, MD;  Location: [facility] ENDOSCOPY;  Service: Cardiovascular;  Laterality: N/A;    reports that he has never smoked. He has never used smokeless tobacco. He reports current alcohol use. He reports that he does not use drugs. family  history includes Heart disease (age of onset: 66) in his father; Hypertension in his mother; Stroke in his mother. No Known Allergies  Review of Systems  Constitutional: Positive for fatigue. Negative for appetite change and unexpected weight change.  Respiratory: Negative for shortness of breath.   Cardiovascular: Negative for chest pain.  Psychiatric/Behavioral: Negative for dysphoric mood and suicidal ideas. The patient is nervous/anxious.        Objective:   Physical Exam Vitals reviewed.  Constitutional:      Appearance: Normal appearance.  Cardiovascular:     Rate and Rhythm: Normal rate and regular rhythm.  Pulmonary:     Effort: Pulmonary effort is normal.     Breath sounds: Normal breath sounds.  Neurological:     Mental Status: He is alert.  Psychiatric:        Mood and Affect: Mood normal.        Behavior: Behavior normal.        Thought Content: Thought content normal.        Assessment:     Anxiety symptoms. He has had past history of depression. Poor relief with fluoxetine. Increased fatigue with Lexapro    Plan:     -Discontinue Lexapro -Start back sertraline 50 mg once daily -We discussed concomitant use of Wellbutrin XL 150 mg once daily to try to offset any sexual side effects from the sertraline. -Schedule complete physical in 1 month to reassess  Jesse Pacheco  Dan Europe MD [facility]

## 2019-12-21 NOTE — Patient Instructions (Signed)
Stop the Lexapro and Fluoxetine  Start the Sertraline and Wellbutrin  Be sure to take the Wellbutrin in the morning.

## 2020-01-13 ENCOUNTER — Other Ambulatory Visit: Payer: Self-pay | Admitting: Family Medicine

## 2020-02-01 ENCOUNTER — Telehealth: Payer: Self-pay | Admitting: Family Medicine

## 2020-02-01 NOTE — Telephone Encounter (Signed)
Pt states he has been taking the sildenafil but last time someone sent in tadalafil. He would like sildenafil called in   Medication Refill: sildenafil [facility]acy: [facility]  FAX: [phone]

## 2020-02-01 NOTE — Telephone Encounter (Signed)
Pt has been informed that pharmacy requested that medication since insurance will cover it

## 2020-02-09 ENCOUNTER — Encounter: Payer: Self-pay | Admitting: Family Medicine

## 2020-02-09 ENCOUNTER — Ambulatory Visit (INDEPENDENT_AMBULATORY_CARE_PROVIDER_SITE_OTHER): Payer: 59 | Admitting: Family Medicine

## 2020-02-09 ENCOUNTER — Other Ambulatory Visit (INDEPENDENT_AMBULATORY_CARE_PROVIDER_SITE_OTHER): Payer: 59

## 2020-02-09 ENCOUNTER — Other Ambulatory Visit: Payer: Self-pay

## 2020-02-09 VITALS — BP 116/64 | HR 86 | Temp 98.1°F | Wt 200.2 lb

## 2020-02-09 DIAGNOSIS — Z0000 Encounter for general adult medical examination without abnormal findings: Secondary | ICD-10-CM

## 2020-02-09 LAB — LIPID PANEL
Cholesterol: 97 mg/dL (ref 0–200)
HDL: 29 mg/dL — ABNORMAL LOW (ref >39.00)
LDL Cholesterol: 34 mg/dL (ref 0–99)
NonHDL: 67.76
Total CHOL/HDL Ratio: 3
Triglycerides: 170 mg/dL — ABNORMAL HIGH (ref 0.0–149.0)
VLDL: 34 mg/dL (ref 0.0–40.0)

## 2020-02-09 LAB — PSA: PSA: 2.09 ng/mL (ref 0.10–4.00)

## 2020-02-09 LAB — HEMOGLOBIN A1C: Hgb A1c MFr Bld: 6.2 % (ref 4.6–6.5)

## 2020-02-09 NOTE — Patient Instructions (Signed)
Preventive Care 41-61 Years Old, Male Preventive care refers to lifestyle choices and visits with your health care provider that can promote health and wellness. This includes:  A yearly physical exam. This is also called an annual well check.  Regular dental and eye exams.  Immunizations.  Screening for certain conditions.  Healthy lifestyle choices, such as eating a healthy diet, getting regular exercise, not using drugs or products that contain nicotine and tobacco, and limiting alcohol use. What can I expect for my preventive care visit? Physical exam Your health care provider will check:  Height and weight. These may be used to calculate body mass index (BMI), which is a measurement that tells if you are at a healthy weight.  Heart rate and blood pressure.  Your skin for abnormal spots. Counseling Your health care provider may ask you questions about:  Alcohol, tobacco, and drug use.  Emotional well-being.  Home and relationship well-being.  Sexual activity.  Eating habits.  Work and work Statistician. What immunizations do I need?  Influenza (flu) vaccine  This is recommended every year. Tetanus, diphtheria, and pertussis (Tdap) vaccine  You may need a Td booster every 10 years. Varicella (chickenpox) vaccine  You may need this vaccine if you have not already been vaccinated. Zoster (shingles) vaccine  You may need this after age 64. Measles, mumps, and rubella (MMR) vaccine  You may need at least one dose of MMR if you were born in 1957 or later. You may also need a second dose. Pneumococcal conjugate (PCV13) vaccine  You may need this if you have certain conditions and were not previously vaccinated. Pneumococcal polysaccharide (PPSV23) vaccine  You may need one or two doses if you smoke cigarettes or if you have certain conditions. Meningococcal conjugate (MenACWY) vaccine  You may need this if you have certain conditions. Hepatitis A  vaccine  You may need this if you have certain conditions or if you travel or work in places where you may be exposed to hepatitis A. Hepatitis B vaccine  You may need this if you have certain conditions or if you travel or work in places where you may be exposed to hepatitis B. Haemophilus influenzae type b (Hib) vaccine  You may need this if you have certain risk factors. Human papillomavirus (HPV) vaccine  If recommended by your health care provider, you may need three doses over 6 months. You may receive vaccines as individual doses or as more than one vaccine together in one shot (combination vaccines). Talk with your health care provider about the risks and benefits of combination vaccines. What tests do I need? Blood tests  Lipid and cholesterol levels. These may be checked every 5 years, or more frequently if you are over 60 years old.  Hepatitis C test.  Hepatitis B test. Screening  Lung cancer screening. You may have this screening every year starting at age 43 if you have a 30-pack-year history of smoking and currently smoke or have quit within the past 15 years.  Prostate cancer screening. Recommendations will vary depending on your family history and other risks.  Colorectal cancer screening. All adults should have this screening starting at age 72 and continuing until age 2. Your health care provider may recommend screening at age 14 if you are at increased risk. You will have tests every 1-10 years, depending on your results and the type of screening test.  Diabetes screening. This is done by checking your blood sugar (glucose) after you have not eaten  for a while (fasting). You may have this done every 1-3 years.  Sexually transmitted disease (STD) testing. Follow these instructions at home: Eating and drinking  Eat a diet that includes fresh fruits and vegetables, whole grains, lean protein, and low-fat dairy products.  Take vitamin and mineral supplements as  recommended by your health care provider.  Do not drink alcohol if your health care provider tells you not to drink.  If you drink alcohol: ? Limit how much you have to 0-2 drinks a day. ? Be aware of how much alcohol is in your drink. In the [place] one drink equals one 12 oz bottle of beer (355 mL), one 5 oz glass of wine (148 mL), or one 1 oz glass of hard liquor (44 mL). Lifestyle  Take daily care of your teeth and gums.  Stay active. Exercise for at least 30 minutes on 5 or more days each week.  Do not use any products that contain nicotine or tobacco, such as cigarettes, e-cigarettes, and chewing tobacco. If you need help quitting, ask your health care provider.  If you are sexually active, practice safe sex. Use a condom or other form of protection to prevent STIs (sexually transmitted infections).  Talk with your health care provider about taking a low-dose aspirin every day starting at age 53. What's next?  Go to your health care provider once a year for a well check visit.  Ask your health care provider how often you should have your eyes and teeth checked.  Stay up to date on all vaccines. This information is not intended to replace advice given to you by your health care provider. Make sure you discuss any questions you have with your health care provider. Document Revised: 08/13/2018 Document Reviewed: 08/13/2018 Elsevier Patient Education  2020 [employer].

## 2020-02-09 NOTE — Progress Notes (Signed)
Established Patient Office Visit  Subjective:  Patient ID: Jesse Pacheco, male    DOB: 1960/11/10  Age: 59 y.o. MRN: [id number]  CC:  Chief Complaint  Patient presents with  . Annual Exam    Pt has no new concerns     HPI Jesse Pacheco presents for physical exam.  Generally doing well.  Has had some recurrent depression currently stable on sertraline and Wellbutrin.  He is likely going to be moving to [place] soon.  He has a girlfriend who lives there.  He has history of BPH.  He is currently symptomatically stable on Flomax and finasteride.  He has had prior history of stroke remotely.  He had some labs from ER visit back in March and these were reviewed.  Needs follow-up lipids.  He remains on Lipitor.  Remains on Plavix.  No history of shingles vaccine.  Due for repeat colonoscopy.  Has had Covid vaccine.  Tetanus up-to-date.  Past Medical History:  Diagnosis Date  . CVA (cerebrovascular accident) (HCC)   . Depression   . HYPERTENSION 01/09/2009  . 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 Nocturia 01/09/2009  . VIRAL URI 03/22/2010    Past Surgical History:  Procedure Laterality Date  . APPENDECTOMY    . loop recorder explant  09/13/2019   MDT Reveal LINQ removal in office by Dr Johney Frame for EOS  . LOOP RECORDER IMPLANT N/A 11/29/2014   Procedure: LOOP RECORDER IMPLANT;  Surgeon: Hillis Range, MD;  Location: [facility] CATH LAB;  Service: Cardiovascular;  Laterality: N/A;  . SPINE SURGERY  2006, 2009   laminectomy  . TEE WITHOUT CARDIOVERSION N/A 11/29/2014   Procedure: TRANSESOPHAGEAL ECHOCARDIOGRAM (TEE);  Surgeon: Jake Bathe, MD;  Location: [facility] ENDOSCOPY;  Service: Cardiovascular;  Laterality: N/A;    Family History  Problem Relation Age of Onset  . Hypertension Mother   . Stroke Mother   . Heart disease Father 23       CABG    Social History   Socioeconomic History  . Marital status: Single    Spouse name: Not on file  . Number of children: Not on  file  . Years of education: Not on file  . Highest education level: Not on file  Occupational History  . Not on file  Tobacco Use  . Smoking status: Never Smoker  . Smokeless tobacco: Never Used  Substance and Sexual Activity  . Alcohol use: Yes    Comment: drinks 3-4 times a week  . Drug use: No  . Sexual activity: Not on file  Other Topics Concern  . Not on file  Social History Narrative  . Not on file   Social Determinants of Health   Financial Resource Strain:   . Difficulty of Paying Living Expenses:   Food Insecurity:   . Worried About Programme researcher, broadcasting/film/video in the Last Year:   . Barista in the Last Year:   Transportation Needs:   . Freight forwarder (Medical):   Marland Kitchen Lack of Transportation (Non-Medical):   Physical Activity:   . Days of Exercise per Week:   . Minutes of Exercise per Session:   Stress:   . Feeling of Stress :   Social Connections:   . Frequency of Communication with Friends and Family:   . Frequency of Social Gatherings with Friends and Family:   . Attends Religious Services:   . Active Member of Clubs or Organizations:   . Attends Banker  Meetings:   Marland Kitchen Marital Status:   Intimate Partner Violence:   . Fear of Current or Ex-Partner:   . Emotionally Abused:   Marland Kitchen Physically Abused:   . Sexually Abused:     Outpatient Medications Prior to Visit  Medication Sig Dispense Refill  . atorvastatin (LIPITOR) 80 MG tablet TAKE 1 TABLET BY MOUTH EVERY DAY 30 tablet 11  . buPROPion (WELLBUTRIN XL) 150 MG 24 hr tablet TAKE 1 TABLET BY MOUTH EVERY DAY 90 tablet 1  . chlorthalidone (HYGROTON) 25 MG tablet TAKE 1 TABLET BY MOUTH EVERY DAY 30 tablet 11  . clopidogrel (PLAVIX) 75 MG tablet TAKE 1 TABLET BY MOUTH EVERY DAY 30 tablet 11  . finasteride (PROSCAR) 5 MG tablet TAKE 1 TABLET BY MOUTH EVERY DAY 30 tablet 5  . lisinopril (ZESTRIL) 40 MG tablet TAKE 1 TABLET BY MOUTH EVERY DAY 30 tablet 11  . Multiple Vitamin (MULITIVITAMIN WITH  MINERALS) TABS Take 1 tablet by mouth daily.    . sertraline (ZOLOFT) 50 MG tablet TAKE 1 TABLET BY MOUTH EVERY DAY 90 tablet 1  . tadalafil (CIALIS) 20 MG tablet Take one every other day as needed for erectile dysfunction 10 tablet 5  . tamsulosin (FLOMAX) 0.4 MG CAPS capsule TAKE 1 CAPSULE BY MOUTH EVERY DAY 30 capsule 5  . traZODone (DESYREL) 50 MG tablet TAKE 1 TABLET BY MOUTH EVERYDAY AT BEDTIME (Patient taking differently: Take 50 mg by mouth at bedtime as needed for sleep. ) 90 tablet 0   No facility-administered medications prior to visit.    No Known Allergies  ROS Review of Systems  Constitutional: Negative for activity change, appetite change, fatigue and fever.  HENT: Negative for congestion, ear pain and trouble swallowing.   Eyes: Negative for pain and visual disturbance.  Respiratory: Negative for cough, shortness of breath and wheezing.   Cardiovascular: Negative for chest pain and palpitations.  Gastrointestinal: Negative for abdominal distention, abdominal pain, blood in stool, constipation, diarrhea, nausea, rectal pain and vomiting.  Endocrine: Negative for polydipsia and polyuria.  Genitourinary: Negative for dysuria, hematuria and testicular pain.  Musculoskeletal: Negative for arthralgias and joint swelling.  Skin: Negative for rash.  Neurological: Negative for dizziness, syncope and headaches.  Hematological: Negative for adenopathy.  Psychiatric/Behavioral: Negative for confusion and dysphoric mood.      Objective:    Physical Exam  Constitutional: He is oriented to person, place, and time. He appears well-developed and well-nourished. No distress.  HENT:  Head: Normocephalic and atraumatic.  Right Ear: External ear normal.  Left Ear: External ear normal.  Mouth/Throat: Oropharynx is clear and moist.  Eyes: Pupils are equal, round, and reactive to light. Conjunctivae and EOM are normal.  Neck: No thyromegaly present.  Cardiovascular: Normal rate,  regular rhythm and normal heart sounds.  No murmur heard. Pulmonary/Chest: No respiratory distress. He has no wheezes. He has no rales.  Abdominal: Soft. Bowel sounds are normal. He exhibits no distension and no mass. There is no abdominal tenderness. There is no rebound and no guarding.  Musculoskeletal:        General: No edema.     Cervical back: Normal range of motion and neck supple.  Lymphadenopathy:    He has no cervical adenopathy.  Neurological: He is alert and oriented to person, place, and time. He displays normal reflexes. No cranial nerve deficit.  Skin: No rash noted.  Psychiatric: He has a normal mood and affect.    BP 116/64 (BP Location: Left Arm, Patient  Position: Sitting, Cuff Size: Normal)   Pulse 86   Temp 98.1 F (36.7 C) (Temporal)   Wt 200 lb 3.2 oz (90.8 kg)   SpO2 97%   BMI 30.44 kg/m  Wt Readings from Last 3 Encounters:  02/09/20 200 lb 3.2 oz (90.8 kg)  12/21/19 205 lb 8 oz (93.2 kg)  11/22/19 207 lb 8 oz (94.1 kg)     Health Maintenance Due  Topic Date Due  . COLONOSCOPY  11/11/2018    There are no preventive care reminders to display for this patient.  Lab Results  Component Value Date   TSH 1.42 10/05/2018   Lab Results  Component Value Date   WBC 6.7 11/17/2019   HGB 16.3 11/17/2019   HCT 48.0 11/17/2019   MCV 87.7 11/17/2019   PLT 231 11/17/2019   Lab Results  Component Value Date   NA 137 11/22/2019   K 3.9 11/22/2019   CO2 27 11/22/2019   GLUCOSE 117 (H) 11/22/2019   BUN 21 11/22/2019   CREATININE 1.11 11/22/2019   BILITOT 0.8 11/17/2019   ALKPHOS 63 11/17/2019   AST 27 11/17/2019   ALT 39 11/17/2019   PROT 7.3 11/17/2019   ALBUMIN 4.2 11/17/2019   CALCIUM 9.5 11/22/2019   ANIONGAP 14 11/17/2019   GFR 67.47 11/22/2019   Lab Results  Component Value Date   CHOL 131 10/05/2018   Lab Results  Component Value Date   HDL 46.10 10/05/2018   Lab Results  Component Value Date   LDLCALC 70 10/05/2018   Lab Results   Component Value Date   TRIG 75.0 10/05/2018   Lab Results  Component Value Date   CHOLHDL 3 10/05/2018   Lab Results  Component Value Date   HGBA1C 5.4 10/21/2016      Assessment & Plan:   Problem List Items Addressed This Visit    None    Visit Diagnoses    Physical exam    -  Primary   Relevant Orders   Lipid panel   PSA   Hemoglobin A1c   Ambulatory referral to Gastroenterology    We discussed health maintenance issues including the following  -Needs repeat colonoscopy.  He would like to go ahead and get scheduled for here if he can get this in a timely manner before he leaves for [place] -Recommend Shingrix vaccine and he will check on insurance coverage -Check labs as above.  We did not check CBC or basic chemistries since these were just done  No orders of the defined types were placed in this encounter.   Follow-up: No follow-ups on file.    Carolann Littler, MD

## 2020-02-27 ENCOUNTER — Other Ambulatory Visit: Payer: Self-pay | Admitting: Family Medicine

## 2020-03-23 ENCOUNTER — Other Ambulatory Visit: Payer: Self-pay | Admitting: Family Medicine

## 2020-04-06 ENCOUNTER — Other Ambulatory Visit: Payer: Self-pay | Admitting: Family Medicine

## 2020-07-15 ENCOUNTER — Other Ambulatory Visit: Payer: Self-pay | Admitting: Family Medicine

## 2020-08-16 ENCOUNTER — Other Ambulatory Visit: Payer: Self-pay | Admitting: Family Medicine

## 2020-10-05 ENCOUNTER — Other Ambulatory Visit: Payer: Self-pay | Admitting: Family Medicine

## 2021-02-26 ENCOUNTER — Telehealth: Payer: Self-pay | Admitting: Family Medicine

## 2021-02-26 NOTE — Telephone Encounter (Signed)
Letter done.   See under "letter" section of chart.  I also sent electronic copy to pt.

## 2021-02-26 NOTE — Telephone Encounter (Signed)
Patient faxed a medical records release form last week for [employer] Physical form and needs it back today or he can't go back to work tomorrow.
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
## 2021-02-27 NOTE — Telephone Encounter (Signed)
Letter has been printed and faxed to the requested number on the form.  Left a detailed message on verified voice mail.

## 2021-03-09 ENCOUNTER — Other Ambulatory Visit: Payer: Self-pay | Admitting: Family Medicine

## 2021-03-18 ENCOUNTER — Other Ambulatory Visit: Payer: Self-pay | Admitting: Family Medicine

## 2021-04-19 ENCOUNTER — Other Ambulatory Visit: Payer: Self-pay | Admitting: Family Medicine

## 2021-07-14 ENCOUNTER — Other Ambulatory Visit: Payer: Self-pay | Admitting: Family Medicine

## 2021-10-25 ENCOUNTER — Other Ambulatory Visit: Payer: Self-pay | Admitting: Family Medicine

## 2021-11-20 ENCOUNTER — Telehealth: Payer: Self-pay | Admitting: Family Medicine

## 2021-11-20 NOTE — Telephone Encounter (Signed)
Lvm for patient to schedule cpe ?

## 2022-01-20 ENCOUNTER — Other Ambulatory Visit: Payer: Self-pay | Admitting: Family Medicine

## 2022-06-05 ENCOUNTER — Other Ambulatory Visit: Payer: Self-pay | Admitting: Family Medicine
